# Patient Record
Sex: Male | Born: 1935
Health system: Southern US, Community
[De-identification: ages and names within clinical notes are randomized; demographics above are authoritative.]

## PROBLEM LIST (undated history)

## (undated) DIAGNOSIS — Z923 Personal history of irradiation: Secondary | ICD-10-CM

## (undated) DIAGNOSIS — F0390 Unspecified dementia without behavioral disturbance: Secondary | ICD-10-CM

## (undated) DIAGNOSIS — E7800 Pure hypercholesterolemia, unspecified: Secondary | ICD-10-CM

## (undated) DIAGNOSIS — I8390 Asymptomatic varicose veins of unspecified lower extremity: Secondary | ICD-10-CM

## (undated) DIAGNOSIS — M1990 Unspecified osteoarthritis, unspecified site: Secondary | ICD-10-CM

## (undated) DIAGNOSIS — C801 Malignant (primary) neoplasm, unspecified: Secondary | ICD-10-CM

## (undated) DIAGNOSIS — M47812 Spondylosis without myelopathy or radiculopathy, cervical region: Secondary | ICD-10-CM

## (undated) DIAGNOSIS — R531 Weakness: Secondary | ICD-10-CM

## (undated) DIAGNOSIS — Z8719 Personal history of other diseases of the digestive system: Secondary | ICD-10-CM

## (undated) DIAGNOSIS — R0789 Other chest pain: Secondary | ICD-10-CM

## (undated) DIAGNOSIS — M549 Dorsalgia, unspecified: Secondary | ICD-10-CM

## (undated) DIAGNOSIS — R42 Dizziness and giddiness: Secondary | ICD-10-CM

## (undated) DIAGNOSIS — R0602 Shortness of breath: Secondary | ICD-10-CM

## (undated) DIAGNOSIS — I447 Left bundle-branch block, unspecified: Secondary | ICD-10-CM

## (undated) DIAGNOSIS — R011 Cardiac murmur, unspecified: Secondary | ICD-10-CM

## (undated) DIAGNOSIS — R5383 Other fatigue: Secondary | ICD-10-CM

## (undated) DIAGNOSIS — Z8619 Personal history of other infectious and parasitic diseases: Secondary | ICD-10-CM

## (undated) DIAGNOSIS — R9439 Abnormal result of other cardiovascular function study: Secondary | ICD-10-CM

## (undated) DIAGNOSIS — M489 Spondylopathy, unspecified: Secondary | ICD-10-CM

## (undated) HISTORY — DX: Dizziness and giddiness: R42

## (undated) HISTORY — DX: Dorsalgia, unspecified: M54.9

## (undated) HISTORY — DX: Left bundle-branch block, unspecified: I44.7

## (undated) HISTORY — DX: Other chest pain: R07.89

## (undated) HISTORY — PX: TONSILLECTOMY AND ADENOIDECTOMY: SHX28

## (undated) HISTORY — DX: Personal history of other infectious and parasitic diseases: Z86.19

## (undated) HISTORY — DX: Other fatigue: R53.83

## (undated) HISTORY — DX: Weakness: R53.1

## (undated) HISTORY — PX: HEMORRHOID SURGERY: SHX153

## (undated) HISTORY — DX: Pure hypercholesterolemia, unspecified: E78.00

## (undated) HISTORY — DX: Unspecified osteoarthritis, unspecified site: M19.90

## (undated) HISTORY — DX: Abnormal result of other cardiovascular function study: R94.39

## (undated) HISTORY — DX: Asymptomatic varicose veins of unspecified lower extremity: I83.90

## (undated) HISTORY — DX: Spondylosis without myelopathy or radiculopathy, cervical region: M47.812

## (undated) HISTORY — DX: Spondylopathy, unspecified: M48.9

## (undated) HISTORY — DX: Shortness of breath: R06.02

---

## 1993-08-31 HISTORY — PX: BACK SURGERY: SHX140

## 1998-04-24 ENCOUNTER — Emergency Department (HOSPITAL_COMMUNITY): Admission: EM | Admit: 1998-04-24 | Discharge: 1998-04-24 | Payer: Self-pay | Admitting: Emergency Medicine

## 1998-05-01 ENCOUNTER — Emergency Department (HOSPITAL_COMMUNITY): Admission: EM | Admit: 1998-05-01 | Discharge: 1998-05-01 | Payer: Self-pay | Admitting: Emergency Medicine

## 2002-03-07 ENCOUNTER — Inpatient Hospital Stay (HOSPITAL_COMMUNITY): Admission: EM | Admit: 2002-03-07 | Discharge: 2002-03-09 | Payer: Self-pay | Admitting: Emergency Medicine

## 2002-03-07 ENCOUNTER — Encounter: Payer: Self-pay | Admitting: Emergency Medicine

## 2002-03-08 ENCOUNTER — Encounter: Payer: Self-pay | Admitting: Cardiology

## 2002-03-09 ENCOUNTER — Encounter: Payer: Self-pay | Admitting: Cardiology

## 2002-03-15 ENCOUNTER — Encounter: Payer: Self-pay | Admitting: Emergency Medicine

## 2002-03-15 ENCOUNTER — Emergency Department (HOSPITAL_COMMUNITY): Admission: EM | Admit: 2002-03-15 | Discharge: 2002-03-15 | Payer: Self-pay | Admitting: Emergency Medicine

## 2002-03-24 ENCOUNTER — Ambulatory Visit (HOSPITAL_COMMUNITY): Admission: RE | Admit: 2002-03-24 | Discharge: 2002-03-24 | Payer: Self-pay | Admitting: Infectious Diseases

## 2002-03-24 ENCOUNTER — Encounter: Payer: Self-pay | Admitting: Infectious Diseases

## 2002-03-24 ENCOUNTER — Encounter: Admission: RE | Admit: 2002-03-24 | Discharge: 2002-03-24 | Payer: Self-pay | Admitting: Infectious Diseases

## 2003-09-01 HISTORY — PX: CARDIAC CATHETERIZATION: SHX172

## 2004-04-25 DIAGNOSIS — R9439 Abnormal result of other cardiovascular function study: Secondary | ICD-10-CM

## 2004-04-25 HISTORY — DX: Abnormal result of other cardiovascular function study: R94.39

## 2004-05-01 ENCOUNTER — Inpatient Hospital Stay (HOSPITAL_BASED_OUTPATIENT_CLINIC_OR_DEPARTMENT_OTHER): Admission: RE | Admit: 2004-05-01 | Discharge: 2004-05-01 | Payer: Self-pay | Admitting: Cardiology

## 2005-07-31 ENCOUNTER — Emergency Department (HOSPITAL_COMMUNITY): Admission: EM | Admit: 2005-07-31 | Discharge: 2005-07-31 | Payer: Self-pay | Admitting: Emergency Medicine

## 2005-08-19 ENCOUNTER — Ambulatory Visit (HOSPITAL_COMMUNITY): Admission: RE | Admit: 2005-08-19 | Discharge: 2005-08-19 | Payer: Self-pay | Admitting: Cardiology

## 2007-10-12 ENCOUNTER — Encounter: Admission: RE | Admit: 2007-10-12 | Discharge: 2007-10-12 | Payer: Self-pay | Admitting: Cardiology

## 2010-01-24 ENCOUNTER — Encounter: Admission: RE | Admit: 2010-01-24 | Discharge: 2010-01-24 | Payer: Self-pay | Admitting: Cardiology

## 2010-07-22 ENCOUNTER — Ambulatory Visit: Payer: Self-pay | Admitting: Cardiology

## 2010-12-30 ENCOUNTER — Other Ambulatory Visit (INDEPENDENT_AMBULATORY_CARE_PROVIDER_SITE_OTHER): Payer: Medicare Other | Admitting: *Deleted

## 2010-12-30 ENCOUNTER — Other Ambulatory Visit: Payer: Self-pay | Admitting: Cardiology

## 2010-12-30 DIAGNOSIS — E7800 Pure hypercholesterolemia, unspecified: Secondary | ICD-10-CM

## 2010-12-30 DIAGNOSIS — E559 Vitamin D deficiency, unspecified: Secondary | ICD-10-CM

## 2010-12-30 DIAGNOSIS — Z79899 Other long term (current) drug therapy: Secondary | ICD-10-CM

## 2010-12-30 DIAGNOSIS — D689 Coagulation defect, unspecified: Secondary | ICD-10-CM

## 2010-12-30 DIAGNOSIS — Z125 Encounter for screening for malignant neoplasm of prostate: Secondary | ICD-10-CM

## 2010-12-30 LAB — CBC WITH DIFFERENTIAL/PLATELET
Basophils Absolute: 0 10*3/uL (ref 0.0–0.1)
Basophils Relative: 0.7 % (ref 0.0–3.0)
Eosinophils Relative: 6.9 % — ABNORMAL HIGH (ref 0.0–5.0)
Hemoglobin: 13.5 g/dL (ref 13.0–17.0)
Lymphocytes Relative: 22.7 % (ref 12.0–46.0)
Lymphs Abs: 1.4 10*3/uL (ref 0.7–4.0)
MCHC: 34 g/dL (ref 30.0–36.0)
MCV: 89.6 fl (ref 78.0–100.0)
Monocytes Absolute: 0.6 10*3/uL (ref 0.1–1.0)
Monocytes Relative: 10.1 % (ref 3.0–12.0)
Neutrophils Relative %: 59.6 % (ref 43.0–77.0)
RDW: 14.5 % (ref 11.5–14.6)
WBC: 6.3 10*3/uL (ref 4.5–10.5)

## 2010-12-30 LAB — LIPID PANEL
Cholesterol: 163 mg/dL (ref 0–200)
LDL Cholesterol: 103 mg/dL — ABNORMAL HIGH (ref 0–99)
Total CHOL/HDL Ratio: 4

## 2010-12-30 LAB — HEPATIC FUNCTION PANEL
AST: 22 U/L (ref 0–37)
Total Bilirubin: 0.7 mg/dL (ref 0.3–1.2)
Total Protein: 6.8 g/dL (ref 6.0–8.3)

## 2010-12-30 LAB — BASIC METABOLIC PANEL
Calcium: 9 mg/dL (ref 8.4–10.5)
Creatinine, Ser: 1 mg/dL (ref 0.4–1.5)
Potassium: 4.7 mEq/L (ref 3.5–5.1)
Sodium: 140 mEq/L (ref 135–145)

## 2010-12-30 LAB — PSA: PSA: 2.72 ng/mL (ref 0.10–4.00)

## 2011-01-02 ENCOUNTER — Ambulatory Visit: Payer: Self-pay | Admitting: Cardiology

## 2011-01-06 ENCOUNTER — Encounter: Payer: Self-pay | Admitting: *Deleted

## 2011-01-06 DIAGNOSIS — E7800 Pure hypercholesterolemia, unspecified: Secondary | ICD-10-CM

## 2011-01-06 DIAGNOSIS — R42 Dizziness and giddiness: Secondary | ICD-10-CM

## 2011-01-06 DIAGNOSIS — I447 Left bundle-branch block, unspecified: Secondary | ICD-10-CM

## 2011-01-16 NOTE — Cardiovascular Report (Signed)
NAME:  LAMIN, CHANDLEY NO.:  [id number]   MEDICAL RECORD NO.:  [id number]                   PATIENT TYPE:  OIB   LOCATION:  6501                                 FACILITY:  [facility]   PHYSICIAN:  [name]. Swaziland, M.D.               DATE OF BIRTH:  11-25-1927   DATE OF PROCEDURE:  05/01/2004  DATE OF DISCHARGE:  05/01/2004                              CARDIAC CATHETERIZATION   INDICATION FOR PROCEDURE:  A 77 year old white male with history of  hypercholesterolemia and left bundle branch block.  He presents with  symptoms of exertional fatigue, weakness and near syncope.  Adenosine  Cardiolite study was suggestive of inferior septal ischemia.   PROCEDURE:  Left heart catheterization, coronary and left ventricular  angiography.   EQUIPMENT:  4 French 4 cm right and left Judkins catheter, 4 French pigtail  catheter, 4 French arterial sheath.   MEDICATIONS:  Local anesthesia with 1% Xylocaine.   CONTRAST:  100 mL of Omnipaque.   HEMODYNAMIC DATA:  Aortic pressure 123/61 with mean of 86 mmHg.  Left  ventricular pressure was 119 with EDP of 10.   ANGIOGRAPHIC DATA:  The left coronary artery arises and distributes  normally.  The left main coronary artery is normal.   The left anterior descending artery and its branches are normal.   The left circumflex coronary artery is normal.   The right coronary artery is a dominant vessel and is normal.   LEFT VENTRICULAR ANGIOGRAPHY:  Performed in the RAO view demonstrates mild  left ventricular enlargement.  There is mild hypokinesia anteroseptal with  overall low normal left ventricular systolic function.  Ejection fraction is  estimated at 50%.   FINAL INTERPRETATION:  1.  Normal coronary anatomy.  2.  Low normal left ventricular function.                                               [name]. Swaziland, M.D.    PMJ/MEDQ  D:  05/01/2004  T:  05/02/2004  Job:  [id number]   cc:   Cassell Clement, M.D.  [address]  [place]  [place] [address]  Fax: [phone]

## 2011-01-16 NOTE — Discharge Summary (Signed)
NAME:  Jonathan Graham, STRAWSER                      ACCOUNT NO.:  [id number]   MEDICAL RECORD NO.:  [id number]                   PATIENT TYPE:  INP   LOCATION:  3703                                 FACILITY:  [facility]   PHYSICIAN:  Clovis Pu. Patty Sermons, M.D.           DATE OF BIRTH:  10/04/1927   DATE OF ADMISSION:  03/07/2002  DATE OF DISCHARGE:  03/09/2002                                 DISCHARGE SUMMARY   FINAL DIAGNOSES:  1. Fever of unknown etiology, resolved.  2. Hypotension secondary to fever and hypovolemia.  3. Old left bundle branch block.  4. Cervical spondylosis.  5. History of hypercholesterolemia.  6. Low back pain and leg pain, with exacerbation during periods of fever.   OPERATIONS PERFORMED:  Spiral CT scan of the chest.   HISTORY:  This is a 77 year old, married, Caucasian gentleman, admitted  through the emergency room.  He presented on the emergency room on the  morning of admission with severe excruciating low back pain, thigh pain, and  shaking chills.  He was initially afebrile in the emergency room and then on  retaking his temperature, it was noted that he was 102 degrees.  This was  associated with tachycardia and hypotension.  The patient has been in  generally good health all of his life.  He had felt well until about a week  prior to admission when he had fever and leg aches, but no other symptoms.  Two days later he felt better and was able to go and help attend at a  friend's funeral.  On the day after the funeral he again felt exhausted, and  over the next several days he continued to complain of weakness and malaise  but no localizing symptoms otherwise.  On the morning of admission, because  of worsening back, thigh, and leg pain, he came to the emergency room.   There is no history of any tick bite or rash.  He has had some mosquito  bites and has been doing some outside yard work creating a new series of  ponds in his back yard.  He has had no  headache or cough.  He has not had  any change in bowel habits.  He has had a decreased appetite.  He had dry  heaves after being given Dilaudid in the emergency room.  He has felt  lightheaded and presyncopal at times.   HOME MEDICATIONS:  The patient had been on Lipitor 20 mg daily, but has  taken none for the past week, and prior to that had been taking it only  sporadically.  The night prior to admission he took some quinine for leg  cramps.  He has a past history of using tetracycline for rosacea, but has  not taken any recently.   PAST MEDICAL HISTORY:  The patient has a history of known left bundle branch  block.  He had a Cardiolite Adenosine stress test October 2001,  showing  normal LV function, 54% ejection fraction, and no ischemia.  He underwent an  MRI of his cervical spine on 01/02/02 because of neck pain, and was found to  have multilevel spondylosis, and a large central spondylitic spur at C3-C4,  which produced mild central canal stenosis but no cord compression.   PHYSICAL EXAMINATION:  VITAL SIGNS:  Blood pressure is 80/50 in the right  arm, and 85/50 in the left arm.  Pulse is 100, temperature 102.  GENERAL:  He is an alert, pleasant Caucasian gentleman who is cooperative.  SKIN:  Clear. No rash. No evidence of any recent insect bites.  HEAD AND NECK:  Sclerae clear.  Conjunctivae normal, not injected.  Neck is  supple. No lymphadenopathy.  Carotids normal.  Thyroid normal.  CHEST:  Clear.  HEART:  Grade 2/6 apical systolic murmur, suggesting mitral regurgitation.  There is no gallop or rub.  ABDOMEN:  Soft and nontender.  The liver and spleen are not felt.  There is  no abdominal mass.  EXTREMITIES:  Good femoral and pedal pulses. No phlebitis, no edema.  He  does have known varicose veins.   DATA REVIEWED:  Initial workup in the emergency room included white count of  9800, with 91% polys.  Potassium was 2.7, BUN 23, blood sugar 110.  Clotting  studies were  normal.  Hematocrit was 42. CK-MB and troponins were negative.  Urine specific gravity was greater than 1.040, consistent with dehydration.  Sed rate was 11.  Blood cultures and urine cultures were ordered.   EKG showed complete left bundle branch block.   Chest x-ray showed no acute infiltrates.   The patient had already in the emergency room had an abdominal and pelvic CT  scan which were negative for abdominal aneurysm.   HOSPITAL COURSE:  The patient was admitted.  After blood and urine cultures  were drawn, he was begun empirically on IV Rocephin.  A 2D echocardiogram  was requested because of the heart murmur and fever.   By the following day he was feeling better.  He had no further shaking  chills and was afebrile. He felt stronger.  There was no rash or headache.  There were no GI or GU symptoms.  Blood pressure gradually improved, and  upright was 110/70.  Telemetry was normal, showing normal sinus rhythm and  no further tachycardia.  His white count was normal and hemoglobin was noted  to be slightly lower.  IV fluids were continued because of mild hypotension.   By 03/09/02 he was having only a low-grade fever and mild chest congestion.  His chest x-ray did show right middle lobe and left lower lobe atelectasis  on 03/08/02, but his blood and urine cultures were negative.  White count was  down to 6000 on 03/09/02, and oxygen saturation was 96% on room air.  The two-  dimensional echocardiogram showed good left ventricular function, mild  aortic sclerosis, and no evidence of vegetations.  The patient did complain  of slight pleuritic chest pain, and so prior to discharge we did a spiral CT  scan of the chest which was negative for pulmonary emboli or other  pathology.   The patient was discharged home to be followed closely on an outpatient  basis.  He was switched to Zithromax and Humibid at discharge and was also given SBE precautions because of the mild aortic stenosis  found on  echocardiogram.   LABORATORY DATA:  White count on admission 9800, on  discharge 6000.  Sedimentation rate 11. Blood sugars were 110, 131, and 137.  Liver function  studies were normal, except for albumin of 3.2 and total bilirubin of 1.6.  Cardiac enzymes were negative.  BUN on admission was 23, on discharge 8.  Lipid panel showed cholesterol of 109, triglycerides 50, HDL 31, LDL 68.  C-  reactive protein was high at 1.6 with a normal being up to 0.8.  Urinalysis  was negative except for high specific gravity of 1.040.  Blood cultures and  urine cultures were no growth.   The patient was discharged on 03/09/02, to continue quiet activity at home.  He will be on a low-cholesterol diet.  He is to use incentive spirometry  four times a day to correct the basilar atelectasis. He is to see Dr.  Patty Sermons in two weeks.   DISCHARGE MEDICATIONS:  1. Zithromax Z-Pak as directed.  2. Humibid LA 600 mg 1 twice a day.  3. Aspirin as needed for pain or discomfort.  4. Tylenol, 2 tablets as needed for pain or fever.   CONDITION ON DISCHARGE:  Improved.                                                 Thomas A. Patty Sermons, M.D.    TAB/MEDQ  D:  04/01/2002  T:  04/02/2002  Job:  [id number]

## 2011-01-16 NOTE — H&P (Signed)
[facility]  Patient:    Jonathan Graham, Jonathan Graham Visit Number: [id number] MRN: [id number]          Service Type: MED Location: [phone] Attending Physician:  Rudean Hitt Dictated by:   Clovis Pu Patty Sermons, M.D. Admit Date:  03/07/2002                           History and Physical  CHIEF COMPLAINT:  Fever and lower abdominal and leg aching.  HISTORY OF PRESENT ILLNESS:  This is a 77 year old married Caucasian gentleman admitted through the emergency room.  He presented to the emergency room this morning with severe, excruciating low back pain and thigh pain and shaking chills.  He was initially afebrile in the emergency room, and then on retaking his temperature, it spiked to 102 degrees.  This was associated with hypotension and tachycardia.  The patient had been in good health until about a week ago when he had fever and leg aches but no other symptoms.  Two days later, he felt better and was able to go to a friends funeral on July 3.  On July 4, he again felt exhausted after going out to breakfast with his wife. Over the weekend, he continued to feel weak.  This morning, because of worsening back, thigh, and leg pain, he came to the emergency room.  There is no history of any tick bite or rash.  He has been doing some outside work and has had some mosquito bites.  He has not had any headache or cough. He has had no change in bowel habits.  He has had a slight decrease in appetite.  He has had no nausea until after he was given IV Dilaudid in the emergency room, then he had brief dry heaves.  He has had brief shaking chills and felt presyncopal.  HOME MEDICATIONS:  The patient has been on Lipitor 20 mg daily but has taken none for the past week and has been somewhat sporadic in taking it.  He took some quinine last night for leg cramps.  He has a past history of tetracycline that he takes for rosacea but has not used that  recently.  ALLERGIES:  No known drug allergies.  FAMILY HISTORY:  His father died at age 46 of heart disease and stroke, and mother died at 46 of heart disease and heart failure.  He had a brother and sister killed in an auto accident.  He has two children living and well.  SOCIAL HISTORY:  He is retired.  He has his own [employer].  Recreation is working in his garden and doing some fishing.  He does not use alcohol.  He quit smoking in 1953.  He does drink several cups of coffee a day.  PAST MEDICAL HISTORY:  Includes two ruptured disks in his lower back, 1995, Dr. Otelia Sergeant.  He has a history of hemorrhoidectomy.  He has a history of hospitalization for chest pain in 1975.  He has a known left bundle branch block.  He had an adenosine Cardiolite stress test June 07, 2000, showing normal LV function and no ischemia.  Ejection fraction was 54%.  The patient had an MRI of the cervical spine on Jan 02, 2002, because of neck pain and was found to have multilevel spondylosis and a dominant finding at C3-C4 where there was a large central spondylitic spur which produced mild central canal stenosis but no  cord compression.  REVIEW OF SYSTEMS:  The remainder of the Review of Systems is negative in detail.  PHYSICAL EXAMINATION:  VITAL SIGNS:  Blood pressure 180/50 right arm, 85/50 left arm.  Pulse 100, temperature 102.  GENERAL:  This is an alert, flushed, Caucasian gentleman who is cooperative.  SKIN:  Clear.  There is no skin rash.  There is no evidence of an insect bite.  HEENT:  Pupils are equal, round and reactive to light and accommodation. Sclerae are clear.  Mouth and pharynx are normal.  NECK:  Supple.  There is no lymphadenopathy.  Carotids are normal, thyroid normal.  CHEST:  Clear to percussion and auscultation.  HEART:  Grade 2/6 apical systolic murmur suggesting mitral regurgitation. There is no gallop or rub.  ABDOMEN:  Soft and nontender.  There is no  hepatosplenomegaly or mass.  EXTREMITIES:  Femoral pulses are good.  Pedal pulses are good.  There is no phlebitis or edema.  He does have some varicose veins.  DIAGNOSTIC DATA:  Laboratory studies done in the emergency room include a white count 9800 with 91% polyps.  CMET is not yet available.  Potassium 27, BUN 23, blood sugar 110.  Arterial blood gases showed a pH 7.521.  Creatinine was 1.1.  Pro time and PTT were normal.  Hemoglobin 14, hematocrit 42.4. Total CK 52 with MB 1.6, troponin 0.02.  Urinalysis shows specific gravity greater than 1.040 but otherwise negative and no evidence of infection.  Sed rate is 11. C reactive protein pending.  Blood cultures and urine culture pending.  EKG shows complete left bundle branch block, unchanged.  Chest x-ray not yet done.  He had an abdominal and pelvic CT scan which were negative for abdominal aneurysm.  IMPRESSION: 1. Fever of unknown etiology.  Rule out viral.  Rule out subacute bacterial    endocarditis. 2. Hypotension secondary to fever and probable hypovolemia. 3. Left bundle branch block which is old. 4. Questionably new apical systolic murmur.  Rule out subacute bacterial    endocarditis. 5. Cervical spondylosis. 6. Past history of hypercholesterolemia on Lipitor but has taken none for    the past week.  DISPOSITION:  We are admitted.  Will get chest x-ray and 2-D echocardiogram in addition to studies already performed.  Blood and urine cultures have been drawn, and we will begin empirically IV Rocephin and observe response.  Follow serial white counts and clinical status. Dictated by:   Clovis Pu Patty Sermons, M.D. Attending Physician:  Rudean Hitt DD:  03/07/02 TD:  03/07/02 Job: [id number]

## 2011-01-16 NOTE — H&P (Signed)
NAME:  Jonathan Graham, Jonathan Graham NO.:  [id number]   MEDICAL RECORD NO.:  [id number]                   PATIENT TYPE:  AMB   LOCATION:                                       FACILITY:  [facility]   PHYSICIAN:  [name]. Swaziland, M.D.               DATE OF BIRTH:  Mar 24, 1928   DATE OF ADMISSION:  05/01/2004  DATE OF DISCHARGE:                                HISTORY & PHYSICAL   HISTORY OF PRESENT ILLNESS:  Jonathan Graham is a pleasant 77 year old white  male with a history of left bundle branch block.  He has been experiencing  symptoms of weakness and fatigue and feeling very faint.  These symptoms  occur primarily when he is working in his yard of walking up stairs.  He has  to sit down.  He states he has felt like he might black out but never has.  He has occasional symptoms of heart fluttering.  He has had some associated  chest tightness.  For further evaluation he underwent an adenosine  Cardiolite study.  This demonstrated evidence of inferoseptal ischemia and  an ejection fraction of 48%.  Because of these abnormal results, it has been  recommended that he undergo cardiac catheterization.  His major cardiac risk  factors include a family history of heart disease and a history of  hypercholesterolemia.   PAST MEDICAL HISTORY:  1.  Hypercholesterolemia.  2.  He has had adult onset of mononucleosis.  3.  He has varicose veins.   He has had previous surgery, including low back surgery for disk disease,  T&A, and hemorrhoidectomy.   He has no known allergies.   His current medications include:  1.  Aspirin 81 mg per day.  2.  Lipitor 20 mg per day.   SOCIAL HISTORY:  The patient is retired.  He is married.  He has two  children.  He denies tobacco or alcohol use.   His family history is strongly positive for alcohol abuse.  His father died  at age 32 after suffering two strokes.  He also had a history of myocardial  infarction.  Mother died at age 12 with  congestive heart failure and a  brother died with lung cancer and a sister who has multiple sclerosis.   REVIEW OF SYSTEMS:  He denies any edema, orthopnea, or PND.  He has no known  history of arrhythmia or murmur.  He has no recent bowel or bladder  complaints.  Other review of systems is negative.   PHYSICAL EXAMINATION:  GENERAL:  The patient is a pleasant white male in no  apparent distress.  VITAL SIGNS:  His blood pressure is 122/80, pulse 72 and regular.  His  weight is 194 pounds.  Respirations were normal at 18.  HEENT:  Normocephalic, atraumatic.  Pupils equal, round, and reactive to  light and accommodation.  Extraocular movements are full.  Oropharynx is  clear.  NECK:  Supple without JVD, adenopathy, thyromegaly, or bruit.  CHEST:  Lungs are clear to auscultation and percussion.  CARDIAC:  Regular rate and rhythm, normal S1 and S2 without gallops,  murmurs, rubs, or clicks.  ABDOMEN:  Soft, nontender.  There are no masses or hepatosplenomegaly.  EXTREMITIES:  Femoral and pedal pulses are 2+ and symmetric.  He has no  edema.  NEUROLOGIC:  Nonfocal.  SKIN:  Warm and dry.   LABORATORY DATA:  ECG shows normal sinus rhythm with left bundle branch  block.  Chest x-ray shows no active disease.   IMPRESSION:  1.  Abnormal adenosine Cardiolite study suggesting inferoseptal ischemia.  2.  Episodes of weakness and near-syncope.  3.  Hypercholesterolemia.  4.  History of mononucleosis.   PLAN:  Will proceed with cardiac catheterization with further therapy  pending these results.                                                [name]. Swaziland, M.D.    PMJ/MEDQ  D:  04/30/2004  T:  04/30/2004  Job:  [id number]   cc:   Cassell Clement, M.D.  [address], [address]  [place]  [place] [address]  Fax: [phone]

## 2011-01-23 ENCOUNTER — Ambulatory Visit (INDEPENDENT_AMBULATORY_CARE_PROVIDER_SITE_OTHER): Payer: Medicare Other | Admitting: Cardiology

## 2011-01-23 ENCOUNTER — Encounter: Payer: Self-pay | Admitting: Cardiology

## 2011-01-23 VITALS — BP 120/80 | HR 67 | Wt 181.0 lb

## 2011-01-23 DIAGNOSIS — M1711 Unilateral primary osteoarthritis, right knee: Secondary | ICD-10-CM

## 2011-01-23 DIAGNOSIS — M1710 Unilateral primary osteoarthritis, unspecified knee: Secondary | ICD-10-CM

## 2011-01-23 DIAGNOSIS — I8390 Asymptomatic varicose veins of unspecified lower extremity: Secondary | ICD-10-CM

## 2011-01-23 DIAGNOSIS — I447 Left bundle-branch block, unspecified: Secondary | ICD-10-CM

## 2011-01-23 DIAGNOSIS — E7800 Pure hypercholesterolemia, unspecified: Secondary | ICD-10-CM

## 2011-01-23 NOTE — Assessment & Plan Note (Signed)
The patient has a history of hypercholesterolemia.  He is on low dose simvastatin 5 mg daily at bedtime.  He is not having any myalgias from the simvastatin.  His blood work today is satisfactory.  His LDL is Borderline elevated.

## 2011-01-23 NOTE — Assessment & Plan Note (Signed)
Patient has a history of known left bundle-branch block.  He does not have coronary artery disease.  He had a cardiac catheterization in 2005 after a possibly abnormal Cardiolite but the hernia catheterization showed clean coronary arteries and his ejection fraction was 50%.  He has not been experiencing any chest pain dizziness or syncope.

## 2011-01-23 NOTE — Assessment & Plan Note (Signed)
The patient has a history of a large mass of varicose veins medial to the right knee.  He has an appointment to see Dr. Rockney Ghee this problem in early July.  The patient recently saw his orthopedist who gave him a steroid injection into the right knee which has greatly improved the patient's ability to ambulate without pain

## 2011-01-23 NOTE — Progress Notes (Signed)
Anselmo Rod Date of Birth:  1928-05-31 [facility] [address]   [address] [place], [place]  [address] [phone]           Fax   [phone]  History of Present Illness: This pleasant 77 year old gentleman is seen for a six-month medical checkup.  He has a history of hypercholesterolemia.  He had normal cardiac catheterization in 2005.  He does have a known left bundle-branch block with a previous false-positive adenosine Cardiolite study.The patient had an echocardiogram on 11/20/05 which showed normal systolic function with impaired relaxation moderate aortic sclerosis and mild mitral regurgitation.  Current Outpatient Prescriptions  Medication Sig Dispense Refill  . aspirin 325 MG tablet Take 325 mg by mouth 3 (three) times a week.        . Cholecalciferol (VITAMIN D PO) Take 3,000 Units by mouth daily.        [name] GLUCOSAMINE PO Take 1 tablet by mouth. Occasionally       . simvastatin (ZOCOR) 10 MG tablet Take 5 mg by mouth at bedtime.          Allergies  Allergen Reactions  . Lipitor (Atorvastatin Calcium)     cramps    Patient Active Problem List  Diagnoses  . Hypercholesterolemia  . Left bundle branch block  . Dizziness  . Osteoarthritis of right knee  . Varicose vein of leg    History  Smoking status  . Former Smoker -- 1.0 packs/day for 7 years  . Types: Cigarettes  . Quit date: 09/01/1951  Smokeless tobacco  . Not on file    History  Alcohol Use No    Family History  Problem Relation Age of Onset  . Stroke Father   . Cancer Father   . Heart failure Mother   . Heart disease Mother   . Multiple sclerosis Sister   . Lung cancer Brother     Review of Systems: Constitutional: no fever chills diaphoresis or fatigue or change in weight.  Head and neck: no hearing loss, no epistaxis, no photophobia or visual disturbance. Respiratory: No cough, shortness of breath or wheezing. Cardiovascular: No chest pain peripheral  edema, palpitations. Gastrointestinal: No abdominal distention, no abdominal pain, no change in bowel habits hematochezia or melena. Genitourinary: No dysuria, no frequency, no urgency, no nocturia. Musculoskeletal:No arthralgias, no back pain, no gait disturbance or myalgias. Neurological: No dizziness, no headaches, no numbness, no seizures, no syncope, no weakness, no tremors. Hematologic: No lymphadenopathy, no easy bruising. Psychiatric: No confusion, no hallucinations, no sleep disturbance.    Physical Exam: Filed Vitals:   01/23/11 1424  BP: 120/80  Pulse: 67  The general appearance feels a well-developed well-nourished gentleman no distress.Pupils equal and reactive.   Extraocular Movements are full.  There is no scleral icterus.  The mouth and pharynx are normal.  The neck is supple.  The carotids reveal no bruits.  The jugular venous pressure is normal.  The thyroid is not enlarged.  There is no lymphadenopathy.The chest is clear to percussion and auscultation. There are no rales or rhonchi. Expansion of the chest is symmetrical.The precordium is quiet.  The first heart sound is normal.  The second heart sound is physiologically split.  There is no murmur gallop rub or click.  There is no abnormal lift or heave.The abdomen is soft and nontender. Bowel sounds are normal. The liver and spleen are not enlarged. There Are no abdominal masses. There are no bruits. Extremities show show  a large varicose vein complex and the medial aspect of the right knee.  Good pedal pulses.  No phlebitis.  No edema..  Integument reveals psoriasis.Rectal exam reveals smooth prostate without hard nodules.  Stool is brown and benzidine negative.  EKG shows normal sinus rhythm and left bundle-branch block   Assessment / Plan: Continue same medication.  Recheck in 6 months for followup office visit and fasting lab work.

## 2011-03-11 ENCOUNTER — Encounter (INDEPENDENT_AMBULATORY_CARE_PROVIDER_SITE_OTHER): Payer: Medicare Other

## 2011-03-11 ENCOUNTER — Encounter (INDEPENDENT_AMBULATORY_CARE_PROVIDER_SITE_OTHER): Payer: Medicare Other | Admitting: Vascular Surgery

## 2011-03-11 DIAGNOSIS — I8310 Varicose veins of unspecified lower extremity with inflammation: Secondary | ICD-10-CM

## 2011-03-11 DIAGNOSIS — I83893 Varicose veins of bilateral lower extremities with other complications: Secondary | ICD-10-CM

## 2011-03-11 NOTE — Consult Note (Signed)
NEW PATIENT CONSULTATION  JOBANNY, MAVIS C DOB:  12/08/1927                                       03/11/2011 RJJOA#:[id number]  Patient presents today for evaluation of deep venous varicosities in the right leg.  He is a very pleasant 77 year old gentleman who is quite active, who has had a longstanding history for greater than 20 years of progressive varicosities in the right medial thigh and right medial calf and knee area.  Despite the large size of this, he reports minimal discomfort.  Does report some cramping sensation in this area at nighttime but otherwise no difficulty.  He does have a long history of psoriasis in the skin of his lower extremities and does have knee difficulty.  He had undergone a cortisone injection with good relief of his knee pain.  He in the future many have consideration for knee replacement but currently this is not considered.  PAST MEDICAL HISTORY:  Significant for elevated cholesterol.  He does have a history of cardiac arrhythmia.  No history of myocardial infarction. Does have a history of prior tonsillectomy and hemorrhoidectomy.  SOCIAL HISTORY:  He is married with 2 children.  He quit smoking in 1953.  Does not drink alcohol.  FAMILY HISTORY:  Significant for a myocardial infarction in his father.  REVIEW OF SYSTEMS:  No weight loss or gain.  He weighs 180 pounds.  He is 6 feet tall. CARDIAC:  Positive for arrhythmia. GI:  Hiatal hernia. GU:  Urinary frequency and difficult urination. ENT:  Change in hearing. MUSCULOSKELETAL:  Arthritis. SKIN:  Psoriasis. Review of systems is otherwise negative.  PHYSICAL EXAMINATION:  A well-developed and well-nourished white male appearing his stated age in no acute distress.  Blood pressure is 137/80, pulse 62, respirations 20.  HEENT is normal.  He has 2+ radial and 2+ dorsalis pedis pulses bilaterally.  Musculoskeletal shows no major deformity or cyanosis.  Neurologic:  No  focal weakness or paresthesias.  Skin without ulcers.  He does have diffuse psoriasis on his lower extremities.  He does not have any changes of venous hypertension.  He does have marked large varicosities throughout his medial right thigh, distal thigh, and knee.  He underwent noninvasive vascular laboratory studies in our office, and this revealed gross reflux throughout his great saphenous vein bilaterally extending into these varicosities on the right.  He does have some __________ as well.  I discussed this at length with patient and his wife present.  Despite the very large size of these, I explained that since he is not having any significant pain and does not have any complications associated with this that I would recommend any treatment.  He is comfortable with this and will notify us should he develop any difficulty or pain associated with this and if so, he would be a candidate for ablation and stab phlebectomy.  He will see [place] again on an as-needed basis.    Larina Earthly, M.D. Electronically Signed  TFE/MEDQ  D:  03/11/2011  T:  03/11/2011  Job:  [id number]  cc:   Cassell Clement, M.D. Jene Every, M.D.

## 2011-03-18 NOTE — Procedures (Unsigned)
LOWER EXTREMITY VENOUS REFLUX EXAM  INDICATION:  Right varicose veins with pain.  EXAM:  Using color-flow imaging and pulse Doppler spectral analysis, the right and left common femoral, superficial femoral, popliteal, posterior tibial, greater and lesser saphenous veins are evaluated.  There is evidence suggesting deep venous insufficiency in the right and left lower extremities..  The right and left saphenofemoral junctions are not competent with Reflux of >585milliseconds. The right and left GSV's are not competent with Reflux of >525milliseconds with the caliber as described below.  The right and left proximal short saphenous vein demonstrates competency.  GSV Diameter (used if found to be incompetent only)                                                   Right     Left Proximal Greater Saphenous Vein                   1.21 cm   0.41 cm Proximal-to-mid-thigh                             0.92 cm   0.36 cm Mid thigh                                         0.95 cm   0.36 cm Mid-distal thigh Distal thigh                                      0.84 cm   0.34 cm Knee                                              0.75 cm   0.28 cm  IMPRESSION: 1. The right and left great saphenous veins are not competent with     reflux >572milliseconds. 2. The right and left greater saphenous vein is not tortuous. 3. The deep venous system is not competent with Reflux of     >568milliseconds. 4. The right and left small saphenous vein is competent.  ___________________________________________ Larina Earthly, M.D.  EM/MEDQ  D:  03/11/2011  T:  03/11/2011  Job:  [id number]

## 2011-04-01 ENCOUNTER — Encounter: Payer: Self-pay | Admitting: Cardiology

## 2011-06-09 ENCOUNTER — Other Ambulatory Visit: Payer: Self-pay | Admitting: *Deleted

## 2011-06-09 DIAGNOSIS — E7800 Pure hypercholesterolemia, unspecified: Secondary | ICD-10-CM

## 2011-06-26 ENCOUNTER — Other Ambulatory Visit (INDEPENDENT_AMBULATORY_CARE_PROVIDER_SITE_OTHER): Payer: Medicare Other | Admitting: *Deleted

## 2011-06-26 DIAGNOSIS — E7800 Pure hypercholesterolemia, unspecified: Secondary | ICD-10-CM

## 2011-06-26 LAB — HEPATIC FUNCTION PANEL
ALT: 15 U/L (ref 0–53)
AST: 23 U/L (ref 0–37)
Albumin: 4.3 g/dL (ref 3.5–5.2)
Alkaline Phosphatase: 69 U/L (ref 39–117)
Bilirubin, Direct: 0.1 mg/dL (ref 0.0–0.3)
Total Bilirubin: 0.9 mg/dL (ref 0.3–1.2)
Total Protein: 7.3 g/dL (ref 6.0–8.3)

## 2011-06-26 LAB — BASIC METABOLIC PANEL WITH GFR
BUN: 24 mg/dL — ABNORMAL HIGH (ref 6–23)
CO2: 27 meq/L (ref 19–32)
Calcium: 9 mg/dL (ref 8.4–10.5)
Chloride: 106 meq/L (ref 96–112)
Creatinine, Ser: 1 mg/dL (ref 0.4–1.5)
GFR: 79.74 mL/min (ref >60.00)
Glucose, Bld: 85 mg/dL (ref 70–99)
Potassium: 4.2 meq/L (ref 3.5–5.1)
Sodium: 141 meq/L (ref 135–145)

## 2011-06-26 LAB — LIPID PANEL
Cholesterol: 166 mg/dL (ref 0–200)
Total CHOL/HDL Ratio: 4
Triglycerides: 87 mg/dL (ref 0.0–149.0)

## 2011-06-30 ENCOUNTER — Ambulatory Visit (INDEPENDENT_AMBULATORY_CARE_PROVIDER_SITE_OTHER): Payer: Medicare Other | Admitting: Cardiology

## 2011-06-30 ENCOUNTER — Encounter: Payer: Self-pay | Admitting: Cardiology

## 2011-06-30 VITALS — BP 102/64 | HR 64 | Ht 72.0 in | Wt 178.1 lb

## 2011-06-30 DIAGNOSIS — I447 Left bundle-branch block, unspecified: Secondary | ICD-10-CM

## 2011-06-30 DIAGNOSIS — I519 Heart disease, unspecified: Secondary | ICD-10-CM | POA: Insufficient documentation

## 2011-06-30 DIAGNOSIS — E7800 Pure hypercholesterolemia, unspecified: Secondary | ICD-10-CM

## 2011-06-30 DIAGNOSIS — Z125 Encounter for screening for malignant neoplasm of prostate: Secondary | ICD-10-CM

## 2011-06-30 NOTE — Progress Notes (Signed)
Jonathan Graham Date of Birth:  [birth year] [facility] [address]   [address] [place], [place]  [address] [phone]           Fax   [phone]  History of Present Illness: This pleasant 77 year old gentleman is seen for a scheduled followup office visit.  Her past history of hypercholesterolemia.  He has a known left bundle branch block.  He has had a previous false positive adenosine Cardiolite study.  He had a normal cardiac catheterization in 2005.  His echocardiogram on 11/20/05 showed normal systolic function with impaired relaxation, moderate aortic sclerosis, and mild mitral regurgitation.  Current Outpatient Prescriptions  Medication Sig Dispense Refill  . aspirin 325 MG tablet Take 325 mg by mouth 3 (three) times a week.        . Cholecalciferol (VITAMIN D PO) Take 3,000 Units by mouth daily.        [name] GLUCOSAMINE PO Take 1 tablet by mouth. Occasionally       . simvastatin (ZOCOR) 10 MG tablet Take 5 mg by mouth at bedtime.          Allergies  Allergen Reactions  . Lipitor (Atorvastatin Calcium)     cramps    Patient Active Problem List  Diagnoses  . Hypercholesterolemia  . Left bundle branch block  . Dizziness  . Osteoarthritis of right knee  . Varicose vein of leg    History  Smoking status  . Former Smoker -- 1.0 packs/day for 7 years  . Types: Cigarettes  . Quit date: 09/01/1951  Smokeless tobacco  . Not on file    History  Alcohol Use No    Family History  Problem Relation Age of Onset  . Stroke Father   . Cancer Father   . Heart failure Mother   . Heart disease Mother   . Multiple sclerosis Sister   . Lung cancer Brother     Review of Systems: Constitutional: no fever chills diaphoresis or fatigue or change in weight.  Head and neck: no hearing loss, no epistaxis, no photophobia or visual disturbance. Respiratory: No cough, shortness of breath or wheezing. Cardiovascular: No chest pain peripheral edema,  palpitations. Gastrointestinal: No abdominal distention, no abdominal pain, no change in bowel habits hematochezia or melena. Genitourinary: No dysuria, no frequency, no urgency, no nocturia. Musculoskeletal:No arthralgias, no back pain, no gait disturbance or myalgias. Neurological: No dizziness, no headaches, no numbness, no seizures, no syncope, no weakness, no tremors. Hematologic: No lymphadenopathy, no easy bruising. Psychiatric: No confusion, no hallucinations, no sleep disturbance.    Physical Exam: Filed Vitals:   06/30/11 0915  BP: 102/64  Pulse: 64   general appearance reveals a well-developed, well-nourished, gentleman in no distress.Pupils equal and reactive.   Extraocular Movements are full.  There is no scleral icterus.  The mouth and pharynx are normal.  The neck is supple.  The carotids reveal no bruits.  The jugular venous pressure is normal.  The thyroid is not enlarged.  There is no lymphadenopathy.  The chest is clear to percussion and auscultation. There are no rales or rhonchi. Expansion of the chest is symmetrical.  Heart reveals a soft systolic ejection murmur at the base.The abdomen is soft and nontender. Bowel sounds are normal. The liver and spleen are not enlarged. There Are no abdominal masses. There are no bruits.  The pedal pulses are good.  There is no phlebitis or edema.  There is no cyanosis or clubbing. Strength  is normal and symmetrical in all extremities.  There is no lateralizing weakness.  There are no sensory deficits.  The skin is warm and dry.  There is no rash.    Assessment / Plan: Continue same medication.  Recheck in 6 months for followup office visit and fasting lab work also, including a PSA

## 2011-06-30 NOTE — Assessment & Plan Note (Signed)
The patient has a past history of diastolic dysfunction by echo.  He has not been experiencing any symptoms of congestive heart failure or exertional dyspnea.

## 2011-06-30 NOTE — Assessment & Plan Note (Signed)
The patient has a history of hypercholesterolemia.  He has not been experiencing any side effects from the simvastatin.  Reviewed his lab work today, which is satisfactory.  Will continue same dose of simvastatin

## 2011-06-30 NOTE — Assessment & Plan Note (Signed)
The patient has not been experiencing any symptoms referable to his left bundle branch block.  He's had no dizziness or syncope.  Energy level is good.

## 2011-06-30 NOTE — Patient Instructions (Signed)
Your physician recommends that you continue on your current medications as directed. Please refer to the Current Medication list given to you today. Your physician wants you to follow-up in: 6 months with fasting labs You will receive a reminder letter in the mail two months in advance. If you don't receive a letter, please call our office to schedule the follow-up appointment.   

## 2011-07-08 ENCOUNTER — Other Ambulatory Visit: Payer: Self-pay | Admitting: Cardiology

## 2011-09-21 ENCOUNTER — Ambulatory Visit (INDEPENDENT_AMBULATORY_CARE_PROVIDER_SITE_OTHER): Payer: Medicare Other | Admitting: Family Medicine

## 2011-09-21 ENCOUNTER — Encounter: Payer: Self-pay | Admitting: Family Medicine

## 2011-09-21 VITALS — BP 105/70 | HR 71 | Temp 98.8°F | Resp 12 | Ht 70.5 in | Wt 177.0 lb

## 2011-09-21 DIAGNOSIS — M1710 Unilateral primary osteoarthritis, unspecified knee: Secondary | ICD-10-CM

## 2011-09-21 DIAGNOSIS — I519 Heart disease, unspecified: Secondary | ICD-10-CM

## 2011-09-21 DIAGNOSIS — E7800 Pure hypercholesterolemia, unspecified: Secondary | ICD-10-CM

## 2011-09-21 DIAGNOSIS — Z23 Encounter for immunization: Secondary | ICD-10-CM

## 2011-09-21 DIAGNOSIS — M1711 Unilateral primary osteoarthritis, right knee: Secondary | ICD-10-CM

## 2011-09-21 NOTE — Progress Notes (Signed)
Subjective:    Patient ID: Jonathan Graham, male    DOB: May 06, 1928, 78 y.o.   MRN: [id number]  HPI  New to establish care. Patient history of hyperlipidemia, osteoarthritis, and left bundle branch block.  Has seen cardiologist for years. No history of CAD. Previous echocardiogram reportedly showing left ventricular diastolic dysfunction. He takes aspirin and simvastatin. Lipids followed by cardiology. Remote history of smoking back in 1953. No intolerance to walking or exercise. No flu vaccine yet. Has had previous Pneumovax but unsure when.  Past Medical History  Diagnosis Date  . Hypercholesterolemia   . Atypical chest pain     history of   . Left bundle branch block   . History of mononucleosis   . Weakness     history of   . Cardiovascular stress test abnormal 04/25/2004    Abnormal adenosine Cardiolite study / evidence of inferoseptal ischemia &  an EF of 48% ""recommended that he undergo cardiac catheterization""  . Shortness of breath   . Fatigue     history of   . Cervical spine disease   . Spondylosis, cervical     at C3-C4  . Pleurisy 07/2005  . Dizziness     occasionally apon bending over  . Varicose veins   . Back pain   . Arthritis    Past Surgical History  Procedure Date  . Cardiac catheterization 2005    EF estimated at 50% /  PROCEDURE:  Left heart catheterization, coronary and left ventricular angiography /  RAO view demonstrates mild LV enlargement  / mild hypokinesia anteroseptal with  overall low normal LV systolic function / Normal coronary anatomy  . Back surgery 1995     2 ruptured discs in lower back / by Dr. Otelia Sergeant  . Tonsillectomy and adenoidectomy   . Hemorrhoid surgery     reports that he quit smoking about 60 years ago. His smoking use included Cigarettes. He has a 7 pack-year smoking history. He does not have any smokeless tobacco history on file. He reports that he does not drink alcohol or use illicit drugs. family history includes Cancer in  his father; Heart disease in his mother; Heart failure in his mother; Lung cancer in his brother; Multiple sclerosis in his sister; and Stroke in his father. Allergies  Allergen Reactions  . Lipitor (Atorvastatin Calcium)     cramps      Review of Systems  Constitutional: Negative for fever, activity change, appetite change and fatigue.  HENT: Negative for ear pain, congestion and trouble swallowing.   Eyes: Negative for pain and visual disturbance.  Respiratory: Negative for cough, shortness of breath and wheezing.   Cardiovascular: Negative for chest pain and palpitations.  Gastrointestinal: Negative for nausea, vomiting, abdominal pain, diarrhea, constipation, blood in stool, abdominal distention and rectal pain.  Genitourinary: Negative for dysuria, hematuria and testicular pain.  Musculoskeletal: Negative for joint swelling and arthralgias.  Skin: Negative for rash.  Neurological: Negative for dizziness, syncope and headaches.  Hematological: Negative for adenopathy.  Psychiatric/Behavioral: Negative for confusion and dysphoric mood.       Objective:   Physical Exam  Constitutional: He appears well-developed and well-nourished.  HENT:  Right Ear: External ear normal.  Left Ear: External ear normal.  Mouth/Throat: Oropharynx is clear and moist.  Neck: Neck supple. No thyromegaly present.  Cardiovascular: Normal rate and regular rhythm.   Pulmonary/Chest: Effort normal and breath sounds normal. No respiratory distress. He has no wheezes. He has no rales.  Musculoskeletal:  He exhibits no edema.  Lymphadenopathy:    He has no cervical adenopathy.          Assessment & Plan:  #1 hyperlipidemia. Followed by cardiology. Continue current medication. #2 history of osteoarthritis various joints. Controlled for the most part with glucosamine #3 health maintenance. Flu vaccine given #4  LBBB chronic.

## 2011-11-06 ENCOUNTER — Encounter: Payer: Self-pay | Admitting: Cardiology

## 2011-12-30 ENCOUNTER — Other Ambulatory Visit (INDEPENDENT_AMBULATORY_CARE_PROVIDER_SITE_OTHER): Payer: Medicare Other

## 2011-12-30 DIAGNOSIS — Z125 Encounter for screening for malignant neoplasm of prostate: Secondary | ICD-10-CM

## 2011-12-30 DIAGNOSIS — E7800 Pure hypercholesterolemia, unspecified: Secondary | ICD-10-CM

## 2011-12-30 LAB — BASIC METABOLIC PANEL
BUN: 20 mg/dL (ref 6–23)
CO2: 27 mEq/L (ref 19–32)
Chloride: 104 mEq/L (ref 96–112)
Creatinine, Ser: 1 mg/dL (ref 0.4–1.5)
GFR: 78.69 mL/min (ref >60.00)
Glucose, Bld: 83 mg/dL (ref 70–99)
Potassium: 4.5 mEq/L (ref 3.5–5.1)
Sodium: 139 mEq/L (ref 135–145)

## 2011-12-30 LAB — LIPID PANEL
Cholesterol: 168 mg/dL (ref 0–200)
LDL Cholesterol: 105 mg/dL — ABNORMAL HIGH (ref 0–99)
Triglycerides: 68 mg/dL (ref 0.0–149.0)
VLDL: 13.6 mg/dL (ref 0.0–40.0)

## 2011-12-30 LAB — HEPATIC FUNCTION PANEL: ALT: 14 U/L (ref 0–53)

## 2011-12-30 NOTE — Progress Notes (Signed)
Quick Note:  Please make copy of labs for patient visit. ______ 

## 2012-01-06 ENCOUNTER — Ambulatory Visit (INDEPENDENT_AMBULATORY_CARE_PROVIDER_SITE_OTHER): Payer: Medicare Other | Admitting: Cardiology

## 2012-01-06 ENCOUNTER — Encounter: Payer: Self-pay | Admitting: Cardiology

## 2012-01-06 VITALS — BP 100/70 | HR 78 | Ht 72.0 in | Wt 174.0 lb

## 2012-01-06 DIAGNOSIS — M1710 Unilateral primary osteoarthritis, unspecified knee: Secondary | ICD-10-CM

## 2012-01-06 DIAGNOSIS — I447 Left bundle-branch block, unspecified: Secondary | ICD-10-CM

## 2012-01-06 DIAGNOSIS — E7800 Pure hypercholesterolemia, unspecified: Secondary | ICD-10-CM

## 2012-01-06 DIAGNOSIS — M1711 Unilateral primary osteoarthritis, right knee: Secondary | ICD-10-CM

## 2012-01-06 NOTE — Assessment & Plan Note (Signed)
He has a chronic left bundle-branch block.  We did not do an EKG today.  He has had no further syncopal episodes and no awareness of any bradycardia

## 2012-01-06 NOTE — Assessment & Plan Note (Signed)
Patient has a history of osteoarthritis.  His symptoms have not worsened since last visit

## 2012-01-06 NOTE — Progress Notes (Signed)
Jonathan Graham Date of Birth:  10/28/1927 [facility] [address] [place], [place]  [address] [phone]         Fax   [phone]  History of Present Illness: This pleasant 78 year old gentleman is seen for a six-month followup office visit.  He has not been experiencing any new cardiac symptoms.  He does have a history of known left bundle branch block and a history of hypercholesterolemia.  Other recent problems include problems with a painful tooth and he is undergoing root canal surgery.  He is also has some difficulty hearing and just ordered a new hearing aid.  He has a past history of syncope but has had no recent syncopal episodes.  He denies any genitourinary symptoms.  His PSA is elevated today.  His PSA has: From 2.72 up to 4.43 since last year.  He does not have a urologist and we will refer him to Dr. Isabel Caprice or colleagues.  Current Outpatient Prescriptions  Medication Sig Dispense Refill  . aspirin 325 MG tablet Take 325 mg by mouth 3 (three) times a week.        . Cholecalciferol (VITAMIN D PO) Take 3,000 Units by mouth daily.        [name] GLUCOSAMINE PO Take 1 tablet by mouth daily. Occasionally      . simvastatin (ZOCOR) 10 MG tablet TAKE 1/2 TABLET BY MOUTH DAILY  15 tablet  12    Allergies  Allergen Reactions  . Lipitor (Atorvastatin Calcium)     cramps    Patient Active Problem List  Diagnoses  . Hypercholesterolemia  . Left bundle branch block  . Dizziness  . Osteoarthritis of right knee  . Varicose vein of leg  . Echocardiogram shows left ventricular diastolic dysfunction    History  Smoking status  . Former Smoker -- 1.0 packs/day for 7 years  . Types: Cigarettes  . Quit date: 09/01/1951  Smokeless tobacco  . Not on file    History  Alcohol Use No    Family History  Problem Relation Age of Onset  . Stroke Father   . Cancer Father   . Heart failure Mother   . Heart disease Mother   . Multiple sclerosis Sister   .  Lung cancer Brother     Review of Systems: Constitutional: no fever chills diaphoresis or fatigue or change in weight.  Head and neck: no hearing loss, no epistaxis, no photophobia or visual disturbance. Respiratory: No cough, shortness of breath or wheezing. Cardiovascular: No chest pain peripheral edema, palpitations. Gastrointestinal: No abdominal distention, no abdominal pain, no change in bowel habits hematochezia or melena. Genitourinary: No dysuria, no frequency, no urgency, no nocturia. Musculoskeletal:No arthralgias, no back pain, no gait disturbance or myalgias. Neurological: No dizziness, no headaches, no numbness, no seizures, no syncope, no weakness, no tremors. Hematologic: No lymphadenopathy, no easy bruising. Psychiatric: No confusion, no hallucinations, no sleep disturbance.    Physical Exam: Filed Vitals:   01/06/12 1134  BP: 100/70  Pulse: 78   the general appearance reveals a well-developed elderly gentleman in no distress.The head and neck exam reveals pupils equal and reactive.  Extraocular movements are full.  There is no scleral icterus.  The mouth and pharynx are normal.  The neck is supple.  The carotids reveal no bruits.  The jugular venous pressure is normal.  The  thyroid is not enlarged.  There is no lymphadenopathy.  The chest is clear to percussion and auscultation.  There  are no rales or rhonchi.  Expansion of the chest is symmetrical.  The precordium is quiet.  The first heart sound is normal.  The second heart sound is physiologically split.  There is no murmur gallop rub or click.  There is no abnormal lift or heave.  The abdomen is soft and nontender.  The bowel sounds are normal.  The liver and spleen are not enlarged.  There are no abdominal masses.  There are no abdominal bruits.  Extremities reveal good pedal pulses.  There is no phlebitis or edema.  There is no cyanosis or clubbing.  Strength is normal and symmetrical in all extremities.  There is no  lateralizing weakness.  There are no sensory deficits.  The skin is warm and dry.  There is no rash.     Assessment / Plan: Continue same medication.  Arrange for urology evaluation of elevated PSA.  Recheck in 6 months for followup office visit EKG and fasting lab work.

## 2012-01-06 NOTE — Patient Instructions (Addendum)
Your physician recommends that you continue on your current medications as directed. Please refer to the Current Medication list given to you today.  Your physician wants you to follow-up in: 6 month  You will receive a reminder letter in the mail two months in advance. If you don't receive a letter, please call our office to schedule the follow-up appointment.   Appointment with Dr Isabel Caprice 02/01/2012 at 2:45 pm.  Arrive at 2:30, fill out information you should be receiving and bring with you to office visit.  You will also need to take a list of your medications, insurance cards, and a photo ID.

## 2012-01-06 NOTE — Assessment & Plan Note (Signed)
The patient is on simvastatin 5 mg daily.  Larger doses cause him to have leg cramps.  His cholesterol levels are acceptable at the present dosage.  The patient does not have any history of ischemic heart disease.  He had cardiac catheterization in 2005 by Dr. [place] which showed normal coronary arteries and an ejection fraction of 50%

## 2012-07-14 ENCOUNTER — Other Ambulatory Visit (INDEPENDENT_AMBULATORY_CARE_PROVIDER_SITE_OTHER): Payer: Medicare Other

## 2012-07-14 DIAGNOSIS — E7800 Pure hypercholesterolemia, unspecified: Secondary | ICD-10-CM

## 2012-07-14 LAB — HEPATIC FUNCTION PANEL
ALT: 13 U/L (ref 0–53)
Albumin: 4.2 g/dL (ref 3.5–5.2)
Bilirubin, Direct: 0.1 mg/dL (ref 0.0–0.3)
Total Bilirubin: 0.6 mg/dL (ref 0.3–1.2)

## 2012-07-14 LAB — BASIC METABOLIC PANEL
Calcium: 8.7 mg/dL (ref 8.4–10.5)
GFR: 66.57 mL/min (ref >60.00)
Glucose, Bld: 89 mg/dL (ref 70–99)
Potassium: 4.1 mEq/L (ref 3.5–5.1)
Sodium: 139 mEq/L (ref 135–145)

## 2012-07-14 LAB — LIPID PANEL
Cholesterol: 167 mg/dL (ref 0–200)
HDL: 46.2 mg/dL (ref >39.00)
LDL Cholesterol: 109 mg/dL — ABNORMAL HIGH (ref 0–99)
Total CHOL/HDL Ratio: 4
Triglycerides: 61 mg/dL (ref 0.0–149.0)
VLDL: 12.2 mg/dL (ref 0.0–40.0)

## 2012-07-14 NOTE — Progress Notes (Signed)
Quick Note:  Please make copy of labs for patient visit. ______ 

## 2012-07-21 ENCOUNTER — Ambulatory Visit (INDEPENDENT_AMBULATORY_CARE_PROVIDER_SITE_OTHER): Payer: Medicare Other | Admitting: Cardiology

## 2012-07-21 ENCOUNTER — Encounter: Payer: Self-pay | Admitting: Cardiology

## 2012-07-21 VITALS — BP 128/62 | HR 118 | Resp 18 | Ht 72.0 in | Wt 177.1 lb

## 2012-07-21 DIAGNOSIS — E7800 Pure hypercholesterolemia, unspecified: Secondary | ICD-10-CM

## 2012-07-21 DIAGNOSIS — I350 Nonrheumatic aortic (valve) stenosis: Secondary | ICD-10-CM

## 2012-07-21 DIAGNOSIS — I358 Other nonrheumatic aortic valve disorders: Secondary | ICD-10-CM

## 2012-07-21 DIAGNOSIS — R011 Cardiac murmur, unspecified: Secondary | ICD-10-CM

## 2012-07-21 DIAGNOSIS — I447 Left bundle-branch block, unspecified: Secondary | ICD-10-CM

## 2012-07-21 DIAGNOSIS — I359 Nonrheumatic aortic valve disorder, unspecified: Secondary | ICD-10-CM

## 2012-07-21 NOTE — Assessment & Plan Note (Signed)
The patient has a known aortic valve systolic murmur.  His last echocardiogram was in 2003 and at that time showed very mild aortic stenosis.  His ejection fraction was 55-65%.  We will plan to update his echo.

## 2012-07-21 NOTE — Patient Instructions (Addendum)
Your physician has requested that you have an echocardiogram. Echocardiography is a painless test that uses sound waves to create images of your heart. It provides your doctor with information about the size and shape of your heart and how well your heart's chambers and valves are working. This procedure takes approximately one hour. There are no restrictions for this procedure.  Your physician recommends that you continue on your current medications as directed. Please refer to the Current Medication list given to you today.  Your physician wants you to follow-up in: 6 months with fasting labs (lp/bmet/hfp)  You will receive a reminder letter in the mail two months in advance. If you don't receive a letter, please call our office to schedule the follow-up appointment.  

## 2012-07-21 NOTE — Assessment & Plan Note (Signed)
Patient has a history of known left bundle branch block.  He has not been having any dizziness or syncope.

## 2012-07-21 NOTE — Assessment & Plan Note (Signed)
The patient is on simvastatin 10 mg daily for his hypercholesterolemia.  Lab work is satisfactory.. is not having any myalgias.

## 2012-07-21 NOTE — Progress Notes (Signed)
Jonathan Graham Date of Birth:  Dec 27, 1927 [facility] [address] [place], [place]  [address] [phone]         Fax   [phone]  History of Present Illness: This pleasant 78 year old gentleman is seen for a six-month followup office visit. He has not been experiencing any new cardiac symptoms. He does have a history of known left bundle branch block and a history of hypercholesterolemia.  He has a past history of syncope but has had no recent syncopal episodes. He denies any genitourinary symptoms.  On his last visit his PSA had risen to above 4.0 and we referred him to urology who recommended watchful waiting and no specific therapy at this point.  They will continue to follow.   Current Outpatient Prescriptions  Medication Sig Dispense Refill  . aspirin 325 MG tablet Take 325 mg by mouth 3 (three) times a week.        [name] GLUCOSAMINE PO Take 1 tablet by mouth daily. Occasionally      . simvastatin (ZOCOR) 10 MG tablet TAKE 1/2 TABLET BY MOUTH DAILY  15 tablet  12  . Cholecalciferol (VITAMIN D PO) Take 3,000 Units by mouth daily.          Allergies  Allergen Reactions  . Lipitor (Atorvastatin Calcium)     cramps    Patient Active Problem List  Diagnosis  . Hypercholesterolemia  . Left bundle branch block  . Dizziness  . Osteoarthritis of right knee  . Varicose vein of leg  . Echocardiogram shows left ventricular diastolic dysfunction    History  Smoking status  . Former Smoker -- 1.0 packs/day for 7 years  . Types: Cigarettes  . Quit date: 09/01/1951  Smokeless tobacco  . Not on file    History  Alcohol Use No    Family History  Problem Relation Age of Onset  . Stroke Father   . Cancer Father   . Heart failure Mother   . Heart disease Mother   . Multiple sclerosis Sister   . Lung cancer Brother     Review of Systems: Constitutional: no fever chills diaphoresis or fatigue or change in weight.  Head and neck: no hearing loss,  no epistaxis, no photophobia or visual disturbance. Respiratory: No cough, shortness of breath or wheezing. Cardiovascular: No chest pain peripheral edema, palpitations. Gastrointestinal: No abdominal distention, no abdominal pain, no change in bowel habits hematochezia or melena. Genitourinary: No dysuria, no frequency, no urgency, no nocturia. Musculoskeletal:No arthralgias, no back pain, no gait disturbance or myalgias. Neurological: No dizziness, no headaches, no numbness, no seizures, no syncope, no weakness, no tremors. Hematologic: No lymphadenopathy, no easy bruising. Psychiatric: No confusion, no hallucinations, no sleep disturbance.    Physical Exam: Filed Vitals:   07/21/12 1537  BP: 128/62  Pulse: 118  Resp: 18   the general appearance reveals a well-developed well-nourished gentleman in no distress.The head and neck exam reveals pupils equal and reactive.  Extraocular movements are full.  There is no scleral icterus.  The mouth and pharynx are normal.  The neck is supple.  The carotids reveal no bruits.  The jugular venous pressure is normal.  The  thyroid is not enlarged.  There is no lymphadenopathy.  The chest is clear to percussion and auscultation.  There are no rales or rhonchi.  Expansion of the chest is symmetrical.  The precordium is quiet.  The first heart sound is normal.  The second heart sound is physiologically  split.  There is no murmur gallop rub or click.  There is no abnormal lift or heave.  The abdomen is soft and nontender.  The bowel sounds are normal.  The liver and spleen are not enlarged.  There are no abdominal masses.  There are no abdominal bruits.  Extremities reveal good pedal pulses.  There is no phlebitis or edema.  There is no cyanosis or clubbing.  Strength is normal and symmetrical in all extremities.  There is no lateralizing weakness.  There are no sensory deficits.  The skin is warm and dry.  There is no rash.  EKG shows normal sinus rhythm with  marked sinus arrhythmia and occasional PVCs and there is left bundle branch block which is unchanged since 01/23/11   Assessment / Plan: Continue same medication.  Recheck in 6 months for followup office visit lipid panel hepatic function panel and basal metabolic panel.  He will return soon for a followup echocardiogram to assess his valve.

## 2012-07-26 ENCOUNTER — Ambulatory Visit (HOSPITAL_COMMUNITY): Payer: Medicare Other | Attending: Cardiology | Admitting: Radiology

## 2012-07-26 DIAGNOSIS — I359 Nonrheumatic aortic valve disorder, unspecified: Secondary | ICD-10-CM | POA: Insufficient documentation

## 2012-07-26 DIAGNOSIS — I4949 Other premature depolarization: Secondary | ICD-10-CM | POA: Insufficient documentation

## 2012-07-26 DIAGNOSIS — R42 Dizziness and giddiness: Secondary | ICD-10-CM | POA: Insufficient documentation

## 2012-07-26 DIAGNOSIS — Z87891 Personal history of nicotine dependence: Secondary | ICD-10-CM | POA: Insufficient documentation

## 2012-07-26 DIAGNOSIS — I517 Cardiomegaly: Secondary | ICD-10-CM | POA: Insufficient documentation

## 2012-07-26 DIAGNOSIS — E7800 Pure hypercholesterolemia, unspecified: Secondary | ICD-10-CM | POA: Insufficient documentation

## 2012-07-26 DIAGNOSIS — I350 Nonrheumatic aortic (valve) stenosis: Secondary | ICD-10-CM

## 2012-07-26 DIAGNOSIS — R011 Cardiac murmur, unspecified: Secondary | ICD-10-CM | POA: Insufficient documentation

## 2012-07-26 DIAGNOSIS — I447 Left bundle-branch block, unspecified: Secondary | ICD-10-CM | POA: Insufficient documentation

## 2012-07-26 NOTE — Progress Notes (Signed)
Echocardiogram performed.  

## 2012-08-04 ENCOUNTER — Telehealth: Payer: Self-pay | Admitting: Cardiology

## 2012-08-04 NOTE — Telephone Encounter (Signed)
Message copied by Burnell Blanks on Thu Aug 04, 2012  3:15 PM ------      Message from: Cassell Clement      Created: Fri Jul 29, 2012 10:02 PM       Please report.  The aortic stenosis is only mild.  Continue same meds.

## 2012-08-04 NOTE — Telephone Encounter (Signed)
New problem:  Test results.  

## 2012-08-04 NOTE — Telephone Encounter (Signed)
Will forward to his nurse.

## 2012-08-04 NOTE — Telephone Encounter (Signed)
Advised of echo  

## 2012-08-08 ENCOUNTER — Other Ambulatory Visit: Payer: Self-pay | Admitting: *Deleted

## 2012-08-08 MED ORDER — SIMVASTATIN 10 MG PO TABS: 5.0000 mg | ORAL_TABLET | Freq: Every day | ORAL | Status: DC

## 2012-08-25 HISTORY — PX: PROSTATE BIOPSY: SHX241

## 2012-08-31 DIAGNOSIS — C801 Malignant (primary) neoplasm, unspecified: Secondary | ICD-10-CM

## 2012-08-31 HISTORY — DX: Malignant (primary) neoplasm, unspecified: C80.1

## 2012-09-08 ENCOUNTER — Other Ambulatory Visit (HOSPITAL_COMMUNITY): Payer: Self-pay | Admitting: Urology

## 2012-09-08 DIAGNOSIS — C61 Malignant neoplasm of prostate: Secondary | ICD-10-CM

## 2012-09-16 ENCOUNTER — Encounter (HOSPITAL_COMMUNITY): Payer: Medicare Other

## 2012-10-15 ENCOUNTER — Other Ambulatory Visit: Payer: Self-pay

## 2012-10-19 ENCOUNTER — Ambulatory Visit
Admission: RE | Admit: 2012-10-19 | Discharge: 2012-10-19 | Disposition: A | Discharge status: Home / Self Care | Payer: Medicare Other | Source: Ambulatory Visit | Attending: Radiation Oncology | Admitting: Radiation Oncology

## 2012-10-19 ENCOUNTER — Encounter: Payer: Self-pay | Admitting: Radiation Oncology

## 2012-10-19 VITALS — BP 113/67 | HR 60 | Temp 97.5°F | Wt 177.4 lb

## 2012-10-19 DIAGNOSIS — C61 Malignant neoplasm of prostate: Secondary | ICD-10-CM | POA: Insufficient documentation

## 2012-10-19 NOTE — Progress Notes (Signed)
[facility] Radiation Oncology NEW PATIENT EVALUATION  Name: Jonathan Graham MRN: [id number]  Date:   10/19/2012           DOB: May 12, 1928  Status: outpatient   CC: Jonathan Clement, MD  Jonathan Fuller, MD    REFERRING PHYSICIAN: Valetta Fuller, MD   DIAGNOSIS: Stage TI C. 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 male who is seen today for the courtesy Dr. Barron Alvine for discussion of possible radiation therapy in the management of his stage TI C. intermediate risk adenocarcinoma prostate. He was noted to have a rise in his PSA from 2.7 in May 2012, up to 4.4 by May 2013, and most recently 7.8 in November of 2013. He was referred to Dr. Patty Sermons to Dr. Isabel Caprice for further evaluation. He underwent ultrasound-guided biopsies on 08/25/2012. Dr. Ellin Goodie procedure note mentions 6 biopsies from the right and 6 biopsies from the left, but in the pathology report there is one biopsy of Gleason 6 (3+3) involving 1 of 3 biopsies on the right and 5 biopsies on the left containing carcinoma with one biospy having a Gleason score of 7 (4+3) involving 80% of one core and 3 cores with Gleason 7 (3+4) involving 30%, 60%, and 80%  and one core Gleason 6 (3+3) involving 10% of one core. His gland volume was approximately 32 cc. He does have moderate urinary obstructive symptomatology with an I PSS score of 18. No GI difficulties although he does have occasional constipation. He does have erectile dysfunction which began last year. His staging workup included a CT scan of the abdomen/pelvis which was without evidence for metastatic disease. He is felt to have a mildly enlarged prostate with mild asymmetric soft tissue fullness in the left peripheral zone. Seminal vesicles were negative. There were no pathologically enlarged lymph nodes. A bone scan was not performed since it was denied by his insurance company because of his PSA under  10. He is rather active for his age. Of note is he does have chronic right hip pain.   PREVIOUS RADIATION THERAPY: No   PAST MEDICAL HISTORY:  has a past medical history of Hypercholesterolemia; Atypical chest pain; Left bundle branch block; History of mononucleosis; Weakness; Cardiovascular stress test abnormal (04/25/2004); Shortness of breath; Fatigue; Cervical spine disease; Spondylosis, cervical; Pleurisy (07/2005); Dizziness; Varicose veins; Back pain; and Arthritis.     PAST SURGICAL HISTORY:  Past Surgical History  Procedure Laterality Date  . Cardiac catheterization  2005    EF estimated at 50% /  PROCEDURE:  Left heart catheterization, coronary and left ventricular angiography /  RAO view demonstrates mild LV enlargement  / mild hypokinesia anteroseptal with  overall low normal LV systolic function / Normal coronary anatomy  . Back surgery  1995     2 ruptured discs in lower back / by Dr. Otelia Sergeant  . Tonsillectomy and adenoidectomy    . Hemorrhoid surgery    . Prostate biopsy  08/25/2012    Gleason 3+3=6 PSA 7.80     FAMILY HISTORY: family history includes Cancer in his father; Heart disease in his mother; Heart failure in his mother; Lung cancer in his brother; Multiple sclerosis in his sister; and Stroke in his father. His father died at age 73 following a stroke. He also had cardiac disease. He had a history of appendiceal carcinoma. His mother died from cardiac disease and 32. No family history prostate  cancer.   SOCIAL HISTORY:  reports that he quit smoking about 61 years ago. His smoking use included Cigarettes. He has a 7 pack-year smoking history. He does not have any smokeless tobacco history on file. He reports that he does not drink alcohol or use illicit drugs. Married, 2 children. 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   ALLERGIES: Lipitor   MEDICATIONS:  Current Outpatient Prescriptions  Medication Sig Dispense Refill  . aspirin 325 MG tablet Take 325 mg by mouth  3 (three) times a week.        . simvastatin (ZOCOR) 10 MG tablet Take 0.5 tablets (5 mg total) by mouth at bedtime.  15 tablet  6   No current facility-administered medications for this encounter.     REVIEW OF SYSTEMS:  Pertinent items are noted in HPI.    PHYSICAL EXAM:  weight is 177 lb 6.4 oz (80.468 kg). His temperature is 97.5 F (36.4 C). His blood pressure is 113/67 and his pulse is 60. His oxygen saturation is 99%.   Alert and oriented 79 year old white male appearing younger than his stated age. Head and neck examination: Grossly unremarkable. Nodes: Without palpable cervical or supraclavicular lymphadenopathy. Chest: Lungs clear. Heart: Regular rate and rhythm. Back: Without spinal or CVA tenderness. Abdomen: Without masses organomegaly. Genitalia: Unremarkable to inspection. Rectal: The prostate gland is normal in size and is without focal induration or nodularity. Extremities: Without edema. Neurologic examination: Grossly nonfocal.   LABORATORY DATA:  Lab Results  Component Value Date   WBC 6.3 12/30/2010   HGB 13.5 12/30/2010   HCT 39.6 12/30/2010   MCV 89.6 12/30/2010   PLT 231.0 12/30/2010   Lab Results  Component Value Date   NA 139 07/14/2012   K 4.1 07/14/2012   CL 105 07/14/2012   CO2 26 07/14/2012   Lab Results  Component Value Date   ALT 13 07/14/2012   AST 18 07/14/2012   ALKPHOS 80 07/14/2012   BILITOT 0.6 07/14/2012   PSA 7.8 from 07/26/2012    IMPRESSION: Stage TI C. intermediate risk adenocarcinoma prostate. His prognosis is related to his stage, PSA level, and Gleason score. His stage and PSA level are favorable while his Gleason score is of intermediate favorability. Other prognostic factors include PSA doubling time and also disease volume. He has moderate disease volume and a doubling time of approximately one to 2 years. Altogether he has intermediate risk disease. I explained to the patient and his wife that his management options include close  surveillance, cryotherapy, or radiation therapy. Radiation therapy options include external beam/IMRT. He is not a candidate for seed implantation as a boost based on his obstructive symptomatology. We discussed the potential acute and late toxicities of radiation therapy. I think it would reasonable to start him on an alpha blocker and see if his urinary obstruction symptoms improved. I explained that 8 weeks of external beam/IMRT is quite time consuming, and if this is an issue, cryotherapy would be more convenient. He is rather fit with an excellent performance status for his age, and it was certainly be willing to offer him external beam/IMRT with curative intent. He knows that he would have to have placement of 3 gold seed markers for image guidance. We also discussed the possibility of short term androgen deprivation, but I think this would have more toxicity in terms of fatigue and significantly affect his quality of life. This is currently being studied by the [facility], but is being used off protocol.  PLAN: As discussed above. He'll be back in touch with Dr. Isabel Caprice after digesting his treatment options. Lastly, I think it would be reasonable to consider an alpha blocker in the near future.   I spent 60 minutes minutes face to face with the patient and more than 50% of that time was spent in counseling and/or coordination of care.

## 2012-10-19 NOTE — Progress Notes (Signed)
Patient and wife to be seen today for second opinion regarding prostate cancer treatment options.Currently interested in cryotherapy. PSA 7.80 on 07/26/2012 Multi-grade gleason of 6 and 7 Prostate gland 32 grams.

## 2012-10-19 NOTE — Progress Notes (Signed)
Please see the Nurse Progress Note in the MD Initial Consult Encounter for this patient. 

## 2012-10-20 ENCOUNTER — Encounter: Payer: Self-pay | Admitting: Radiation Oncology

## 2012-10-20 NOTE — Addendum Note (Signed)
Encounter addended by: Tessa Lerner, RN on: 10/20/2012  9:40 AM<BR>     Documentation filed: Charges VN

## 2012-10-20 NOTE — Progress Notes (Signed)
Chart note: [name] called me today and he wants to proceed with external beam/IMRT. Will kindly asked Dr. Isabel Caprice to placed 3 gold seed markers and then have him return for simulation/treatment planning.

## 2012-10-20 NOTE — Addendum Note (Signed)
Encounter addended by: Maryln Gottron, MD on: 10/20/2012  3:06 PM<BR>     Documentation filed: Normajean Glasgow VN

## 2012-10-21 ENCOUNTER — Telehealth: Payer: Self-pay | Admitting: *Deleted

## 2012-10-21 NOTE — Telephone Encounter (Signed)
Received phone call from [facility] and they gave another appt. For Jonathan Graham to have gold seed placement on March 24 at 1:00 pm at the urologist's office, notified Dr. Dayton Scrape via voice mail

## 2012-10-21 NOTE — Telephone Encounter (Signed)
CALLED TO ARRANGE GOLD SEED PLACEMENT FOR THIS PT. WAS GIVEN 12-27-12 AT 3:00 PM AT DR. Ellin Goodie OFFICE, LVM FOR DR. Ellin Goodie OFFICE FOR ANOTHER DAY AND TIME, NOTIFIED DR. Dayton Scrape VIA VOICE MAIL

## 2012-10-24 ENCOUNTER — Telehealth: Payer: Self-pay | Admitting: *Deleted

## 2012-10-24 NOTE — Telephone Encounter (Signed)
CALLED PATIENT TO INFORM OF SIM APPT. FOR 11-23-12 AT 9:00 AM AT DR. MURRAY'S OFFICE, LVM FOR A RETURN CALL.

## 2012-11-10 ENCOUNTER — Telehealth: Payer: Self-pay | Admitting: Radiation Oncology

## 2012-11-10 ENCOUNTER — Other Ambulatory Visit: Payer: Self-pay | Admitting: Radiation Oncology

## 2012-11-10 MED ORDER — TAMSULOSIN HCL 0.4 MG PO CAPS: 0.4000 mg | ORAL_CAPSULE | Freq: Every day | ORAL | Status: DC

## 2012-11-10 NOTE — Telephone Encounter (Signed)
Flomax e-prescribed.

## 2012-11-22 ENCOUNTER — Telehealth: Payer: Self-pay | Admitting: *Deleted

## 2012-11-22 NOTE — Telephone Encounter (Signed)
Called patient to remind of sim appt. For 11-23-12, lvm for a return call

## 2012-11-23 ENCOUNTER — Ambulatory Visit
Admission: RE | Admit: 2012-11-23 | Discharge: 2012-11-23 | Disposition: A | Discharge status: Home / Self Care | Payer: Medicare Other | Source: Ambulatory Visit | Attending: Radiation Oncology | Admitting: Radiation Oncology

## 2012-11-23 ENCOUNTER — Telehealth: Payer: Self-pay | Admitting: Radiation Oncology

## 2012-11-23 DIAGNOSIS — R350 Frequency of micturition: Secondary | ICD-10-CM | POA: Insufficient documentation

## 2012-11-23 DIAGNOSIS — R3911 Hesitancy of micturition: Secondary | ICD-10-CM | POA: Insufficient documentation

## 2012-11-23 DIAGNOSIS — C61 Malignant neoplasm of prostate: Secondary | ICD-10-CM | POA: Insufficient documentation

## 2012-11-23 DIAGNOSIS — R300 Dysuria: Secondary | ICD-10-CM | POA: Insufficient documentation

## 2012-11-23 DIAGNOSIS — Z510 Encounter for antineoplastic radiation therapy: Secondary | ICD-10-CM | POA: Insufficient documentation

## 2012-11-23 DIAGNOSIS — K5900 Constipation, unspecified: Secondary | ICD-10-CM | POA: Insufficient documentation

## 2012-11-23 DIAGNOSIS — R39198 Other difficulties with micturition: Secondary | ICD-10-CM | POA: Insufficient documentation

## 2012-11-23 DIAGNOSIS — R3915 Urgency of urination: Secondary | ICD-10-CM | POA: Insufficient documentation

## 2012-11-23 NOTE — Telephone Encounter (Signed)
Met w patient to discuss RO billing. Pt had no financial concerns today.  Dx: Malignant neoplasm of prostate - Primary 185 Prostate cancer 185  Attending Rad: RM  Rad Tx: IMRT

## 2012-11-24 NOTE — Progress Notes (Signed)
Simulation/treatment planning note: The patient was taken to the CT simulator. A VAC LOC immobilization device was constructed. A red rubber tube was placed within the rectal vault. I attempted to catheterize him that I could only obtain a urethrogram. He was then scanned. I contoured his prostate, seminal vesicles, rectum, bladder, and rectosigmoid colon. I'm prescribing 7800 cGy in 40 sessions to his prostate PTV which represents the prostate was 0.8 cm except for 0.5 cm along the rectum. I prescribing 5600 cGy in 40 sessions to his seminal vesicle PTV which represents the seminal vesicles by 0.5 cm. He is now ready for IMRT simulation/treatment planning.

## 2012-11-28 ENCOUNTER — Encounter: Payer: Self-pay | Admitting: Radiation Oncology

## 2012-11-28 NOTE — Progress Notes (Signed)
IMRT simulation/treatment planning note: The patient completed his IMRT simulation/treatment planning in the management of his carcinoma the prostate. IMRT was chosen to decrease the risk for both acute and late bladder and rectal toxicity compared to conventional or 3-D conformal radiation therapy. Dose volume histograms were obtained for the target structures including the prostate and seminal vesicles and also avoidance structures including the bladder, rectum, and femoral heads. We met our departmental guidelines with respect to target coverage and dose to avoidance structures. I'm prescribing 7800 cGy in 40 sessions to his prostate PTV which represents the prostate was 0.8 cm except for 0.5 cm along the rectum. I prescribing 5600 cGy in 40 sessions to his seminal vesicle PTV which are presents his seminal vesicles was 0.5 cm. I requesting daily image guidance with KV imaging and also weekly cone beam CT scan. He'll be treated with a comfortably full bladder.

## 2012-12-05 ENCOUNTER — Ambulatory Visit
Admission: RE | Admit: 2012-12-05 | Discharge: 2012-12-05 | Disposition: A | Discharge status: Home / Self Care | Payer: Medicare Other | Source: Ambulatory Visit | Attending: Radiation Oncology | Admitting: Radiation Oncology

## 2012-12-05 VITALS — BP 126/80 | HR 67 | Temp 97.6°F | Ht 72.0 in | Wt 178.2 lb

## 2012-12-05 DIAGNOSIS — C61 Malignant neoplasm of prostate: Secondary | ICD-10-CM

## 2012-12-05 NOTE — Progress Notes (Signed)
Please see document from earlier today for his weekly management note.

## 2012-12-05 NOTE — Progress Notes (Signed)
Jonathan Graham is here for put accompanied by his wife.  He has had 1/40 treatments to his prostate.  He denies pain, fatigue, urinary frequency and hematuria.  He states that he does have a slow urinary stream and gets up 3 times a night to urinate.  He was giving the Radiation Therapy and You booklet and educated on the potential side effects of radiation including skin changes, fatigue, diarrhea and bladder changes.

## 2012-12-05 NOTE — Progress Notes (Signed)
Weekly Management Note:  Site: Prostate Current Dose:  195  cGy Projected Dose: 7800  cGy  Narrative: The patient is seen today for routine under treatment assessment. CBCT/MVCT images/port films were reviewed. The chart was reviewed.   Bladder filling satisfactory today. No new GU or GI difficulties. He does have episodes of constipation, and sometimes does not move his bowels for to 3 days. I instructed him to consider MiraLax.  Physical Examination: There were no vitals filed for this visit..  Weight:  . No change.  Impression: Tolerating radiation therapy well.  Plan: Continue radiation therapy as planned.

## 2012-12-06 ENCOUNTER — Ambulatory Visit
Admission: RE | Admit: 2012-12-06 | Discharge: 2012-12-06 | Disposition: A | Discharge status: Home / Self Care | Payer: Medicare Other | Source: Ambulatory Visit | Attending: Radiation Oncology | Admitting: Radiation Oncology

## 2012-12-07 ENCOUNTER — Ambulatory Visit
Admission: RE | Admit: 2012-12-07 | Discharge: 2012-12-07 | Disposition: A | Discharge status: Home / Self Care | Payer: Medicare Other | Source: Ambulatory Visit | Attending: Radiation Oncology | Admitting: Radiation Oncology

## 2012-12-08 ENCOUNTER — Ambulatory Visit
Admission: RE | Admit: 2012-12-08 | Discharge: 2012-12-08 | Disposition: A | Discharge status: Home / Self Care | Payer: Medicare Other | Source: Ambulatory Visit | Attending: Radiation Oncology | Admitting: Radiation Oncology

## 2012-12-09 ENCOUNTER — Ambulatory Visit
Admission: RE | Admit: 2012-12-09 | Discharge: 2012-12-09 | Disposition: A | Discharge status: Home / Self Care | Payer: Medicare Other | Source: Ambulatory Visit | Attending: Radiation Oncology | Admitting: Radiation Oncology

## 2012-12-12 ENCOUNTER — Ambulatory Visit
Admission: RE | Admit: 2012-12-12 | Discharge: 2012-12-12 | Disposition: A | Discharge status: Home / Self Care | Payer: Medicare Other | Source: Ambulatory Visit | Attending: Radiation Oncology | Admitting: Radiation Oncology

## 2012-12-12 VITALS — BP 111/78 | HR 69 | Temp 97.6°F | Ht 72.0 in | Wt 177.9 lb

## 2012-12-12 DIAGNOSIS — C61 Malignant neoplasm of prostate: Secondary | ICD-10-CM

## 2012-12-12 NOTE — Progress Notes (Signed)
Weekly Management Note:  Site: Prostate  Current Dose: 1170   cGy Projected Dose: 7800  cGy  Narrative: The patient is seen today for routine under treatment assessment. CBCT/MVCT images/port films were reviewed. The chart was reviewed.   Bladder filling satisfactory. No GU or GI difficulties. He does take MiraLax when necessary to avoid constipation.  Physical Examination:  Filed Vitals:   12/12/12 1031  BP: 111/78  Pulse: 69  Temp: 97.6 F (36.4 C)  .  Weight: 177 lb 14.4 oz (80.695 kg). No change.  Impression: Tolerating radiation therapy well.  Plan: Continue radiation therapy as planned.

## 2012-12-12 NOTE — Progress Notes (Signed)
Jonathan Graham here for weekly put visit.  He has had 6/40 fractions to his prostate.  He denies pain and fatigue.  He states his appetite is good.  He denies frequency, urgency, diarrhea and hematuria.  He usually gets up to urinate during the night two to three times.

## 2012-12-13 ENCOUNTER — Ambulatory Visit
Admission: RE | Admit: 2012-12-13 | Discharge: 2012-12-13 | Disposition: A | Discharge status: Home / Self Care | Payer: Medicare Other | Source: Ambulatory Visit | Attending: Radiation Oncology | Admitting: Radiation Oncology

## 2012-12-14 ENCOUNTER — Ambulatory Visit
Admission: RE | Admit: 2012-12-14 | Discharge: 2012-12-14 | Disposition: A | Discharge status: Home / Self Care | Payer: Medicare Other | Source: Ambulatory Visit | Attending: Radiation Oncology | Admitting: Radiation Oncology

## 2012-12-15 ENCOUNTER — Ambulatory Visit
Admission: RE | Admit: 2012-12-15 | Discharge: 2012-12-15 | Disposition: A | Discharge status: Home / Self Care | Payer: Medicare Other | Source: Ambulatory Visit | Attending: Radiation Oncology | Admitting: Radiation Oncology

## 2012-12-16 ENCOUNTER — Ambulatory Visit
Admission: RE | Admit: 2012-12-16 | Discharge: 2012-12-16 | Disposition: A | Discharge status: Home / Self Care | Payer: Medicare Other | Source: Ambulatory Visit | Attending: Radiation Oncology | Admitting: Radiation Oncology

## 2012-12-19 ENCOUNTER — Ambulatory Visit
Admission: RE | Admit: 2012-12-19 | Discharge: 2012-12-19 | Disposition: A | Discharge status: Home / Self Care | Payer: Medicare Other | Source: Ambulatory Visit | Attending: Radiation Oncology | Admitting: Radiation Oncology

## 2012-12-19 VITALS — BP 120/81 | HR 71 | Temp 97.5°F | Ht 72.0 in | Wt 179.2 lb

## 2012-12-19 DIAGNOSIS — C61 Malignant neoplasm of prostate: Secondary | ICD-10-CM

## 2012-12-19 NOTE — Progress Notes (Signed)
Weekly Management Note:   Site: Prostate Current Dose:  2145  cGy Projected Dose: 7800  cGy  Narrative: The patient is seen today for routine under treatment assessment. CBCT/MVCT images/port films were reviewed. The chart was reviewed.   Bladder filling is excellent. He does have perhaps some worsening of his nocturia which is every 2 hours. His preradiation I PSS score was 18. He may be having more frequent bowel movements as well. No constipation.  Physical Examination:  Filed Vitals:   12/19/12 1005  BP: 120/81  Pulse: 71  Temp: 97.5 F (36.4 C)  .  Weight: 179 lb 3.2 oz (81.285 kg). No change .  Impression: Tolerating radiation therapy well. If his obstructive urinary symptoms worsen then he may Flomax to a twice a day schedule.  Plan: Continue radiation therapy as planned.

## 2012-12-19 NOTE — Progress Notes (Signed)
Mr. Colquhoun here for his weekly put visit.  He has had 11/40 fractions to his prostate.  He denies pain and fatigue.  He has had a change in his bowels.  He is going small amouths 4-5 times a day.  He also is getting up every 2 hours during the night to urinate.

## 2012-12-20 ENCOUNTER — Ambulatory Visit
Admission: RE | Admit: 2012-12-20 | Discharge: 2012-12-20 | Disposition: A | Discharge status: Home / Self Care | Payer: Medicare Other | Source: Ambulatory Visit | Attending: Radiation Oncology | Admitting: Radiation Oncology

## 2012-12-21 ENCOUNTER — Ambulatory Visit
Admission: RE | Admit: 2012-12-21 | Discharge: 2012-12-21 | Disposition: A | Discharge status: Home / Self Care | Payer: Medicare Other | Source: Ambulatory Visit | Attending: Radiation Oncology | Admitting: Radiation Oncology

## 2012-12-22 ENCOUNTER — Ambulatory Visit
Admission: RE | Admit: 2012-12-22 | Discharge: 2012-12-22 | Disposition: A | Discharge status: Home / Self Care | Payer: Medicare Other | Source: Ambulatory Visit | Attending: Radiation Oncology | Admitting: Radiation Oncology

## 2012-12-23 ENCOUNTER — Ambulatory Visit
Admission: RE | Admit: 2012-12-23 | Discharge: 2012-12-23 | Disposition: A | Discharge status: Home / Self Care | Payer: Medicare Other | Source: Ambulatory Visit | Attending: Radiation Oncology | Admitting: Radiation Oncology

## 2012-12-26 ENCOUNTER — Ambulatory Visit
Admission: RE | Admit: 2012-12-26 | Discharge: 2012-12-26 | Disposition: A | Discharge status: Home / Self Care | Payer: Medicare Other | Source: Ambulatory Visit | Attending: Radiation Oncology | Admitting: Radiation Oncology

## 2012-12-26 VITALS — BP 110/75 | HR 66 | Temp 97.7°F | Ht 72.0 in | Wt 177.5 lb

## 2012-12-26 DIAGNOSIS — C61 Malignant neoplasm of prostate: Secondary | ICD-10-CM

## 2012-12-26 NOTE — Progress Notes (Signed)
Mr. Ehle here for weekly under treatment visit.  He has had 16/40 fractions to his prostate.  He denies pain and fatigue.  He states that he is having urinary frequency and that he can't tell if the Flomax is working.  He is getting up every 2-3 hours during the night.  He is also starting and stopping frequently while urinating.  He denies urinary urgency, hematuria and diarrhea.  He states that he has noticed that his bowel movements are smaller in amount and have increased in frequency.

## 2012-12-26 NOTE — Progress Notes (Signed)
Weekly Management Note:  Site: Prostate Current Dose:  3120  cGy Projected Dose: 7800  cGy  Narrative: The patient is seen today for routine under treatment assessment. CBCT/MVCT images/port films were reviewed. The chart was reviewed.   Bladder filling is satisfactory. No new GU or GI difficulties except for some increasing urinary frequency and rectal frequency with smaller volume bowel movements. These symptoms are not bothersome. He remains on Flomax once a day but is not sure that the medication is helpful.  Physical Examination:  Filed Vitals:   12/26/12 1115  BP: 110/75  Pulse: 66  Temp: 97.7 F (36.5 C)  .  Weight: 177 lb 8 oz (80.513 kg). No change  Impression: Tolerating radiation therapy well. I will like for him to continue with his Flomax. Plan: Continue radiation therapy as planned.

## 2012-12-27 ENCOUNTER — Ambulatory Visit
Admission: RE | Admit: 2012-12-27 | Discharge: 2012-12-27 | Disposition: A | Discharge status: Home / Self Care | Payer: Medicare Other | Source: Ambulatory Visit | Attending: Radiation Oncology | Admitting: Radiation Oncology

## 2012-12-28 ENCOUNTER — Ambulatory Visit
Admission: RE | Admit: 2012-12-28 | Discharge: 2012-12-28 | Disposition: A | Discharge status: Home / Self Care | Payer: Medicare Other | Source: Ambulatory Visit | Attending: Radiation Oncology | Admitting: Radiation Oncology

## 2012-12-29 ENCOUNTER — Ambulatory Visit
Admission: RE | Admit: 2012-12-29 | Discharge: 2012-12-29 | Disposition: A | Discharge status: Home / Self Care | Payer: Medicare Other | Source: Ambulatory Visit | Attending: Radiation Oncology | Admitting: Radiation Oncology

## 2012-12-30 ENCOUNTER — Ambulatory Visit
Admission: RE | Admit: 2012-12-30 | Discharge: 2012-12-30 | Disposition: A | Discharge status: Home / Self Care | Payer: Medicare Other | Source: Ambulatory Visit | Attending: Radiation Oncology | Admitting: Radiation Oncology

## 2013-01-02 ENCOUNTER — Ambulatory Visit
Admission: RE | Admit: 2013-01-02 | Discharge: 2013-01-02 | Disposition: A | Discharge status: Home / Self Care | Payer: Medicare Other | Source: Ambulatory Visit | Attending: Radiation Oncology | Admitting: Radiation Oncology

## 2013-01-02 VITALS — BP 113/60 | HR 73 | Temp 97.6°F | Wt 177.7 lb

## 2013-01-02 DIAGNOSIS — C61 Malignant neoplasm of prostate: Secondary | ICD-10-CM

## 2013-01-02 NOTE — Progress Notes (Signed)
Patient here for routine weekly assessment of prostate radiation.Completed 21 of 40 treatments.Has some occasional urgency of urination.Difficulty with start of urine flow.No burning.Nocturia every 2 hours.Bowels more frequent and soft.

## 2013-01-02 NOTE — Progress Notes (Signed)
Weekly Management Note:  Site: Prostate Current Dose:  4095  cGy Projected Dose: 7800  cGy  Narrative: The patient is seen today for routine under treatment assessment. CBCT/MVCT images/port films were reviewed. The chart was reviewed.  Bladder filling is excellent today. He does report some increasing urinary urgency and some hesitancy. No other significant GU or GI difficulties.  Physical Examination:  Filed Vitals:   01/02/13 1008  BP: 113/60  Pulse: 73  Temp: 97.6 F (36.4 C)  .  Weight: 177 lb 11.2 oz (80.604 kg). No change.  Impression: Tolerating radiation therapy well.  Plan: Continue radiation therapy as planned.

## 2013-01-03 ENCOUNTER — Ambulatory Visit
Admission: RE | Admit: 2013-01-03 | Discharge: 2013-01-03 | Disposition: A | Discharge status: Home / Self Care | Payer: Medicare Other | Source: Ambulatory Visit | Attending: Radiation Oncology | Admitting: Radiation Oncology

## 2013-01-04 ENCOUNTER — Ambulatory Visit
Admission: RE | Admit: 2013-01-04 | Discharge: 2013-01-04 | Disposition: A | Discharge status: Home / Self Care | Payer: Medicare Other | Source: Ambulatory Visit | Attending: Radiation Oncology | Admitting: Radiation Oncology

## 2013-01-05 ENCOUNTER — Ambulatory Visit
Admission: RE | Admit: 2013-01-05 | Discharge: 2013-01-05 | Disposition: A | Discharge status: Home / Self Care | Payer: Medicare Other | Source: Ambulatory Visit | Attending: Radiation Oncology | Admitting: Radiation Oncology

## 2013-01-06 ENCOUNTER — Ambulatory Visit
Admission: RE | Admit: 2013-01-06 | Discharge: 2013-01-06 | Disposition: A | Discharge status: Home / Self Care | Payer: Medicare Other | Source: Ambulatory Visit | Attending: Radiation Oncology | Admitting: Radiation Oncology

## 2013-01-07 ENCOUNTER — Ambulatory Visit
Admission: RE | Admit: 2013-01-07 | Discharge: 2013-01-07 | Disposition: A | Discharge status: Home / Self Care | Payer: Medicare Other | Source: Ambulatory Visit | Attending: Radiation Oncology | Admitting: Radiation Oncology

## 2013-01-09 ENCOUNTER — Encounter: Payer: Self-pay | Admitting: Radiation Oncology

## 2013-01-09 ENCOUNTER — Ambulatory Visit
Admission: RE | Admit: 2013-01-09 | Discharge: 2013-01-09 | Disposition: A | Discharge status: Home / Self Care | Payer: Medicare Other | Source: Ambulatory Visit | Attending: Radiation Oncology | Admitting: Radiation Oncology

## 2013-01-09 VITALS — BP 106/74 | HR 70 | Temp 97.4°F | Ht 73.0 in | Wt 178.1 lb

## 2013-01-09 DIAGNOSIS — C61 Malignant neoplasm of prostate: Secondary | ICD-10-CM

## 2013-01-09 NOTE — Progress Notes (Signed)
   Weekly Management Note:  outpatient Current Dose:  50.7 Gy  Projected Dose: 78 Gy   Narrative:  The patient presents for routine under treatment assessment.  CBCT/MVCT images/Port film x-rays were reviewed.  The chart was checked. He reports his symptoms are stable from last week. No new complaints  Physical Findings:  height is 6\' 1"  (1.854 m) and weight is 178 lb 1.6 oz (80.786 kg). His temperature is 97.4 F (36.3 C). His blood pressure is 106/74 and his pulse is 70.   NAD  Impression:  The patient is tolerating radiotherapy.  Plan:  Continue radiotherapy as planned.  ________________________________   Lonie Peak, M.D.

## 2013-01-09 NOTE — Progress Notes (Signed)
Jonathan Graham  Has received  26 fractions to his prostate.  He C/O dysuria 5-6  On a scale of 0-10, more during the night, and C/O incomplete emptying.  His stream stops and starts and he voids at least Q 2 hours during the night.  He states he has small frequent stool, no diarrhea,but denies any rectal pain nor soreness.  He spouse notes decrease on Friday and Saturdays.

## 2013-01-10 ENCOUNTER — Ambulatory Visit
Admission: RE | Admit: 2013-01-10 | Discharge: 2013-01-10 | Disposition: A | Discharge status: Home / Self Care | Payer: Medicare Other | Source: Ambulatory Visit | Attending: Radiation Oncology | Admitting: Radiation Oncology

## 2013-01-10 ENCOUNTER — Ambulatory Visit: Payer: Medicare Other

## 2013-01-11 ENCOUNTER — Ambulatory Visit: Payer: Medicare Other

## 2013-01-11 ENCOUNTER — Ambulatory Visit
Admission: RE | Admit: 2013-01-11 | Discharge: 2013-01-11 | Disposition: A | Discharge status: Home / Self Care | Payer: Medicare Other | Source: Ambulatory Visit | Attending: Radiation Oncology | Admitting: Radiation Oncology

## 2013-01-12 ENCOUNTER — Ambulatory Visit
Admission: RE | Admit: 2013-01-12 | Discharge: 2013-01-12 | Disposition: A | Discharge status: Home / Self Care | Payer: Medicare Other | Source: Ambulatory Visit | Attending: Radiation Oncology | Admitting: Radiation Oncology

## 2013-01-13 ENCOUNTER — Ambulatory Visit
Admission: RE | Admit: 2013-01-13 | Discharge: 2013-01-13 | Disposition: A | Discharge status: Home / Self Care | Payer: Medicare Other | Source: Ambulatory Visit | Attending: Radiation Oncology | Admitting: Radiation Oncology

## 2013-01-16 ENCOUNTER — Ambulatory Visit
Admission: RE | Admit: 2013-01-16 | Discharge: 2013-01-16 | Disposition: A | Discharge status: Home / Self Care | Payer: Medicare Other | Source: Ambulatory Visit | Attending: Radiation Oncology | Admitting: Radiation Oncology

## 2013-01-16 VITALS — BP 118/67 | HR 68 | Temp 97.6°F | Wt 177.7 lb

## 2013-01-16 DIAGNOSIS — C61 Malignant neoplasm of prostate: Secondary | ICD-10-CM

## 2013-01-16 NOTE — Progress Notes (Signed)
Patient for weekly assessment of radiation to prostate.Completed 31 of 40 treatments.Denies pain or urinary/bladder problems.Urine stream slow and weak.Increased fatigue, taking longer naps.No medication changes.

## 2013-01-16 NOTE — Progress Notes (Signed)
Weekly Management Note:  Site: Prostate Current Dose:  6045  cGy Projected Dose: 7800  cGy  Narrative: The patient is seen today for routine under treatment assessment. CBCT/MVCT images/port films were reviewed. The chart was reviewed.   Bladder filling is excellent. He continues to have slowing of his urinary stream for which he takes Flomax each day. He is also having more frequent bowel movements but no diarrhea.  Physical Examination:  Filed Vitals:   01/16/13 1000  BP: 118/67  Pulse: 68  Temp: 97.6 F (36.4 C)  .  Weight: 177 lb 11.2 oz (80.604 kg). No change.  Impression: Tolerating radiation therapy well.  Plan: Continue radiation therapy as planned.

## 2013-01-17 ENCOUNTER — Ambulatory Visit
Admission: RE | Admit: 2013-01-17 | Discharge: 2013-01-17 | Disposition: A | Discharge status: Home / Self Care | Payer: Medicare Other | Source: Ambulatory Visit | Attending: Radiation Oncology | Admitting: Radiation Oncology

## 2013-01-18 ENCOUNTER — Telehealth: Payer: Self-pay | Admitting: Family Medicine

## 2013-01-18 ENCOUNTER — Encounter: Payer: Self-pay | Admitting: Family Medicine

## 2013-01-18 ENCOUNTER — Ambulatory Visit (INDEPENDENT_AMBULATORY_CARE_PROVIDER_SITE_OTHER): Payer: Medicare Other | Admitting: Family Medicine

## 2013-01-18 ENCOUNTER — Ambulatory Visit
Admission: RE | Admit: 2013-01-18 | Discharge: 2013-01-18 | Disposition: A | Discharge status: Home / Self Care | Payer: Medicare Other | Source: Ambulatory Visit | Attending: Radiation Oncology | Admitting: Radiation Oncology

## 2013-01-18 VITALS — BP 84/58 | Temp 99.0°F | Wt 177.0 lb

## 2013-01-18 DIAGNOSIS — R509 Fever, unspecified: Secondary | ICD-10-CM

## 2013-01-18 LAB — POCT URINALYSIS DIPSTICK
Blood, UA: NEGATIVE
Protein, UA: NEGATIVE
Spec Grav, UA: >=1.03
Urobilinogen, UA: 0.2

## 2013-01-18 LAB — POCT INFLUENZA A/B: Influenza B, POC: NEGATIVE

## 2013-01-18 MED ORDER — ONDANSETRON 8 MG PO TBDP: 8.0000 mg | ORAL_TABLET | Freq: Three times a day (TID) | ORAL | Status: DC | PRN

## 2013-01-18 NOTE — Telephone Encounter (Signed)
Spoke with emergency contact & let her know that we did not have anything earlier. She was ok with that. Will bring him to the 4:30 appt. Encounter closed.

## 2013-01-18 NOTE — Telephone Encounter (Signed)
noted 

## 2013-01-18 NOTE — Progress Notes (Signed)
Subjective:    Patient ID: Jonathan Graham, male    DOB: Dec 10, 1927, 79 y.o.   MRN: [id number]  HPI Acute visit. Patient has past medical history significant for prostate cancer, osteoarthritis, hyperlipidemia. Earlier today received radiation for prostate cancer. He returned home at around 10 AM noticed sudden onset of shaking chill and fever 102 This was accompanied by some nausea but no vomiting. He took some aspirin and fever promptly improved. He has not had any upper respiratory symptoms such as nasal congestion, sore throat, or cough. He has some chronic slow urinary stream but no burning with urination or any new symptoms Denies any headache, stiff neck, skin rash, abdominal pain, increased arthralgias. No recent tick bites.  Actually feels much better at this time than he did earlier today No sick exposures  Past Medical History  Diagnosis Date  . Hypercholesterolemia   . Atypical chest pain     history of   . Left bundle branch block   . History of mononucleosis   . Weakness     history of   . Cardiovascular stress test abnormal 04/25/2004    Abnormal adenosine Cardiolite study / evidence of inferoseptal ischemia &  an EF of 48% ""recommended that he undergo cardiac catheterization""  . Shortness of breath   . Fatigue     history of   . Cervical spine disease   . Spondylosis, cervical     at C3-C4  . Pleurisy 07/2005  . Dizziness     occasionally apon bending over  . Varicose veins   . Back pain   . Arthritis    Past Surgical History  Procedure Laterality Date  . Cardiac catheterization  2005    EF estimated at 50% /  PROCEDURE:  Left heart catheterization, coronary and left ventricular angiography /  RAO view demonstrates mild LV enlargement  / mild hypokinesia anteroseptal with  overall low normal LV systolic function / Normal coronary anatomy  . Back surgery  1995     2 ruptured discs in lower back / by Dr. Otelia Sergeant  . Tonsillectomy and adenoidectomy    .  Hemorrhoid surgery    . Prostate biopsy  08/25/2012    Gleason 3+3=6 PSA 7.80    reports that he quit smoking about 61 years ago. His smoking use included Cigarettes. He has a 7 pack-year smoking history. He does not have any smokeless tobacco history on file. He reports that he does not drink alcohol or use illicit drugs. family history includes Cancer in his father; Heart disease in his mother; Heart failure in his mother; Lung cancer in his brother; Multiple sclerosis in his sister; and Stroke in his father. Allergies  Allergen Reactions  . Lipitor (Atorvastatin Calcium)     cramps      Review of Systems  Constitutional: Positive for fever, chills and fatigue. Negative for appetite change.  HENT: Negative for ear pain, congestion, sore throat and neck stiffness.   Respiratory: Negative for cough and shortness of breath.   Cardiovascular: Negative for chest pain and leg swelling.  Gastrointestinal: Positive for nausea. Negative for vomiting and abdominal pain.  Genitourinary: Negative for flank pain.  Skin: Negative for rash.  Neurological: Negative for headaches.  Hematological: Negative for adenopathy.  Psychiatric/Behavioral: Negative for confusion.       Objective:   Physical Exam  Constitutional:  Patient is alert and nontoxic in appearance  HENT:  Right Ear: External ear normal.  Left Ear: External ear normal.  Mouth/Throat: Oropharynx is clear and moist.  Neck: Neck supple.  Cardiovascular: Normal rate and regular rhythm.   Pulmonary/Chest: Effort normal and breath sounds normal. No respiratory distress. He has no wheezes. He has no rales.  Abdominal: Soft. There is no tenderness. There is no rebound and no guarding.  Musculoskeletal: He exhibits no edema.  Lymphadenopathy:    He has no cervical adenopathy.  Neurological: He is alert.  Skin: No rash noted.          Assessment & Plan:  Febrile illness. Patient does not have typical upper respiratory  symptoms. Doubt influenza. He does not have any cough or other findings to suggest pneumonia. Urine dipstick is normal.  Nonfocal exam. Nausea is improved at this time. Check influenza screen and CBC. Urinalysis as above. Zofran 8 mg every 8 hours as needed for nausea. Question viral. We did not start antibiotic at this time since we did not see any clear indication of bacterial process at this point

## 2013-01-18 NOTE — Telephone Encounter (Signed)
°  Patient Information:  Caller Name: Jonathan Graham  Phone: [phone]  Patient: Jonathan Graham, Jonathan Graham  Gender: Male  DOB: 1928/08/05  Age: 79 Years  PCP: Jonathan Graham [facility])  Office Follow Up:  Does the office need to follow up with this patient?: yes  Instructions For The Office:see below  RN Note:  appt made for 1630. PT feeling very ill. Please call back if pt can be worked in sooner than that.  Symptoms  Reason For Call & Symptoms: Wife, Jonathan Graham, calling regaring pt's fever of 103.1-now Took 2 Aspirin 325 mg at 1200. Called earlier and was advise to call oncologist since pt had radiation trx for prostate CA this am. Wife states Dr. Dayton Scrape, Oncologist advised them to call PCP and be seen today. ASAP. Pt now vomiting. Pt is having chills "badly".  Reviewed Health History In EMR: Yes  Reviewed Medications In EMR: Yes  Reviewed Allergies In EMR: Yes  Reviewed Surgeries / Procedures: Yes  Date of Onset of Symptoms: 01/18/2013  Treatments Tried: 2, 325 mg ASA at 1200,  Treatments Tried Worked: No  Any Fever: Yes  Fever Taken: Oral  Fever Time Of Reading: 14:40:13  Fever Last Reading: 103.1  Guideline(s) Used:  Fever  Disposition Per Guideline:   Go to ED Now (or to Office with PCP Approval)  Reason For Disposition Reached:   Fever > 103 F (39.4 C)  Advice Given:  For All Fevers:  Drink cold fluids orally to prevent dehydration (Reason: good hydration replaces sweat and improves heat loss via skin). Adults should drink 6-8 glasses of water daily.  Call Back If:  You become worse  Patient Will Follow Care Advice:  YES  Appointment Scheduled:  01/18/2013 16:30:00 Appointment Scheduled Provider:  Evelena Graham (Family Practice)

## 2013-01-18 NOTE — Telephone Encounter (Signed)
Caller: Billy/Patient; Phone: [phone]; Reason for Call: Had radiation treatment this morning -01/18/13 and this afternoon started with fever= 102.  0 and is having chills.  Sees Dr. Minerva [facility] @ [facility].  Advised that she call Oncologist's Office to let them know and f/u with [place] if needed for any reason.

## 2013-01-19 ENCOUNTER — Ambulatory Visit
Admission: RE | Admit: 2013-01-19 | Discharge: 2013-01-19 | Disposition: A | Discharge status: Home / Self Care | Payer: Medicare Other | Source: Ambulatory Visit | Attending: Radiation Oncology | Admitting: Radiation Oncology

## 2013-01-19 LAB — CBC WITH DIFFERENTIAL/PLATELET
Basophils Relative: 0 % (ref 0.0–3.0)
Eosinophils Absolute: 0 10*3/uL (ref 0.0–0.7)
MCHC: 34.2 g/dL (ref 30.0–36.0)
MCV: 86.7 fl (ref 78.0–100.0)
Monocytes Absolute: 0.9 10*3/uL (ref 0.1–1.0)
Neutrophils Relative %: 88.9 % — ABNORMAL HIGH (ref 43.0–77.0)
Platelets: 201 10*3/uL (ref 150.0–400.0)
RBC: 4.31 Mil/uL (ref 4.22–5.81)

## 2013-01-20 ENCOUNTER — Ambulatory Visit
Admission: RE | Admit: 2013-01-20 | Discharge: 2013-01-20 | Disposition: A | Discharge status: Home / Self Care | Payer: Medicare Other | Source: Ambulatory Visit | Attending: Radiation Oncology | Admitting: Radiation Oncology

## 2013-01-24 ENCOUNTER — Ambulatory Visit
Admission: RE | Admit: 2013-01-24 | Discharge: 2013-01-24 | Disposition: A | Discharge status: Home / Self Care | Payer: Medicare Other | Source: Ambulatory Visit | Attending: Radiation Oncology | Admitting: Radiation Oncology

## 2013-01-24 ENCOUNTER — Other Ambulatory Visit (INDEPENDENT_AMBULATORY_CARE_PROVIDER_SITE_OTHER): Payer: Medicare Other

## 2013-01-24 VITALS — BP 106/68 | HR 77 | Temp 97.6°F | Wt 174.7 lb

## 2013-01-24 DIAGNOSIS — C61 Malignant neoplasm of prostate: Secondary | ICD-10-CM

## 2013-01-24 DIAGNOSIS — E7800 Pure hypercholesterolemia, unspecified: Secondary | ICD-10-CM

## 2013-01-24 LAB — HEPATIC FUNCTION PANEL
ALT: 12 U/L (ref 0–53)
Bilirubin, Direct: 0.1 mg/dL (ref 0.0–0.3)
Total Bilirubin: 0.8 mg/dL (ref 0.3–1.2)

## 2013-01-24 LAB — BASIC METABOLIC PANEL
GFR: 70.08 mL/min (ref >60.00)
Potassium: 4 mEq/L (ref 3.5–5.1)
Sodium: 138 mEq/L (ref 135–145)

## 2013-01-24 LAB — LIPID PANEL: VLDL: 18.6 mg/dL (ref 0.0–40.0)

## 2013-01-24 NOTE — Progress Notes (Signed)
Quick Note:  I spoke with pt wife, and his fever is staying down, other than fatigue, he seems to be doing better. FYI ______

## 2013-01-24 NOTE — Progress Notes (Signed)
Patient here for routine weekly assessment of radiation to pelvis for prostate cancer.Completed 36 of 40 treatments.Doing well from radiation.Has occasional frequency with slow start and stop.Had episode last Wednesday of fever of 102, seen by Dr.Burchette, lab work performed but all came back negative.Patient's symptoms resolved have resolved.Has generalized fatigue from radiation.

## 2013-01-24 NOTE — Progress Notes (Signed)
Quick Note:  Please make copy of labs for patient visit. ______ 

## 2013-01-24 NOTE — Progress Notes (Signed)
Weekly Management Note:  Site: Prostate Current Dose:  7020  cGy Projected Dose: 7800  cGy  Narrative: The patient is seen today for routine under treatment assessment. CBCT/MVCT images/port films were reviewed. The chart was reviewed.   Excellent bladder filling. No new GU or GI difficulty. He had a fever last week and he saw his primary care physician. His symptoms resolved without treatment.  Physical Examination:  Filed Vitals:   01/24/13 0955  BP: 106/68  Pulse: 77  Temp: 97.6 F (36.4 C)  .  Weight: 174 lb 11.2 oz (79.243 kg). No change.  Impression: Tolerating radiation therapy well. He'll finish his treatment next Monday.  Plan: Continue radiation therapy as planned.

## 2013-01-25 ENCOUNTER — Ambulatory Visit
Admission: RE | Admit: 2013-01-25 | Discharge: 2013-01-25 | Disposition: A | Discharge status: Home / Self Care | Payer: Medicare Other | Source: Ambulatory Visit | Attending: Radiation Oncology | Admitting: Radiation Oncology

## 2013-01-26 ENCOUNTER — Ambulatory Visit
Admission: RE | Admit: 2013-01-26 | Discharge: 2013-01-26 | Disposition: A | Discharge status: Home / Self Care | Payer: Medicare Other | Source: Ambulatory Visit | Attending: Radiation Oncology | Admitting: Radiation Oncology

## 2013-01-26 ENCOUNTER — Encounter: Payer: Self-pay | Admitting: Cardiology

## 2013-01-26 ENCOUNTER — Ambulatory Visit (INDEPENDENT_AMBULATORY_CARE_PROVIDER_SITE_OTHER): Payer: Medicare Other | Admitting: Cardiology

## 2013-01-26 VITALS — BP 118/68 | HR 64 | Ht 72.0 in | Wt 174.8 lb

## 2013-01-26 DIAGNOSIS — I359 Nonrheumatic aortic valve disorder, unspecified: Secondary | ICD-10-CM

## 2013-01-26 DIAGNOSIS — E7800 Pure hypercholesterolemia, unspecified: Secondary | ICD-10-CM

## 2013-01-26 DIAGNOSIS — I358 Other nonrheumatic aortic valve disorders: Secondary | ICD-10-CM

## 2013-01-26 DIAGNOSIS — I447 Left bundle-branch block, unspecified: Secondary | ICD-10-CM

## 2013-01-26 DIAGNOSIS — C61 Malignant neoplasm of prostate: Secondary | ICD-10-CM

## 2013-01-26 NOTE — Progress Notes (Signed)
Jonathan Graham Date of Birth:  Jan 15, 1928 [facility] [address] [place], [place]  [address] [phone]         Fax   [phone]  History of Present Illness:  This pleasant 79 year old gentleman is seen for a six-month followup office visit. He has not been experiencing any new cardiac symptoms. He does have a history of known left bundle branch block and a history of hypercholesterolemia. He has a past history of syncope but has had no recent syncopal episodes.  Last year he was noted to have a rapidly rising PSA and was diagnosed with prostate cancer.  He elected to have radiation therapy and Dr. Dayton Scrape is his radiation oncologist.  The patient has had a total of 38/40 sessions of radiotherapy and is tolerating it well.  He will have his last 2 sessions next week and then they will head to the beach.  The patient has a history of mild aortic stenosis.  He had an echocardiogram in 07/26/13 showing mild aortic stenosis and an ejection fraction of 45%.  He has known left bundle branch block.  Current Outpatient Prescriptions  Medication Sig Dispense Refill  . aspirin 325 MG tablet Take 325 mg by mouth 3 (three) times a week.        . ondansetron (ZOFRAN ODT) 8 MG disintegrating tablet Take 1 tablet (8 mg total) by mouth every 8 (eight) hours as needed for nausea.  20 tablet  0  . simvastatin (ZOCOR) 10 MG tablet Take 0.5 tablets (5 mg total) by mouth at bedtime.  15 tablet  6  . tamsulosin (FLOMAX) 0.4 MG CAPS Take 1 capsule (0.4 mg total) by mouth daily.  30 capsule  6   No current facility-administered medications for this visit.    Allergies  Allergen Reactions  . Lipitor (Atorvastatin Calcium)     cramps    Patient Active Problem List   Diagnosis Date Noted  . Left bundle branch block     Priority: Medium  . Prostate cancer 10/19/2012  . Heart murmur, aortic 07/21/2012  . Echocardiogram shows left ventricular diastolic dysfunction 06/30/2011  .  Osteoarthritis of right knee 01/23/2011  . Varicose vein of leg 01/23/2011  . Hypercholesterolemia   . Dizziness     History  Smoking status  . Former Smoker -- 1.00 packs/day for 7 years  . Types: Cigarettes  . Quit date: 09/01/1951  Smokeless tobacco  . Not on file    History  Alcohol Use No    Family History  Problem Relation Age of Onset  . Stroke Father   . Cancer Father   . Heart failure Mother   . Heart disease Mother   . Multiple sclerosis Sister   . Lung cancer Brother     Review of Systems: Constitutional: no fever chills diaphoresis or fatigue or change in weight.  Head and neck: no hearing loss, no epistaxis, no photophobia or visual disturbance. Respiratory: No cough, shortness of breath or wheezing. Cardiovascular: No chest pain peripheral edema, palpitations. Gastrointestinal: No abdominal distention, no abdominal pain, no change in bowel habits hematochezia or melena. Genitourinary: No dysuria, no frequency, no urgency, no nocturia. Musculoskeletal:No arthralgias, no back pain, no gait disturbance or myalgias. Neurological: No dizziness, no headaches, no numbness, no seizures, no syncope, no weakness, no tremors. Hematologic: No lymphadenopathy, no easy bruising. Psychiatric: No confusion, no hallucinations, no sleep disturbance.    Physical Exam: Filed Vitals:   01/26/13 1526  BP: 118/68  Pulse: 64   the general appearance reveals a well-developed well-nourished gentleman in no distress.The head and neck exam reveals pupils equal and reactive.  Extraocular movements are full.  There is no scleral icterus.  The mouth and pharynx are normal.  The neck is supple.  The carotids reveal no bruits.  The jugular venous pressure is normal.  The  thyroid is not enlarged.  There is no lymphadenopathy.  The chest is clear to percussion and auscultation.  There are no rales or rhonchi.  Expansion of the chest is symmetrical.  The precordium is quiet.  The first  heart sound is normal.  The second heart sound is physiologically split.  There is no  gallop rub or click.  There is a grade 2/6 systolic ejection murmur at the base. There is no abnormal lift or heave.  The abdomen is soft and nontender.  The bowel sounds are normal.  The liver and spleen are not enlarged.  There are no abdominal masses.  There are no abdominal bruits.  Extremities reveal good pedal pulses.  There is no phlebitis or edema.  There is no cyanosis or clubbing.  Strength is normal and symmetrical in all extremities.  There is no lateralizing weakness.  There are no sensory deficits.  The skin is warm and dry.  There is no rash.     Assessment / Plan: Continue same medication.  Recheck in 6 months for followup office visit EKG lipid panel hepatic function panel and basal metabolic panel

## 2013-01-26 NOTE — Assessment & Plan Note (Signed)
No chest pain.  No symptoms of CHF.  No dizziness or syncope.

## 2013-01-26 NOTE — Assessment & Plan Note (Signed)
No dizziness or syncope related to his left bundle branch block.

## 2013-01-26 NOTE — Assessment & Plan Note (Signed)
Lipids are remaining stable on current therapy

## 2013-01-26 NOTE — Patient Instructions (Addendum)
Your physician recommends that you continue on your current medications as directed. Please refer to the Current Medication list given to you today.  Your physician wants you to follow-up in: 6 months with fasting labs (lp/bmet/hfp)  You will receive a reminder letter in the mail two months in advance. If you don't receive a letter, please call our office to schedule the follow-up appointment.  

## 2013-01-26 NOTE — Assessment & Plan Note (Signed)
He has urinary frequency related to the prostate cancer and radiation therapy.

## 2013-01-27 ENCOUNTER — Ambulatory Visit
Admission: RE | Admit: 2013-01-27 | Discharge: 2013-01-27 | Disposition: A | Discharge status: Home / Self Care | Payer: Medicare Other | Source: Ambulatory Visit | Attending: Radiation Oncology | Admitting: Radiation Oncology

## 2013-01-30 ENCOUNTER — Encounter: Payer: Self-pay | Admitting: Radiation Oncology

## 2013-01-30 ENCOUNTER — Ambulatory Visit
Admission: RE | Admit: 2013-01-30 | Discharge: 2013-01-30 | Disposition: A | Discharge status: Home / Self Care | Payer: Medicare Other | Source: Ambulatory Visit | Attending: Radiation Oncology | Admitting: Radiation Oncology

## 2013-01-30 VITALS — BP 110/74 | HR 73 | Temp 97.8°F | Resp 20 | Wt 175.0 lb

## 2013-01-30 DIAGNOSIS — C61 Malignant neoplasm of prostate: Secondary | ICD-10-CM

## 2013-01-30 NOTE — Progress Notes (Signed)
Chart note: Jonathan Graham began his IMRT on 12/05/2012. He was treated with 2 volume modulated arcs with 2 sets of dynamic MLCs corresponding to one set of IMRT treatment devices [phone])

## 2013-01-30 NOTE — Progress Notes (Signed)
Weekly Management Note:  Site: Prostate Current Dose:  7800  cGy Projected Dose: 7800  cGy  Narrative: The patient is seen today for routine under treatment assessment. CBCT/MVCT images/port films were reviewed. The chart was reviewed.   Bladder filling is excellent. No new GU or GI difficulties. He still has a slow stream along with mild dysuria. He is on Flomax. Slight increase in bowel frequency.  Physical Examination:  Filed Vitals:   01/30/13 1039  BP: 110/74  Pulse: 73  Temp: 97.8 F (36.6 C)  Resp: 20  .  Weight: 175 lb (79.379 kg). No change.  Impression: Radiation therapy completed today.  Plan: One-month followup.

## 2013-01-30 NOTE — Progress Notes (Signed)
[facility] Radiation Oncology End of Treatment Note  Name:Jonathan Graham  Date: 01/30/2013 ZOX:[id number] DOB:1928-05-17   Status:outpatient    CC: Kristian Covey, MD  Dr. Barron Alvine  REFERRING PHYSICIAN:   Dr. Barron Alvine   DIAGNOSIS:  Stage TI C. intermediate risk adenocarcinoma prostate  INDICATION FOR TREATMENT: Curative   TREATMENT DATES: 12/05/2012 through 01/30/2013                           SITE/DOSE:    Prostate 7800 cGy 40 sessions, seminal vesicles 5600 cGy 40 sessions                        BEAMS/ENERGY: 6 MV photons, VMAT IMRT                NARRATIVE:   Mr. Bushey tolerated treatment well although he did have slowing of his urinary stream during his course of therapy for which he took Flomax. He also has slight increase in rectal frequency towards in of his treatment.                         PLAN: Routine followup in one month. Patient instructed to call if questions or worsening complaints in interim.

## 2013-01-30 NOTE — Progress Notes (Signed)
Weekly rad tx prostate, 40/40, aleert,oriented x3, has dysuria, slow stream starting stated, nocturia  Up every 2 hours at night, bowels soft , small amts daily, takes flomax daily, gets tired easily, still works on his pond, spitting wood, and mowing yard 10:43 AM

## 2013-03-06 ENCOUNTER — Encounter: Payer: Self-pay | Admitting: Radiation Oncology

## 2013-03-07 ENCOUNTER — Other Ambulatory Visit: Payer: Self-pay | Admitting: Cardiology

## 2013-03-07 ENCOUNTER — Ambulatory Visit
Admission: RE | Admit: 2013-03-07 | Discharge: 2013-03-07 | Disposition: A | Discharge status: Home / Self Care | Payer: Medicare Other | Source: Ambulatory Visit | Attending: Radiation Oncology | Admitting: Radiation Oncology

## 2013-03-07 VITALS — BP 128/69 | HR 63 | Temp 97.9°F | Ht 72.0 in | Wt 176.8 lb

## 2013-03-07 DIAGNOSIS — C61 Malignant neoplasm of prostate: Secondary | ICD-10-CM

## 2013-03-07 HISTORY — DX: Personal history of irradiation: Z92.3

## 2013-03-07 NOTE — Progress Notes (Signed)
CC: Dr. Barron Alvine  Followup note:  Mr. Granquist returns today just over 1 month following completion of external beam/IMRT in the management of his stage TI C. intermediate risk adenocarcinoma prostate. He is doing well from a GU and GI standpoint. He believes that he is back to his baseline urinary habits. He'll see Dr. Isabel Caprice for a followup visit on July 28.  Physical Examination: Alert and oriented. Filed Vitals:   03/07/13 1110  BP: 128/69  Pulse: 63  Temp: 97.9 F (36.6 C)   Rectal examination not performed today.  Impression: Satisfactory progress.  Plan: The patient see Dr. Isabel Caprice for a followup visit on July 28. He may have a PSA determination at that time. I've not scheduled the patient for a formal followup visit here, and I ask that Dr. Isabel Caprice keep me posted on his progress.

## 2013-03-07 NOTE — Progress Notes (Signed)
Verlee Rossetti here with his wife for follow up after treatment to his prostate.  He denies pain.  He says that his urinary frequency has improved.  He usally gets up 2 times per night to urinate.  He denies hematuria, dysuria, fatigue and diarrhea.

## 2013-04-05 ENCOUNTER — Other Ambulatory Visit: Payer: Self-pay

## 2013-05-26 ENCOUNTER — Encounter: Payer: Self-pay | Admitting: Cardiology

## 2013-05-29 ENCOUNTER — Encounter: Payer: Self-pay | Admitting: Cardiology

## 2013-06-02 ENCOUNTER — Other Ambulatory Visit (INDEPENDENT_AMBULATORY_CARE_PROVIDER_SITE_OTHER): Payer: Medicare Other

## 2013-06-02 DIAGNOSIS — Z0000 Encounter for general adult medical examination without abnormal findings: Secondary | ICD-10-CM

## 2013-06-02 DIAGNOSIS — E7800 Pure hypercholesterolemia, unspecified: Secondary | ICD-10-CM

## 2013-06-02 DIAGNOSIS — C61 Malignant neoplasm of prostate: Secondary | ICD-10-CM

## 2013-06-02 LAB — HEPATIC FUNCTION PANEL
AST: 23 U/L (ref 0–37)
Albumin: 4 g/dL (ref 3.5–5.2)
Alkaline Phosphatase: 66 U/L (ref 39–117)
Total Protein: 6.5 g/dL (ref 6.0–8.3)

## 2013-06-02 LAB — BASIC METABOLIC PANEL
CO2: 26 mEq/L (ref 19–32)
Calcium: 8.8 mg/dL (ref 8.4–10.5)
Chloride: 106 mEq/L (ref 96–112)
Potassium: 4.2 mEq/L (ref 3.5–5.1)
Sodium: 138 mEq/L (ref 135–145)

## 2013-06-02 LAB — CBC WITH DIFFERENTIAL/PLATELET
Basophils Relative: 1.5 % (ref 0.0–3.0)
Eosinophils Absolute: 0.4 10*3/uL (ref 0.0–0.7)
HCT: 35.9 % — ABNORMAL LOW (ref 39.0–52.0)
Hemoglobin: 12 g/dL — ABNORMAL LOW (ref 13.0–17.0)
Lymphocytes Relative: 19.2 % (ref 12.0–46.0)
MCHC: 33.5 g/dL (ref 30.0–36.0)
Neutro Abs: 2.6 10*3/uL (ref 1.4–7.7)
RBC: 4.02 Mil/uL — ABNORMAL LOW (ref 4.22–5.81)

## 2013-06-02 LAB — POCT URINALYSIS DIPSTICK
Leukocytes, UA: NEGATIVE
Nitrite, UA: NEGATIVE
Protein, UA: NEGATIVE
Urobilinogen, UA: 0.2
pH, UA: 5.5

## 2013-06-02 LAB — TSH: TSH: 1.52 u[IU]/mL (ref 0.35–5.50)

## 2013-06-02 LAB — LIPID PANEL
Cholesterol: 164 mg/dL (ref 0–200)
HDL: 45.8 mg/dL (ref >39.00)
LDL Cholesterol: 107 mg/dL — ABNORMAL HIGH (ref 0–99)
Total CHOL/HDL Ratio: 4

## 2013-06-02 LAB — PSA: PSA: 5.23 ng/mL — ABNORMAL HIGH (ref 0.10–4.00)

## 2013-06-07 ENCOUNTER — Encounter: Payer: Self-pay | Admitting: *Deleted

## 2013-06-14 ENCOUNTER — Encounter: Payer: Self-pay | Admitting: Cardiology

## 2013-06-16 ENCOUNTER — Encounter: Payer: Self-pay | Admitting: Family Medicine

## 2013-06-16 ENCOUNTER — Ambulatory Visit (INDEPENDENT_AMBULATORY_CARE_PROVIDER_SITE_OTHER): Payer: Medicare Other | Admitting: Family Medicine

## 2013-06-16 VITALS — BP 128/80 | HR 60 | Temp 97.4°F | Wt 180.0 lb

## 2013-06-16 DIAGNOSIS — D649 Anemia, unspecified: Secondary | ICD-10-CM

## 2013-06-16 DIAGNOSIS — Z0000 Encounter for general adult medical examination without abnormal findings: Secondary | ICD-10-CM

## 2013-06-16 DIAGNOSIS — Z23 Encounter for immunization: Secondary | ICD-10-CM

## 2013-06-16 LAB — VITAMIN B12: Vitamin B-12: 378 pg/mL (ref 211–911)

## 2013-06-16 LAB — IRON AND TIBC
Iron: 69 ug/dL (ref 42–165)
TIBC: 261 ug/dL (ref 215–435)
UIBC: 192 ug/dL (ref 125–400)

## 2013-06-16 LAB — FERRITIN: Ferritin: 151.1 ng/mL (ref 22.0–322.0)

## 2013-06-16 NOTE — Progress Notes (Signed)
Subjective:    Patient ID: Jonathan Graham, male    DOB: 01/06/1928, 79 y.o.   MRN: [id number]  HPI Patient seen for Medicare wellness exam and medical followup Medical problems include history of hyperlipidemia, aortic heart murmur, chronic left bundle branch block, osteoarthritis, BPH, and history of prostate cancer He is followed closely by urology. Has followup there in one month. He had radiation therapy within the past year. His PSA topped out at 7.8 and is down to 5.23 today. No obstructive urinary symptoms. Needs flu vaccine. Other immunizations up-to-date. He cannot confirm tetanus but thinks within the past 10 years. meds reviewed and compliant with all.  On Simvastatin for hyperlipidemia. No myalgia.  Past Medical History  Diagnosis Date  . Hypercholesterolemia   . Atypical chest pain     history of   . Left bundle branch block   . History of mononucleosis   . Weakness     history of   . Cardiovascular stress test abnormal 04/25/2004    Abnormal adenosine Cardiolite study / evidence of inferoseptal ischemia &  an EF of 48% ""recommended that he undergo cardiac catheterization""  . Shortness of breath   . Fatigue     history of   . Cervical spine disease   . Spondylosis, cervical     at C3-C4  . Pleurisy 07/2005  . Dizziness     occasionally apon bending over  . Varicose veins   . Back pain   . Arthritis   . S/P radiation therapy  12/05/2012 through 01/30/2013                                                          Prostate 7800 cGy 40 sessions, seminal vesicles 5600 cGy 40 sessions                        Past Surgical History  Procedure Laterality Date  . Cardiac catheterization  2005    EF estimated at 50% /  PROCEDURE:  Left heart catheterization, coronary and left ventricular angiography /  RAO view demonstrates mild LV enlargement  / mild hypokinesia anteroseptal with  overall low normal LV systolic function / Normal coronary anatomy  . Back surgery  1995      2 ruptured discs in lower back / by Dr. Otelia Sergeant  . Tonsillectomy and adenoidectomy    . Hemorrhoid surgery    . Prostate biopsy  08/25/2012    Gleason 3+3=6 PSA 7.80    reports that he quit smoking about 61 years ago. His smoking use included Cigarettes. He has a 7 pack-year smoking history. He does not have any smokeless tobacco history on file. He reports that he does not drink alcohol or use illicit drugs. family history includes Cancer in his father; Heart disease in his mother; Heart failure in his mother; Lung cancer in his brother; Multiple sclerosis in his sister; Stroke in his father. Allergies  Allergen Reactions  . Lipitor [Atorvastatin Calcium]     cramps   1.  Risk factors based on Past Medical , Social, and Family history reviewed, as above. 2.  Limitations in physical activities no falls. No exercise. 3.  Depression/mood stable. 4.  Hearing chronic loss with hearing aids. 5.  ADLs independent in all. 6.  Cognitive function (orientation to time and place, language, writing, speech,memory)no memory deficit.  Language and judgement intact. 7.  Home Safety no issues 8.  Height, weight, and visual acuity.all stable 9.  Counseling weight bearing exercise.  10. Recommendation of preventive services. Flu vaccine 11. Labs based on risk factors iron studies and B12 to evaluate anemia. 12. Care Plan as above.     Review of Systems  Constitutional: Negative for fever, activity change, appetite change, fatigue and unexpected weight change.  HENT: Negative for congestion, ear pain and trouble swallowing.   Eyes: Negative for pain and visual disturbance.  Respiratory: Negative for cough, shortness of breath and wheezing.   Cardiovascular: Negative for chest pain and palpitations.  Gastrointestinal: Negative for nausea, vomiting, abdominal pain, diarrhea, constipation, blood in stool, abdominal distention and rectal pain.  Endocrine: Negative for polydipsia and polyuria.   Genitourinary: Negative for dysuria, hematuria and testicular pain.  Musculoskeletal: Negative for arthralgias and joint swelling.  Skin: Negative for rash.  Neurological: Negative for dizziness, syncope and headaches.  Hematological: Negative for adenopathy.  Psychiatric/Behavioral: Negative for confusion and dysphoric mood.       Objective:   Physical Exam  Constitutional: He is oriented to person, place, and time. He appears well-developed and well-nourished. No distress.  HENT:  Head: Normocephalic and atraumatic.  Right Ear: External ear normal.  Left Ear: External ear normal.  Mouth/Throat: Oropharynx is clear and moist.  Eyes: Conjunctivae and EOM are normal. Pupils are equal, round, and reactive to light.  Neck: Normal range of motion. Neck supple. No thyromegaly present.  Cardiovascular: Normal rate, regular rhythm and normal heart sounds.   No murmur heard. Pulmonary/Chest: No respiratory distress. He has no wheezes. He has no rales.  Abdominal: Soft. Bowel sounds are normal. He exhibits no distension and no mass. There is no tenderness. There is no rebound and no guarding.  Genitourinary: Rectum normal and prostate normal.  Musculoskeletal: He exhibits no edema.  Lymphadenopathy:    He has no cervical adenopathy.  Neurological: He is alert and oriented to person, place, and time. He displays normal reflexes. No cranial nerve deficit.  Skin: No rash noted.  Psychiatric: He has a normal mood and affect.          Assessment & Plan:  #1 health maintenance. Flu vaccine given. Labs reviewed with patient. He has slightly low hemoglobin of 12.0 as below. PSA 5.23-which is down following radiation treatment for his prostate cancer. #2 hyperlipidemia. Well controlled. Continue simvastatin #3 BPH symptomatically stable. Continue Flomax #4 normocytic anemia. He also has slightly decreased white blood cell count 4.4 thousand. Check Hemoccult cards , B12, TIBC, serum iron, and  ferritin. If all normal, consider repeat CBC in about 3 months

## 2013-06-16 NOTE — Patient Instructions (Signed)
Complete hemoccult cards.

## 2013-06-18 DIAGNOSIS — D649 Anemia, unspecified: Secondary | ICD-10-CM | POA: Insufficient documentation

## 2013-06-21 ENCOUNTER — Other Ambulatory Visit (INDEPENDENT_AMBULATORY_CARE_PROVIDER_SITE_OTHER): Payer: Medicare Other

## 2013-06-21 ENCOUNTER — Encounter: Payer: Self-pay | Admitting: Family Medicine

## 2013-06-21 DIAGNOSIS — D649 Anemia, unspecified: Secondary | ICD-10-CM

## 2013-06-21 LAB — POC HEMOCCULT BLD/STL (HOME/3-CARD/SCREEN)
Card #1 Date: NEGATIVE
Card #2 Date: NEGATIVE
Card #2 Fecal Occult Blod, POC: NEGATIVE
Card #3 Date: NEGATIVE
Card #3 Fecal Occult Blood, POC: NEGATIVE
Fecal Occult Blood, POC: NEGATIVE

## 2013-10-02 ENCOUNTER — Other Ambulatory Visit: Payer: Self-pay | Admitting: Cardiology

## 2013-11-05 ENCOUNTER — Other Ambulatory Visit: Payer: Self-pay | Admitting: Cardiology

## 2013-12-04 ENCOUNTER — Other Ambulatory Visit: Payer: Self-pay | Admitting: Cardiology

## 2013-12-27 ENCOUNTER — Telehealth: Payer: Self-pay | Admitting: Family Medicine

## 2013-12-27 NOTE — Telephone Encounter (Signed)
Pt  Wife would like montrice to call her back concern ?md. Pt wife would not elaborate

## 2013-12-27 NOTE — Telephone Encounter (Signed)
Spoke with patient wife. 

## 2014-01-05 ENCOUNTER — Other Ambulatory Visit: Payer: Self-pay | Admitting: Cardiology

## 2014-03-15 ENCOUNTER — Telehealth: Payer: Self-pay | Admitting: Cardiology

## 2014-03-15 DIAGNOSIS — E7800 Pure hypercholesterolemia, unspecified: Secondary | ICD-10-CM

## 2014-03-15 DIAGNOSIS — Z79899 Other long term (current) drug therapy: Secondary | ICD-10-CM

## 2014-03-15 NOTE — Telephone Encounter (Signed)
Patients wife calling in to ask that lab order be put in the system for lab work tomorrow. Please put in lab order.

## 2014-03-15 NOTE — Telephone Encounter (Signed)
Orders in Epic.

## 2014-03-16 ENCOUNTER — Other Ambulatory Visit (INDEPENDENT_AMBULATORY_CARE_PROVIDER_SITE_OTHER): Payer: Medicare Other

## 2014-03-16 DIAGNOSIS — E7800 Pure hypercholesterolemia, unspecified: Secondary | ICD-10-CM

## 2014-03-16 DIAGNOSIS — Z79899 Other long term (current) drug therapy: Secondary | ICD-10-CM

## 2014-03-16 LAB — CBC WITH DIFFERENTIAL/PLATELET
Basophils Absolute: 0 10*3/uL (ref 0.0–0.1)
Basophils Relative: 0.8 % (ref 0.0–3.0)
EOS ABS: 0.4 10*3/uL (ref 0.0–0.7)
Eosinophils Relative: 8.3 % — ABNORMAL HIGH (ref 0.0–5.0)
HCT: 38.2 % — ABNORMAL LOW (ref 39.0–52.0)
Hemoglobin: 12.8 g/dL — ABNORMAL LOW (ref 13.0–17.0)
Lymphocytes Relative: 19.4 % (ref 12.0–46.0)
Lymphs Abs: 0.9 10*3/uL (ref 0.7–4.0)
MCHC: 33.5 g/dL (ref 30.0–36.0)
MCV: 90.2 fl (ref 78.0–100.0)
MONO ABS: 0.5 10*3/uL (ref 0.1–1.0)
Monocytes Relative: 11.3 % (ref 3.0–12.0)
NEUTROS PCT: 60.2 % (ref 43.0–77.0)
Neutro Abs: 2.8 10*3/uL (ref 1.4–7.7)
PLATELETS: 224 10*3/uL (ref 150.0–400.0)
RBC: 4.23 Mil/uL (ref 4.22–5.81)
RDW: 14.2 % (ref 11.5–15.5)
WBC: 4.7 10*3/uL (ref 4.0–10.5)

## 2014-03-16 LAB — LIPID PANEL
Cholesterol: 173 mg/dL (ref 0–200)
HDL: 54 mg/dL (ref >39.00)
LDL CALC: 109 mg/dL — AB (ref 0–99)
NonHDL: 119
TRIGLYCERIDES: 49 mg/dL (ref 0.0–149.0)
Total CHOL/HDL Ratio: 3
VLDL: 9.8 mg/dL (ref 0.0–40.0)

## 2014-03-16 LAB — BASIC METABOLIC PANEL
BUN: 22 mg/dL (ref 6–23)
CHLORIDE: 106 meq/L (ref 96–112)
CO2: 25 meq/L (ref 19–32)
CREATININE: 1 mg/dL (ref 0.4–1.5)
Calcium: 9 mg/dL (ref 8.4–10.5)
GFR: 75.57 mL/min (ref >60.00)
Glucose, Bld: 89 mg/dL (ref 70–99)
Potassium: 4.1 mEq/L (ref 3.5–5.1)
Sodium: 138 mEq/L (ref 135–145)

## 2014-03-16 LAB — HEPATIC FUNCTION PANEL
ALT: 12 U/L (ref 0–53)
AST: 18 U/L (ref 0–37)
Albumin: 4.2 g/dL (ref 3.5–5.2)
Alkaline Phosphatase: 76 U/L (ref 39–117)
BILIRUBIN DIRECT: 0.1 mg/dL (ref 0.0–0.3)
Total Bilirubin: 0.8 mg/dL (ref 0.2–1.2)
Total Protein: 7 g/dL (ref 6.0–8.3)

## 2014-03-16 NOTE — Progress Notes (Signed)
Quick Note:  Please make copy of labs for patient visit. ______ 

## 2014-03-19 ENCOUNTER — Ambulatory Visit (INDEPENDENT_AMBULATORY_CARE_PROVIDER_SITE_OTHER): Payer: Medicare Other | Admitting: Cardiology

## 2014-03-19 ENCOUNTER — Encounter: Payer: Self-pay | Admitting: Cardiology

## 2014-03-19 VITALS — BP 110/62 | HR 71 | Ht 72.0 in | Wt 178.1 lb

## 2014-03-19 DIAGNOSIS — I358 Other nonrheumatic aortic valve disorders: Secondary | ICD-10-CM

## 2014-03-19 DIAGNOSIS — C61 Malignant neoplasm of prostate: Secondary | ICD-10-CM

## 2014-03-19 DIAGNOSIS — I447 Left bundle-branch block, unspecified: Secondary | ICD-10-CM

## 2014-03-19 DIAGNOSIS — I359 Nonrheumatic aortic valve disorder, unspecified: Secondary | ICD-10-CM

## 2014-03-19 DIAGNOSIS — I350 Nonrheumatic aortic (valve) stenosis: Secondary | ICD-10-CM

## 2014-03-19 DIAGNOSIS — E7800 Pure hypercholesterolemia, unspecified: Secondary | ICD-10-CM

## 2014-03-19 NOTE — Assessment & Plan Note (Signed)
No dizziness or syncope.

## 2014-03-19 NOTE — Assessment & Plan Note (Signed)
Patient has mild aortic stenosis.  He is not having symptoms of angina, heart failure, or exertional dizziness or syncope

## 2014-03-19 NOTE — Progress Notes (Signed)
Jonathan Graham Date of Birth:  May 31, 1928 [place] [address] [place], [place]  [address] [phone]        Fax   [phone]   History of Present Illness: This pleasant 80 year old gentleman is seen for a six-month followup office visit. He has not been experiencing any new cardiac symptoms. He does have a history of known left bundle branch block and a history of hypercholesterolemia. He has a past history of syncope but has had no recent syncopal episodes. Last year he was noted to have a rapidly rising PSA and was diagnosed with prostate cancer. He elected to have radiation therapy and Dr. Valere Dross is his radiation oncologist.  Dr. Risa Grill. is his urologist.  He reports that his last PSA is down to 1.9  The patient has a history of mild aortic stenosis. He had an echocardiogram in 07/26/12 showing mild aortic stenosis and an ejection fraction of 45%. He has known left bundle branch block.  Since last visit he has had no new cardiac symptoms   Current Outpatient Prescriptions  Medication Sig Dispense Refill  . aspirin 325 MG tablet Take 325 mg by mouth 3 (three) times a week.        . simvastatin (ZOCOR) 10 MG tablet TAKE 1/2 TABLET BY MOUTH AT BEDTIME  15 tablet  5  . tamsulosin (FLOMAX) 0.4 MG CAPS Take 1 capsule (0.4 mg total) by mouth daily.  30 capsule  6   No current facility-administered medications for this visit.    Allergies  Allergen Reactions  . Lipitor [Atorvastatin Calcium]     cramps    Patient Active Problem List   Diagnosis Date Noted  . Left bundle branch block     Priority: Medium  . Anemia 06/18/2013  . Prostate cancer 10/19/2012  . Heart murmur, aortic 07/21/2012  . Echocardiogram shows left ventricular diastolic dysfunction 72/53/6644  . Osteoarthritis of right knee 01/23/2011  . Varicose vein of leg 01/23/2011  . Hypercholesterolemia   . Dizziness     History  Smoking status  . Former Smoker -- 1.00 packs/day for 7  years  . Types: Cigarettes  . Quit date: 09/01/1951  Smokeless tobacco  . Not on file    History  Alcohol Use No    Family History  Problem Relation Age of Onset  . Stroke Father   . Cancer Father   . Heart failure Mother   . Heart disease Mother   . Multiple sclerosis Sister   . Lung cancer Brother     Review of Systems: Constitutional: no fever chills diaphoresis or fatigue or change in weight.  Head and neck: no hearing loss, no epistaxis, no photophobia or visual disturbance. Respiratory: No cough, shortness of breath or wheezing. Cardiovascular: No chest pain peripheral edema, palpitations. Gastrointestinal: No abdominal distention, no abdominal pain, no change in bowel habits hematochezia or melena. Genitourinary: No dysuria, no frequency, no urgency, no nocturia. Musculoskeletal:No arthralgias, no back pain, no gait disturbance or myalgias. Neurological: No dizziness, no headaches, no numbness, no seizures, no syncope, no weakness, no tremors. Hematologic: No lymphadenopathy, no easy bruising. Psychiatric: No confusion, no hallucinations, no sleep disturbance.    Physical Exam: Filed Vitals:   03/19/14 1449  BP: 110/62  Pulse: 71   the general appearance reveals a well-developed well-nourished elderly gentleman in no distress.The head and neck exam reveals pupils equal and reactive.  Extraocular movements are full.  There is no scleral icterus.  The mouth and pharynx are normal.  The neck is supple.  The carotids reveal no bruits.  The jugular venous pressure is normal.  The  thyroid is not enlarged.  There is no lymphadenopathy.  The chest is clear to percussion and auscultation.  There are no rales or rhonchi.  Expansion of the chest is symmetrical.  The precordium is quiet.  The first heart sound is normal.  The second heart sound is physiologically split.  There is a soft systolic ejection murmur at the base.  There is no abnormal lift or heave.  The abdomen is soft  and nontender.  The bowel sounds are normal.  The liver and spleen are not enlarged.  There are no abdominal masses.  There are no abdominal bruits.  Extremities reveal good pedal pulses.  There is no phlebitis or edema.  There is no cyanosis or clubbing.  Strength is normal and symmetrical in all extremities.  There is no lateralizing weakness.  There are no sensory deficits.  The skin is warm and dry.  There is no rash.   EKG shows normal sinus rhythm and left bundle branch block unchanged from 07/21/12  Assessment / Plan: 1. left bundle-branch block 2. Hypercholesterolemia 3. prostate cancer treated with radiation therapy 4. mild aortic stenosis  Plan: Continue same medication.  Recheck in 8 months for office visit lipid panel hepatic function panel and basal metabolic panel

## 2014-03-19 NOTE — Patient Instructions (Signed)
Your physician recommends that you continue on your current medications as directed. Please refer to the Current Medication list given to you today.  Your physician wants you to follow-up in: 8 months with fasting labs (lp/bmet/hfp)  You will receive a reminder letter in the mail two months in advance. If you don't receive a letter, please call our office to schedule the follow-up appointment.

## 2014-03-19 NOTE — Assessment & Plan Note (Signed)
Prostate cancer was treated with irradiation.  His last PSA is down to 1.9.

## 2014-06-01 ENCOUNTER — Telehealth: Payer: Self-pay | Admitting: *Deleted

## 2014-06-01 NOTE — Telephone Encounter (Signed)
Wife called requesting patient be sent to ENT for ear/hearing evaluation. Will send information to Dr Carolin Coy

## 2014-06-05 NOTE — Telephone Encounter (Signed)
Spoke with Dr Berle Mull office and they have left message for patient to call for appointment. Spoke with wife and she has been in contact with office

## 2014-06-24 ENCOUNTER — Encounter (HOSPITAL_COMMUNITY): Payer: Self-pay | Admitting: Emergency Medicine

## 2014-06-24 ENCOUNTER — Emergency Department (HOSPITAL_COMMUNITY)
Admission: EM | Admit: 2014-06-24 | Discharge: 2014-06-24 | Disposition: A | Discharge status: Home / Self Care | Payer: Medicare Other | Attending: Emergency Medicine | Admitting: Emergency Medicine

## 2014-06-24 ENCOUNTER — Emergency Department (HOSPITAL_COMMUNITY): Payer: Medicare Other

## 2014-06-24 DIAGNOSIS — Z8619 Personal history of other infectious and parasitic diseases: Secondary | ICD-10-CM | POA: Insufficient documentation

## 2014-06-24 DIAGNOSIS — R5383 Other fatigue: Secondary | ICD-10-CM | POA: Diagnosis not present

## 2014-06-24 DIAGNOSIS — R109 Unspecified abdominal pain: Secondary | ICD-10-CM | POA: Insufficient documentation

## 2014-06-24 DIAGNOSIS — Z5189 Encounter for other specified aftercare: Secondary | ICD-10-CM | POA: Insufficient documentation

## 2014-06-24 DIAGNOSIS — R000 Tachycardia, unspecified: Secondary | ICD-10-CM | POA: Insufficient documentation

## 2014-06-24 DIAGNOSIS — R110 Nausea: Secondary | ICD-10-CM | POA: Insufficient documentation

## 2014-06-24 DIAGNOSIS — I959 Hypotension, unspecified: Secondary | ICD-10-CM | POA: Diagnosis not present

## 2014-06-24 DIAGNOSIS — Z87891 Personal history of nicotine dependence: Secondary | ICD-10-CM | POA: Insufficient documentation

## 2014-06-24 DIAGNOSIS — M1990 Unspecified osteoarthritis, unspecified site: Secondary | ICD-10-CM | POA: Insufficient documentation

## 2014-06-24 DIAGNOSIS — E780 Pure hypercholesterolemia: Secondary | ICD-10-CM | POA: Insufficient documentation

## 2014-06-24 DIAGNOSIS — R509 Fever, unspecified: Secondary | ICD-10-CM | POA: Insufficient documentation

## 2014-06-24 DIAGNOSIS — Z79899 Other long term (current) drug therapy: Secondary | ICD-10-CM | POA: Diagnosis not present

## 2014-06-24 DIAGNOSIS — Z9889 Other specified postprocedural states: Secondary | ICD-10-CM | POA: Insufficient documentation

## 2014-06-24 LAB — CBC WITH DIFFERENTIAL/PLATELET
BASOS PCT: 1 % (ref 0–1)
Basophils Absolute: 0 10*3/uL (ref 0.0–0.1)
EOS PCT: 1 % (ref 0–5)
Eosinophils Absolute: 0.1 10*3/uL (ref 0.0–0.7)
HEMATOCRIT: 35.4 % — AB (ref 39.0–52.0)
HEMOGLOBIN: 12 g/dL — AB (ref 13.0–17.0)
Lymphocytes Relative: 3 % — ABNORMAL LOW (ref 12–46)
Lymphs Abs: 0.3 10*3/uL — ABNORMAL LOW (ref 0.7–4.0)
MCH: 29.6 pg (ref 26.0–34.0)
MCHC: 33.9 g/dL (ref 30.0–36.0)
MCV: 87.4 fL (ref 78.0–100.0)
MONO ABS: 0.3 10*3/uL (ref 0.1–1.0)
MONOS PCT: 4 % (ref 3–12)
Neutro Abs: 7.4 10*3/uL (ref 1.7–7.7)
Neutrophils Relative %: 91 % — ABNORMAL HIGH (ref 43–77)
Platelets: 206 10*3/uL (ref 150–400)
RBC: 4.05 MIL/uL — ABNORMAL LOW (ref 4.22–5.81)
RDW: 13.6 % (ref 11.5–15.5)
WBC: 8.2 10*3/uL (ref 4.0–10.5)

## 2014-06-24 LAB — COMPREHENSIVE METABOLIC PANEL
ALBUMIN: 3.5 g/dL (ref 3.5–5.2)
ALT: 10 U/L (ref 0–53)
ANION GAP: 15 (ref 5–15)
AST: 18 U/L (ref 0–37)
Alkaline Phosphatase: 85 U/L (ref 39–117)
BUN: 24 mg/dL — AB (ref 6–23)
CALCIUM: 8.9 mg/dL (ref 8.4–10.5)
CO2: 20 mEq/L (ref 19–32)
CREATININE: 1.1 mg/dL (ref 0.50–1.35)
Chloride: 100 mEq/L (ref 96–112)
GFR calc Af Amer: 69 mL/min — ABNORMAL LOW (ref >90)
GFR, EST NON AFRICAN AMERICAN: 60 mL/min — AB (ref >90)
Glucose, Bld: 128 mg/dL — ABNORMAL HIGH (ref 70–99)
Potassium: 3.9 mEq/L (ref 3.7–5.3)
Sodium: 135 mEq/L — ABNORMAL LOW (ref 137–147)
Total Bilirubin: 0.5 mg/dL (ref 0.3–1.2)
Total Protein: 6.6 g/dL (ref 6.0–8.3)

## 2014-06-24 LAB — URINALYSIS, ROUTINE W REFLEX MICROSCOPIC
Bilirubin Urine: NEGATIVE
GLUCOSE, UA: NEGATIVE mg/dL
Hgb urine dipstick: NEGATIVE
Ketones, ur: NEGATIVE mg/dL
LEUKOCYTES UA: NEGATIVE
Nitrite: NEGATIVE
Protein, ur: NEGATIVE mg/dL
Specific Gravity, Urine: 1.019 (ref 1.005–1.030)
Urobilinogen, UA: 0.2 mg/dL (ref 0.0–1.0)
pH: 5 (ref 5.0–8.0)

## 2014-06-24 LAB — TROPONIN I

## 2014-06-24 LAB — I-STAT CG4 LACTIC ACID, ED: Lactic Acid, Venous: 1.39 mmol/L (ref 0.5–2.2)

## 2014-06-24 LAB — LIPASE, BLOOD: Lipase: 18 U/L (ref 11–59)

## 2014-06-24 MED ORDER — SODIUM CHLORIDE 0.9 % IV BOLUS (SEPSIS)
500.0000 mL | INTRAVENOUS | Status: AC
  Administered 2014-06-24: 500 mL via INTRAVENOUS

## 2014-06-24 MED ORDER — SODIUM CHLORIDE 0.9 % IV BOLUS (SEPSIS)
1000.0000 mL | Freq: Once | INTRAVENOUS | Status: AC
  Administered 2014-06-24: 1000 mL via INTRAVENOUS

## 2014-06-24 NOTE — ED Notes (Signed)
Per EMS: pt from home with complaint of fatigue and fever.  Pt was moving wood outside today when he started feeling tired.  Got progressively worse throughout the day and pt called EMS.  On arrival, pt tachycardic in the 110's and was febrile at 102.6.  Pt given 1 gram of tylenol and took 648 of ASA PTA.  Pt also reports 5 episodes of diarrhea at home; sts they were formed stool.

## 2014-06-24 NOTE — ED Provider Notes (Signed)
CSN: [id number]     Arrival date & time 06/24/14  0006 History   First MD Initiated Contact with Patient 06/24/14 0015     Chief Complaint  Patient presents with  . Fever  . Tachycardia     HPI  Patient presents with concern of fatigue, fever. Patient is generally well. Patient states that approximately 10 hours ago he was moving a pile of wood.  Subsequently he developed fatigue, lower abdominal pain.  Subsequently, the patient had approximately 5 bowel movements.  Abdominal pain resolved, but increasing fatigue, fever began.  Patient denies chest pain, dyspnea, lightheadedness, syncope, vomiting, though he does endorse nausea previously. Since onset of the fatigue, no clear alleviating or exacerbating factors. Per EMS report the patient was tachycardic, febrile on arrival.  Patient received aspirin on the scene.  Past Medical History  Diagnosis Date  . Hypercholesterolemia   . Atypical chest pain     history of   . Left bundle branch block   . History of mononucleosis   . Weakness     history of   . Cardiovascular stress test abnormal 04/25/2004    Abnormal adenosine Cardiolite study / evidence of inferoseptal ischemia &  an EF of 48% ""recommended that he undergo cardiac catheterization""  . Shortness of breath   . Fatigue     history of   . Cervical spine disease   . Spondylosis, cervical     at C3-C4  . Pleurisy 07/2005  . Dizziness     occasionally apon bending over  . Varicose veins   . Back pain   . Arthritis   . S/P radiation therapy  12/05/2012 through 01/30/2013                                                          Prostate 7800 cGy 40 sessions, seminal vesicles 5600 cGy 40 sessions                        Past Surgical History  Procedure Laterality Date  . Cardiac catheterization  2005    EF estimated at 50% /  PROCEDURE:  Left heart catheterization, coronary and left ventricular angiography /  RAO view demonstrates mild LV enlargement  / mild hypokinesia  anteroseptal with  overall low normal LV systolic function / Normal coronary anatomy  . Back surgery  1995     2 ruptured discs in lower back / by Dr. Louanne Skye  . Tonsillectomy and adenoidectomy    . Hemorrhoid surgery    . Prostate biopsy  08/25/2012    Gleason 3+3=6 PSA 7.80   Family History  Problem Relation Age of Onset  . Stroke Father   . Cancer Father   . Heart failure Mother   . Heart disease Mother   . Multiple sclerosis Sister   . Lung cancer Brother    History  Substance Use Topics  . Smoking status: Former Smoker -- 1.00 packs/day for 7 years    Types: Cigarettes    Quit date: 09/01/1951  . Smokeless tobacco: Not on file  . Alcohol Use: No    Review of Systems  Constitutional: Positive for fever and fatigue.  HENT:       Per HPI, otherwise negative  Respiratory:  Per HPI, otherwise negative  Cardiovascular:       Per HPI, otherwise negative  Gastrointestinal: Positive for nausea and abdominal pain. Negative for vomiting.  Endocrine:       Negative aside from HPI  Genitourinary:       Neg aside from HPI   Musculoskeletal:       Per HPI, otherwise negative  Skin: Negative.   Neurological: Negative for syncope, weakness, numbness and headaches.      Allergies  Lipitor  Home Medications   Prior to Admission medications   Medication Sig Start Date End Date Taking? Authorizing Provider  aspirin 325 MG tablet Take 325 mg by mouth every Monday, Wednesday, and Friday.    Yes Historical Provider, MD  simvastatin (ZOCOR) 10 MG tablet Take 5 mg by mouth daily.   Yes Historical Provider, MD  tamsulosin (FLOMAX) 0.4 MG CAPS Take 1 capsule (0.4 mg total) by mouth daily. 11/10/12  Yes Rexene Edison, MD   BP 115/69  Pulse 108  Temp(Src) 100.1 F (37.8 C) (Oral)  Resp 21  SpO2 97% Physical Exam  Nursing note and vitals reviewed. Constitutional: He is oriented to person, place, and time. He appears well-developed. No distress.  HENT:  Head:  Normocephalic and atraumatic.  Eyes: Conjunctivae and EOM are normal.  Cardiovascular: Normal rate and regular rhythm.   Pulmonary/Chest: Effort normal. No stridor. No respiratory distress.  Abdominal: He exhibits no distension. There is no tenderness. There is no rebound and no guarding.  Musculoskeletal: He exhibits no edema.  Neurological: He is alert and oriented to person, place, and time.  Skin: Skin is warm and dry.  Psychiatric: He has a normal mood and affect.    ED Course  Procedures (including critical care time) Labs Review Labs Reviewed  CBC WITH DIFFERENTIAL - Abnormal; Notable for the following:    RBC 4.05 (*)    Hemoglobin 12.0 (*)    HCT 35.4 (*)    Neutrophils Relative % 91 (*)    Lymphocytes Relative 3 (*)    Lymphs Abs 0.3 (*)    All other components within normal limits  COMPREHENSIVE METABOLIC PANEL - Abnormal; Notable for the following:    Sodium 135 (*)    Glucose, Bld 128 (*)    BUN 24 (*)    GFR calc non Af Amer 60 (*)    GFR calc Af Amer 69 (*)    All other components within normal limits  URINALYSIS, ROUTINE W REFLEX MICROSCOPIC  TROPONIN I  LIPASE, BLOOD  I-STAT CG4 LACTIC ACID, ED    Imaging Review Dg Chest 2 View  06/24/2014   CLINICAL DATA:  Sudden fatigue and fever with shaking and sudden onset diarrhea. This occurred while patient was splitting and hauling logs.  EXAM: CHEST  2 VIEW  COMPARISON:  01/24/2010  FINDINGS: Emphysematous changes in the lungs. The heart size and mediastinal contours are within normal limits. Both lungs are clear. The visualized skeletal structures are unremarkable. Tortuous aorta.  IMPRESSION: Emphysematous changes.  No evidence of active pulmonary disease.   Electronically Signed   By: Lucienne Capers M.D.   On: 06/24/2014 01:40     EKG Interpretation   Date/Time:  Sunday June 24 2014 00:11:42 EDT Ventricular Rate:  112 PR Interval:  161 QRS Duration: 142 QT Interval:  336 QTC Calculation: 459 R  Axis:   83 Text Interpretation:  Sinus tachycardia LVH with secondary repolarization  abnormality Anterior infarct, acute (LAD) Sinus tachycardia ST-t  wave  abnormality Left ventricular hypertrophy depressions in ST segments  inferiorly, new since 02/2014 Abnormal ekg Confirmed by Carmin Muskrat   MD [phone]) on 06/24/2014 12:17:04 AM     I reviewed the electronic medical record.  2:19 AM Tachycardia resolved, fever resolved.  Patient has no complaints.  Patient has mild hypotension.  States that he typically has some degree of hypotension, though his current numbers are slightly lower than usual for him. Patient clarifies that prior to the onset of symptoms earlier today he performed approximately 4 hours of physical labor.   4:18 AM Patient asymptomatic.  Afebrile, 98.4 Blood pressure low, though consistent for the patient. He, his wife and I discussed all findings, the need follow-up with primary care, return precautions MDM   Final diagnoses:  Fever   Patient presents with fever, abdominal pain that resolved prior to my evaluation.patient was initially febrile, tachycardic, but his vital sign abnormalities resolve, he remains asymptomatic, and he has a soft, non-peritoneal abdomen.  Patient received fluid resuscitation, Tylenol. The patient's symptoms were likely attributable to exertion, dehydration/exhaustion. There is low suspicion for acute intra-abdominal pathology given the absence of pain, ongoing GI symptoms. Low suspicion for sepsis, bacteremia, though the patient was initially febrile. Patient's improvement is reassuring.  Patient was discharged in stable condition.    Carmin Muskrat, MD 06/24/14 [phone]

## 2014-06-24 NOTE — Discharge Instructions (Signed)
As discussed, it is important that you follow up as soon as possible with your physician for continued management of your condition.  If you develop any new, or concerning changes in your condition, please return to the emergency department immediately.  Fever, Adult A fever is a temperature of 100.4 F (38 C) or above.  HOME CARE  Take fever medicine as told by your doctor. Do not  take aspirin for fever if you are younger than 78 years of age.  If you are given antibiotic medicine, take it as told. Finish the medicine even if you start to feel better.  Rest.  Drink enough fluids to keep your pee (urine) clear or pale yellow. Do not drink alcohol.  Take a bath or shower with room temperature water. Do not use ice water or alcohol sponge baths.  Wear lightweight, loose clothes. GET HELP RIGHT AWAY IF:   You are short of breath or have trouble breathing.  You are very weak.  You are dizzy or you pass out (faint).  You are very thirsty or are making little or no urine.  You have new pain.  You throw up (vomit) or have watery poop (diarrhea).  You keep throwing up or having watery poop for more than 1 to 2 days.  You have a stiff neck or light bothers your eyes.  You have a skin rash.  You have a fever or problems (symptoms) that last for more than 2 to 3 days.  You have a fever and your problems quickly get worse.  You keep throwing up the fluids you drink.  You do not feel better after 3 days.  You have new problems. MAKE SURE YOU:   Understand these instructions.  Will watch your condition.  Will get help right away if you are not doing well or get worse. Document Released: 05/26/2008 Document Revised: 11/09/2011 Document Reviewed: 06/18/2011 [facility] Patient Information 2015 [place], [place]. This information is not intended to replace advice given to you by your health care provider. Make sure you discuss any questions you have with your health care  provider.

## 2014-06-26 ENCOUNTER — Telehealth: Payer: Self-pay | Admitting: Family Medicine

## 2014-06-26 NOTE — Telephone Encounter (Signed)
Pt was seen in er and needed post er fup. Pt has been sch

## 2014-07-02 ENCOUNTER — Ambulatory Visit (INDEPENDENT_AMBULATORY_CARE_PROVIDER_SITE_OTHER): Payer: Medicare Other

## 2014-07-02 ENCOUNTER — Ambulatory Visit (INDEPENDENT_AMBULATORY_CARE_PROVIDER_SITE_OTHER): Payer: Medicare Other | Admitting: Family Medicine

## 2014-07-02 ENCOUNTER — Encounter: Payer: Self-pay | Admitting: Family Medicine

## 2014-07-02 VITALS — BP 110/70 | HR 79 | Temp 97.7°F | Wt 177.0 lb

## 2014-07-02 DIAGNOSIS — R509 Fever, unspecified: Secondary | ICD-10-CM

## 2014-07-02 DIAGNOSIS — K5900 Constipation, unspecified: Secondary | ICD-10-CM

## 2014-07-02 DIAGNOSIS — Z23 Encounter for immunization: Secondary | ICD-10-CM

## 2014-07-02 NOTE — Progress Notes (Signed)
Subjective:    Patient ID: Jonathan Graham, male    DOB: 29-Mar-1928, 79 y.o.   MRN: [id number]  HPI Patient is here for ER follow-up. On 06/23/2014 he was outside several hours moving a pile of wood. He subsequently developed increased fatigue and lower abdominal pain and fever up to 102.6. He presented to emergency room for further evaluation. He had low-grade fever on presentation to ER. Patient states that he also had about 4 to 5 fairly large volume bowel movements prior to presenting to emergency department. His abdominal pain had improved by time he presented there. He was slightly hypotensive and felt to be somewhat dehydrated. Urinalysis unremarkable. White blood count normal. Hemoglobin 12.0 which is stable for him. Chemistries were basically unremarkable. Chest x-ray revealed no acute findings. His fever and tachycardia had resolved after IV fluids. No abdominal imaging was done. Patient was sent home and has done well since then.  He does have frequent constipation issues. He frequently goes for 5 days without bowel movement. Poor fiber intake. Poor fluid intake.  Past Medical History  Diagnosis Date  . Hypercholesterolemia   . Atypical chest pain     history of   . Left bundle branch block   . History of mononucleosis   . Weakness     history of   . Cardiovascular stress test abnormal 04/25/2004    Abnormal adenosine Cardiolite study / evidence of inferoseptal ischemia &  an EF of 48% ""recommended that he undergo cardiac catheterization""  . Shortness of breath   . Fatigue     history of   . Cervical spine disease   . Spondylosis, cervical     at C3-C4  . Pleurisy 07/2005  . Dizziness     occasionally apon bending over  . Varicose veins   . Back pain   . Arthritis   . S/P radiation therapy  12/05/2012 through 01/30/2013                                                          Prostate 7800 cGy 40 sessions, seminal vesicles 5600 cGy 40 sessions                         Past Surgical History  Procedure Laterality Date  . Cardiac catheterization  2005    EF estimated at 50% /  PROCEDURE:  Left heart catheterization, coronary and left ventricular angiography /  RAO view demonstrates mild LV enlargement  / mild hypokinesia anteroseptal with  overall low normal LV systolic function / Normal coronary anatomy  . Back surgery  1995     2 ruptured discs in lower back / by Dr. Louanne Skye  . Tonsillectomy and adenoidectomy    . Hemorrhoid surgery    . Prostate biopsy  08/25/2012    Gleason 3+3=6 PSA 7.80    reports that he quit smoking about 62 years ago. His smoking use included Cigarettes. He has a 7 pack-year smoking history. He does not have any smokeless tobacco history on file. He reports that he does not drink alcohol or use illicit drugs. family history includes Cancer in his father; Heart disease in his mother; Heart failure in his mother; Lung cancer in his brother; Multiple sclerosis in his sister; Stroke in his  father. Allergies  Allergen Reactions  . Lipitor [Atorvastatin Calcium]     cramps      Review of Systems  Constitutional: Negative for fever, chills and fatigue.  Respiratory: Negative for cough.   Cardiovascular: Negative for chest pain.  Gastrointestinal: Positive for constipation. Negative for nausea, vomiting, abdominal pain and blood in stool.  Genitourinary: Negative for dysuria and hematuria.  Neurological: Negative for headaches.  Hematological: Negative for adenopathy.  Psychiatric/Behavioral: Negative for confusion.       Objective:   Physical Exam  Constitutional: He appears well-developed and well-nourished.  HENT:  Mouth/Throat: Oropharynx is clear and moist.  Neck: Neck supple.  Cardiovascular: Normal rate and regular rhythm.   Pulmonary/Chest: Effort normal and breath sounds normal. No respiratory distress. He has no wheezes. He has no rales.  Abdominal: Soft. Bowel sounds are normal. He exhibits no distension and no  mass. There is no tenderness. There is no rebound and no guarding.  Musculoskeletal: He exhibits no edema.  Skin: No rash noted.          Assessment & Plan:  Patient had recent fever with nonspecific lower abdominal pain. Following several bowel movements his fever and abdominal pain resolved. Suspect he may have had fever secondary to impaction.  He did not have any other clear source for his fever and symptoms promptly resolved after several bowel movements. He is doing well this time and that we recommend observation with no further evaluation unless he has recurrent fever. We discussed measures to reduce constipation. Flu vaccine given.

## 2014-07-02 NOTE — Progress Notes (Signed)
Pre visit review using our clinic review tool, if applicable. No additional management support is needed unless otherwise documented below in the visit note. 

## 2014-07-02 NOTE — Patient Instructions (Signed)
Constipation  Constipation is when a person has fewer than three bowel movements a week, has difficulty having a bowel movement, or has stools that are dry, hard, or larger than normal. As people grow older, constipation is more common. If you try to fix constipation with medicines that make you have a bowel movement (laxatives), the problem may get worse. Long-term laxative use may cause the muscles of the colon to become weak. A low-fiber diet, not taking in enough fluids, and taking certain medicines may make constipation worse.   CAUSES   · Certain medicines, such as antidepressants, pain medicine, iron supplements, antacids, and water pills.    · Certain diseases, such as diabetes, irritable bowel syndrome (IBS), thyroid disease, or depression.    · Not drinking enough water.    · Not eating enough fiber-rich foods.    · Stress or travel.    · Lack of physical activity or exercise.    · Ignoring the urge to have a bowel movement.    · Using laxatives too much.    SIGNS AND SYMPTOMS   · Having fewer than three bowel movements a week.    · Straining to have a bowel movement.    · Having stools that are hard, dry, or larger than normal.    · Feeling full or bloated.    · Pain in the lower abdomen.    · Not feeling relief after having a bowel movement.    DIAGNOSIS   Your health care provider will take a medical history and perform a physical exam. Further testing may be done for severe constipation. Some tests may include:  · A barium enema X-ray to examine your rectum, colon, and, sometimes, your small intestine.    · A sigmoidoscopy to examine your lower colon.    · A colonoscopy to examine your entire colon.  TREATMENT   Treatment will depend on the severity of your constipation and what is causing it. Some dietary treatments include drinking more fluids and eating more fiber-rich foods. Lifestyle treatments may include regular exercise. If these diet and lifestyle recommendations do not help, your health care  provider may recommend taking over-the-counter laxative medicines to help you have bowel movements. Prescription medicines may be prescribed if over-the-counter medicines do not work.   HOME CARE INSTRUCTIONS   · Eat foods that have a lot of fiber, such as fruits, vegetables, whole grains, and beans.  · Limit foods high in fat and processed sugars, such as french fries, hamburgers, cookies, candies, and soda.    · A fiber supplement may be added to your diet if you cannot get enough fiber from foods.    · Drink enough fluids to keep your urine clear or pale yellow.    · Exercise regularly or as directed by your health care provider.    · Go to the restroom when you have the urge to go. Do not hold it.    · Only take over-the-counter or prescription medicines as directed by your health care provider. Do not take other medicines for constipation without talking to your health care provider first.    SEEK IMMEDIATE MEDICAL CARE IF:   · You have bright red blood in your stool.    · Your constipation lasts for more than 4 days or gets worse.    · You have abdominal or rectal pain.    · You have thin, pencil-like stools.    · You have unexplained weight loss.  MAKE SURE YOU:   · Understand these instructions.  · Will watch your condition.  · Will get help right away if you are not   you have with your health care provider.  Increase fluid and fiber intake (25-30 gram per day) Follow up promptly for any recurrent fever.

## 2014-07-05 ENCOUNTER — Other Ambulatory Visit: Payer: Self-pay | Admitting: Cardiology

## 2014-09-11 DIAGNOSIS — C61 Malignant neoplasm of prostate: Secondary | ICD-10-CM | POA: Diagnosis not present

## 2014-09-18 DIAGNOSIS — N3281 Overactive bladder: Secondary | ICD-10-CM | POA: Diagnosis not present

## 2014-09-18 DIAGNOSIS — C61 Malignant neoplasm of prostate: Secondary | ICD-10-CM | POA: Diagnosis not present

## 2014-10-16 ENCOUNTER — Other Ambulatory Visit (INDEPENDENT_AMBULATORY_CARE_PROVIDER_SITE_OTHER): Payer: Medicare Other

## 2014-10-16 DIAGNOSIS — E7800 Pure hypercholesterolemia, unspecified: Secondary | ICD-10-CM

## 2014-10-16 LAB — BASIC METABOLIC PANEL
BUN: 22 mg/dL (ref 6–23)
CALCIUM: 9.1 mg/dL (ref 8.4–10.5)
CHLORIDE: 105 meq/L (ref 96–112)
CO2: 27 mEq/L (ref 19–32)
Creatinine, Ser: 1.16 mg/dL (ref 0.40–1.50)
GFR: 63.58 mL/min (ref >60.00)
Glucose, Bld: 88 mg/dL (ref 70–99)
Potassium: 4 mEq/L (ref 3.5–5.1)
Sodium: 138 mEq/L (ref 135–145)

## 2014-10-16 LAB — LIPID PANEL
CHOL/HDL RATIO: 3
Cholesterol: 166 mg/dL (ref 0–200)
HDL: 52.4 mg/dL (ref >39.00)
LDL Cholesterol: 100 mg/dL — ABNORMAL HIGH (ref 0–99)
NONHDL: 113.6
Triglycerides: 70 mg/dL (ref 0.0–149.0)
VLDL: 14 mg/dL (ref 0.0–40.0)

## 2014-10-16 LAB — HEPATIC FUNCTION PANEL
ALBUMIN: 4.3 g/dL (ref 3.5–5.2)
ALT: 12 U/L (ref 0–53)
AST: 19 U/L (ref 0–37)
Alkaline Phosphatase: 82 U/L (ref 39–117)
Bilirubin, Direct: 0.1 mg/dL (ref 0.0–0.3)
Total Bilirubin: 0.6 mg/dL (ref 0.2–1.2)
Total Protein: 7.3 g/dL (ref 6.0–8.3)

## 2014-10-16 NOTE — Progress Notes (Signed)
Quick Note:  Please make copy of labs for patient visit. ______ 

## 2014-10-31 ENCOUNTER — Encounter: Payer: Self-pay | Admitting: Cardiology

## 2014-10-31 ENCOUNTER — Ambulatory Visit (INDEPENDENT_AMBULATORY_CARE_PROVIDER_SITE_OTHER): Payer: Medicare Other | Admitting: Cardiology

## 2014-10-31 VITALS — BP 124/66 | HR 63 | Ht 72.0 in | Wt 176.0 lb

## 2014-10-31 DIAGNOSIS — I447 Left bundle-branch block, unspecified: Secondary | ICD-10-CM

## 2014-10-31 DIAGNOSIS — I358 Other nonrheumatic aortic valve disorders: Secondary | ICD-10-CM | POA: Diagnosis not present

## 2014-10-31 DIAGNOSIS — E7800 Pure hypercholesterolemia, unspecified: Secondary | ICD-10-CM

## 2014-10-31 NOTE — Progress Notes (Signed)
Cardiology Office Note   Date:  10/31/2014   ID:  Jonathan Graham, DOB [birth year], MRN [id number]  PCP:  [name], MD  Cardiologist:   Darlin Coco, MD   No chief complaint on file.     History of Present Illness: Jonathan Graham is a 81 y.o. male who presents for scheduled follow-up office visit. This pleasant 81 year old gentleman is seen for a six-month followup office visit. He has not been experiencing any new cardiac symptoms. He does have a history of known left bundle branch block and a history of hypercholesterolemia. He has a past history of syncope but has had no recent syncopal episodes. Last year he was noted to have a rapidly rising PSA and was diagnosed with prostate cancer. He elected to have radiation therapy and Dr. Valere Dross is his radiation oncologist. Dr. Risa Grill. is his urologist. He reports that his last PSA remains very low.  The patient has a history of mild aortic stenosis. He had an echocardiogram in 07/26/12 showing mild aortic stenosis and an ejection fraction of 45%.  A previous echocardiogram in 2007 at shown an ejection fraction of 55-60% with moderate aortic sclerosis.  He has known left bundle branch block. Since last visit he has had no new cardiac symptoms.  He stays physically active and enjoys working in his yard.   Past Medical History  Diagnosis Date  . Hypercholesterolemia   . Atypical chest pain     history of   . Left bundle branch block   . History of mononucleosis   . Weakness     history of   . Cardiovascular stress test abnormal 04/25/2004    Abnormal adenosine Cardiolite study / evidence of inferoseptal ischemia &  an EF of 48% ""recommended that he undergo cardiac catheterization""  . Shortness of breath   . Fatigue     history of   . Cervical spine disease   . Spondylosis, cervical     at C3-C4  . Pleurisy 07/2005  . Dizziness     occasionally apon bending over  . Varicose veins   . Back pain   . Arthritis     . S/P radiation therapy  12/05/2012 through 01/30/2013                                                          Prostate 7800 cGy 40 sessions, seminal vesicles 5600 cGy 40 sessions                         Past Surgical History  Procedure Laterality Date  . Cardiac catheterization  2005    EF estimated at 50% /  PROCEDURE:  Left heart catheterization, coronary and left ventricular angiography /  RAO view demonstrates mild LV enlargement  / mild hypokinesia anteroseptal with  overall low normal LV systolic function / Normal coronary anatomy  . Back surgery  1995     2 ruptured discs in lower back / by Dr. Louanne Skye  . Tonsillectomy and adenoidectomy    . Hemorrhoid surgery    . Prostate biopsy  08/25/2012    Gleason 3+3=6 PSA 7.80     Current Outpatient Prescriptions  Medication Sig Dispense Refill  . aspirin 325 MG tablet Take 325 mg by mouth every  Monday, Wednesday, and Friday.     . simvastatin (ZOCOR) 10 MG tablet Take 5 mg by mouth daily.    . simvastatin (ZOCOR) 10 MG tablet TAKE 1/2 TABLET BY MOUTH AT BEDTIME 15 tablet 5  . tamsulosin (FLOMAX) 0.4 MG CAPS Take 1 capsule (0.4 mg total) by mouth daily. 30 capsule 6   No current facility-administered medications for this visit.    Allergies:   Lipitor    Social History:  The patient  reports that he quit smoking about 63 years ago. His smoking use included Cigarettes. He has a 7 pack-year smoking history. He does not have any smokeless tobacco history on file. He reports that he does not drink alcohol or use illicit drugs.   Family History:  The patient's family history includes Cancer in his father; Heart disease in his mother; Heart failure in his mother; Lung cancer in his brother; Multiple sclerosis in his sister; Stroke in his father.    ROS:  Please see the history of present illness.   Otherwise, review of systems are positive for none.   All other systems are reviewed and negative.    PHYSICAL EXAM: VS:  BP 124/66 mmHg   Pulse 63  Ht 6' (1.829 m)  Wt 176 lb (79.833 kg)  BMI 23.86 kg/m2 , BMI Body mass index is 23.86 kg/(m^2). GEN: Well nourished, well developed, in no acute distress HEENT: normal Neck: no JVD, carotid bruits, or masses Cardiac: RRR; there is a soft systolic ejection murmur at the base.  There is no, rubs, or gallops,no edema  Respiratory:  clear to auscultation bilaterally, normal work of breathing GI: soft, nontender, nondistended, + BS MS: no deformity or atrophy Skin: warm and dry, no rash Neuro:  Strength and sensation are intact Psych: euthymic mood, full affect   EKG:  EKG is not ordered today.    Recent Labs: 06/24/2014: Hemoglobin 12.0*; Platelets 206 10/16/2014: ALT 12; BUN 22; Creatinine 1.16; Potassium 4.0; Sodium 138    Lipid Panel    Component Value Date/Time   CHOL 166 10/16/2014 0813   TRIG 70.0 10/16/2014 0813   HDL 52.40 10/16/2014 0813   CHOLHDL 3 10/16/2014 0813   VLDL 14.0 10/16/2014 0813   LDLCALC 100* 10/16/2014 0813      Wt Readings from Last 3 Encounters:  10/31/14 176 lb (79.833 kg)  07/02/14 177 lb (80.287 kg)  03/19/14 178 lb 1.9 oz (80.795 kg)         ASSESSMENT AND PLAN:  1. left bundle-branch block 2. Hypercholesterolemia 3. prostate cancer treated with radiation therapy 4. mild aortic stenosis   Current medicines are reviewed at length with the patient today.  The patient does not have concerns regarding medicines.  The following changes have been made:  no change  Labs/ tests ordered today include:  Orders Placed This Encounter  Procedures  . Lipid panel  . Hepatic function panel  . Basic metabolic panel  . 2D Echocardiogram without contrast     Disposition:   FU with Dr. Mare Ferrari in 6 months for office visit and lipid panel hepatic function panel and basal metabolic panel.  We will also arrange for 2-D echo to follow-up on his aortic stenosis.  He is not having any definite symptoms except for occasional  dizziness.   Signed, Darlin Coco, MD  10/31/2014 1:37 PM    [facility] [place], [place], [place]  [address] Phone: [phone]; Fax: [phone]

## 2014-10-31 NOTE — Patient Instructions (Signed)
Your physician recommends that you continue on your current medications as directed. Please refer to the Current Medication list given to you today.  Your physician wants you to follow-up in: 6 months with fasting labs (lp/bmet/hfp)  You will receive a reminder letter in the mail two months in advance. If you don't receive a letter, please call our office to schedule the follow-up appointment.   Your physician has requested that you have an echocardiogram. Echocardiography is a painless test that uses sound waves to create images of your heart. It provides your doctor with information about the size and shape of your heart and how well your heart's chambers and valves are working. This procedure takes approximately one hour. There are no restrictions for this procedure.

## 2014-11-02 ENCOUNTER — Ambulatory Visit (HOSPITAL_COMMUNITY): Payer: Medicare Other | Attending: Cardiology | Admitting: Cardiology

## 2014-11-02 DIAGNOSIS — I358 Other nonrheumatic aortic valve disorders: Secondary | ICD-10-CM | POA: Insufficient documentation

## 2014-11-02 DIAGNOSIS — E785 Hyperlipidemia, unspecified: Secondary | ICD-10-CM | POA: Diagnosis not present

## 2014-11-02 DIAGNOSIS — Z87891 Personal history of nicotine dependence: Secondary | ICD-10-CM | POA: Diagnosis not present

## 2014-11-02 NOTE — Progress Notes (Signed)
Echo performed. 

## 2014-11-09 ENCOUNTER — Telehealth: Payer: Self-pay | Admitting: Cardiology

## 2014-11-09 NOTE — Telephone Encounter (Signed)
PT'S WIFE  AWARE OF ECHO RESULTS./CY 

## 2014-11-09 NOTE — Telephone Encounter (Signed)
New message ° ° ° ° ° °Want echo results °

## 2014-11-12 DIAGNOSIS — Z961 Presence of intraocular lens: Secondary | ICD-10-CM | POA: Diagnosis not present

## 2014-11-12 DIAGNOSIS — H26493 Other secondary cataract, bilateral: Secondary | ICD-10-CM | POA: Diagnosis not present

## 2014-12-05 ENCOUNTER — Other Ambulatory Visit: Payer: Self-pay

## 2014-12-07 ENCOUNTER — Ambulatory Visit: Payer: Self-pay | Admitting: Cardiology

## 2014-12-07 ENCOUNTER — Other Ambulatory Visit: Payer: Self-pay

## 2015-01-08 ENCOUNTER — Other Ambulatory Visit: Payer: Self-pay

## 2015-01-08 MED ORDER — SIMVASTATIN 10 MG PO TABS: 5.0000 mg | ORAL_TABLET | Freq: Every day | ORAL | Status: DC

## 2015-02-25 ENCOUNTER — Other Ambulatory Visit: Payer: Self-pay

## 2015-03-15 DIAGNOSIS — C61 Malignant neoplasm of prostate: Secondary | ICD-10-CM | POA: Diagnosis not present

## 2015-03-26 DIAGNOSIS — C61 Malignant neoplasm of prostate: Secondary | ICD-10-CM | POA: Diagnosis not present

## 2015-06-05 ENCOUNTER — Telehealth: Payer: Self-pay | Admitting: Cardiology

## 2015-06-05 DIAGNOSIS — E7800 Pure hypercholesterolemia, unspecified: Secondary | ICD-10-CM

## 2015-06-05 NOTE — Telephone Encounter (Signed)
Left message to call back  

## 2015-06-05 NOTE — Telephone Encounter (Signed)
New Message  Pt would like labs for 11/10 appt. Please place orders if needed. Thanks

## 2015-06-06 NOTE — Telephone Encounter (Signed)
Lab orders in Epic for November appointment Per wife patient will be having labs at PCP

## 2015-06-24 ENCOUNTER — Telehealth: Payer: Self-pay | Admitting: Family Medicine

## 2015-06-24 NOTE — Telephone Encounter (Signed)
yes

## 2015-06-24 NOTE — Telephone Encounter (Signed)
Left message on vm for pt to call back.  

## 2015-06-24 NOTE — Telephone Encounter (Signed)
Pt has order for blood work in system from dr [employer] office and would like to have labs drawn here. Can I sch?

## 2015-06-26 ENCOUNTER — Ambulatory Visit: Payer: Medicare Other | Admitting: Cardiology

## 2015-06-26 ENCOUNTER — Ambulatory Visit (INDEPENDENT_AMBULATORY_CARE_PROVIDER_SITE_OTHER): Payer: Medicare Other | Admitting: Family Medicine

## 2015-06-26 DIAGNOSIS — Z23 Encounter for immunization: Secondary | ICD-10-CM

## 2015-06-27 NOTE — Telephone Encounter (Signed)
Left message on vm for pt to call back.  

## 2015-07-03 NOTE — Telephone Encounter (Signed)
Pt has been sch

## 2015-07-11 ENCOUNTER — Encounter: Payer: Self-pay | Admitting: Cardiology

## 2015-07-11 ENCOUNTER — Ambulatory Visit (INDEPENDENT_AMBULATORY_CARE_PROVIDER_SITE_OTHER): Payer: Medicare Other | Admitting: Cardiology

## 2015-07-11 VITALS — BP 110/70 | HR 64 | Ht 72.0 in | Wt 175.8 lb

## 2015-07-11 DIAGNOSIS — I447 Left bundle-branch block, unspecified: Secondary | ICD-10-CM

## 2015-07-11 DIAGNOSIS — I350 Nonrheumatic aortic (valve) stenosis: Secondary | ICD-10-CM | POA: Diagnosis not present

## 2015-07-11 DIAGNOSIS — E7800 Pure hypercholesterolemia, unspecified: Secondary | ICD-10-CM | POA: Diagnosis not present

## 2015-07-11 NOTE — Patient Instructions (Signed)
Medication Instructions:  Your physician recommends that you continue on your current medications as directed. Please refer to the Current Medication list given to you today.  Labwork: none  Testing/Procedures: none  Follow-Up: Your physician wants you to follow-up in: 6 month ov with Dr Jordan  You will receive a reminder letter in the mail two months in advance. If you don't receive a letter, please call our office to schedule the follow-up appointment.  If you need a refill on your cardiac medications before your next appointment, please call your pharmacy.   

## 2015-07-11 NOTE — Progress Notes (Signed)
Cardiology Office Note   Date:  07/11/2015   ID:  Jonathan Graham, DOB 01-10-1928, MRN [id number]  PCP:  [name], MD  Cardiologist: Darlin Coco MD  Chief Complaint  Patient presents with  . Heart Murmur      History of Present Illness: Jonathan Graham is a 81 y.o. male who presents for  Six-month follow-up visit   This pleasant 81 year old gentleman is seen for a six-month followup office visit. He has not been experiencing any new cardiac symptoms. He does have a history of known left bundle branch block and a history of hypercholesterolemia. He has a past history of syncope but has had no recent syncopal episodes. Last year he was noted to have a rapidly rising PSA and was diagnosed with prostate cancer. He elected to have radiation therapy and Dr. Valere Dross is his radiation oncologist. Dr. Risa Grill. is his urologist. He reports that his last PSA remains very low. The last level was 0.9 The patient has a history of mild aortic stenosis. He had an echocardiogram in 07/26/12 showing mild aortic stenosis and an ejection fraction of 45%. A previous echocardiogram in 2007 at shown an ejection fraction of 55-60% with moderate aortic sclerosis. He has known left bundle branch block. Since last visit he has had no new cardiac symptoms. He stays physically active and enjoys working in his yard.   Past Medical History  Diagnosis Date  . Hypercholesterolemia   . Atypical chest pain     history of   . Left bundle branch block   . History of mononucleosis   . Weakness     history of   . Cardiovascular stress test abnormal 04/25/2004    Abnormal adenosine Cardiolite study / evidence of inferoseptal ischemia &  an EF of 48% ""recommended that he undergo cardiac catheterization""  . Shortness of breath   . Fatigue     history of   . Cervical spine disease   . Spondylosis, cervical     at C3-C4  . Pleurisy 07/2005  . Dizziness     occasionally apon bending over  .  Varicose veins   . Back pain   . Arthritis   . S/P radiation therapy  12/05/2012 through 01/30/2013                                                          Prostate 7800 cGy 40 sessions, seminal vesicles 5600 cGy 40 sessions                         Past Surgical History  Procedure Laterality Date  . Cardiac catheterization  2005    EF estimated at 50% /  PROCEDURE:  Left heart catheterization, coronary and left ventricular angiography /  RAO view demonstrates mild LV enlargement  / mild hypokinesia anteroseptal with  overall low normal LV systolic function / Normal coronary anatomy  . Back surgery  1995     2 ruptured discs in lower back / by Dr. Louanne Skye  . Tonsillectomy and adenoidectomy    . Hemorrhoid surgery    . Prostate biopsy  08/25/2012    Gleason 3+3=6 PSA 7.80     Current Outpatient Prescriptions  Medication Sig Dispense Refill  . aspirin 325 MG tablet Take  325 mg by mouth every Monday, Wednesday, and Friday.     . simvastatin (ZOCOR) 10 MG tablet Take 0.5 tablets (5 mg total) by mouth at bedtime. 30 tablet 4  . tamsulosin (FLOMAX) 0.4 MG CAPS Take 1 capsule (0.4 mg total) by mouth daily. 30 capsule 6   No current facility-administered medications for this visit.    Allergies:   Lipitor    Social History:  The patient  reports that he quit smoking about 63 years ago. His smoking use included Cigarettes. He has a 7 pack-year smoking history. He does not have any smokeless tobacco history on file. He reports that he does not drink alcohol or use illicit drugs.   Family History:  The patient's family history includes Cancer in his father; Heart disease in his mother; Heart failure in his mother; Lung cancer in his brother; Multiple sclerosis in his sister; Stroke in his father.    ROS:  Please see the history of present illness.   Otherwise, review of systems are positive for none.   All other systems are reviewed and negative.    PHYSICAL EXAM: VS:  BP 110/70 mmHg   Pulse 64  Ht 6' (1.829 m)  Wt 175 lb 12.8 oz (79.742 kg)  BMI 23.84 kg/m2 , BMI Body mass index is 23.84 kg/(m^2). GEN: Well nourished, well developed, in no acute distress HEENT: normal Neck: no JVD, carotid bruits, or masses Cardiac: RRR;  There is a soft systolic ejection murmur at the base. There is no, rubs, or gallops,no edema  Respiratory:  clear to auscultation bilaterally, normal work of breathing GI: soft, nontender, nondistended, + BS MS: no deformity or atrophy Skin: warm and dry, no rash Neuro:  Strength and sensation are intact Psych: euthymic mood, full affect   EKG:  EKG is ordered today. The ekg ordered today demonstrates  Normal sinus rhythm with left bundle branch block.  Occasional PVC. Since previous tracing of 06/24/14, heart rate is slower   Recent Labs: 10/16/2014: ALT 12; BUN 22; Creatinine, Ser 1.16; Potassium 4.0; Sodium 138    Lipid Panel    Component Value Date/Time   CHOL 166 10/16/2014 0813   TRIG 70.0 10/16/2014 0813   HDL 52.40 10/16/2014 0813   CHOLHDL 3 10/16/2014 0813   VLDL 14.0 10/16/2014 0813   LDLCALC 100* 10/16/2014 0813      Wt Readings from Last 3 Encounters:  07/11/15 175 lb 12.8 oz (79.742 kg)  10/31/14 176 lb (79.833 kg)  07/02/14 177 lb (80.287 kg)        ASSESSMENT AND PLAN:  1. left bundle-branch block 2. Hypercholesterolemia 3. prostate cancer treated with radiation therapy 4. mild aortic stenosis   Current medicines are reviewed at length with the patient today.  The patient does not have concerns regarding medicines.  The following changes have been made:  no change  Labs/ tests ordered today include:   Orders Placed This Encounter  Procedures  . EKG 12-Lead     disposition: Continue current meds. Recheck in 6 months for a follow-up office visit with Dr. [name]. Consider getting echocardiogram before or after next office visit.  Berna Spare MD 07/11/2015 6:32 PM    [facility] [place], [place], [place]  [address] Phone: [phone]; Fax: [phone]

## 2015-07-16 ENCOUNTER — Other Ambulatory Visit: Payer: Medicare Other

## 2015-07-18 ENCOUNTER — Other Ambulatory Visit (INDEPENDENT_AMBULATORY_CARE_PROVIDER_SITE_OTHER): Payer: Medicare Other

## 2015-07-18 DIAGNOSIS — E7800 Pure hypercholesterolemia, unspecified: Secondary | ICD-10-CM | POA: Diagnosis not present

## 2015-07-18 LAB — HEPATIC FUNCTION PANEL
ALT: 10 U/L (ref 0–53)
AST: 18 U/L (ref 0–37)
Albumin: 4.2 g/dL (ref 3.5–5.2)
Alkaline Phosphatase: 80 U/L (ref 39–117)
BILIRUBIN DIRECT: 0.1 mg/dL (ref 0.0–0.3)
BILIRUBIN TOTAL: 0.8 mg/dL (ref 0.2–1.2)
Total Protein: 7 g/dL (ref 6.0–8.3)

## 2015-07-18 LAB — BASIC METABOLIC PANEL
BUN: 21 mg/dL (ref 6–23)
CO2: 25 mEq/L (ref 19–32)
Calcium: 9.1 mg/dL (ref 8.4–10.5)
Chloride: 105 mEq/L (ref 96–112)
Creatinine, Ser: 1.13 mg/dL (ref 0.40–1.50)
GFR: 65.42 mL/min (ref >60.00)
GLUCOSE: 84 mg/dL (ref 70–99)
POTASSIUM: 4.1 meq/L (ref 3.5–5.1)
Sodium: 138 mEq/L (ref 135–145)

## 2015-07-18 LAB — LIPID PANEL
CHOLESTEROL: 160 mg/dL (ref 0–200)
HDL: 51.5 mg/dL (ref >39.00)
LDL CALC: 97 mg/dL (ref 0–99)
NonHDL: 108.23
Total CHOL/HDL Ratio: 3
Triglycerides: 57 mg/dL (ref 0.0–149.0)
VLDL: 11.4 mg/dL (ref 0.0–40.0)

## 2015-07-18 NOTE — Progress Notes (Signed)
Quick Note:  Please report to patient. The recent labs are stable. Continue same medication and careful diet. ______ 

## 2015-08-01 DIAGNOSIS — D225 Melanocytic nevi of trunk: Secondary | ICD-10-CM | POA: Diagnosis not present

## 2015-08-01 DIAGNOSIS — L821 Other seborrheic keratosis: Secondary | ICD-10-CM | POA: Diagnosis not present

## 2015-08-01 DIAGNOSIS — D1801 Hemangioma of skin and subcutaneous tissue: Secondary | ICD-10-CM | POA: Diagnosis not present

## 2015-08-01 DIAGNOSIS — L570 Actinic keratosis: Secondary | ICD-10-CM | POA: Diagnosis not present

## 2015-09-25 DIAGNOSIS — H5203 Hypermetropia, bilateral: Secondary | ICD-10-CM | POA: Diagnosis not present

## 2015-11-11 ENCOUNTER — Other Ambulatory Visit: Payer: Self-pay | Admitting: Cardiology

## 2015-11-25 DIAGNOSIS — C61 Malignant neoplasm of prostate: Secondary | ICD-10-CM | POA: Diagnosis not present

## 2015-11-29 DIAGNOSIS — Z0000 Encounter for general adult medical examination without abnormal findings: Secondary | ICD-10-CM | POA: Diagnosis not present

## 2015-11-29 DIAGNOSIS — Z8546 Personal history of malignant neoplasm of prostate: Secondary | ICD-10-CM | POA: Diagnosis not present

## 2016-01-01 ENCOUNTER — Emergency Department (HOSPITAL_BASED_OUTPATIENT_CLINIC_OR_DEPARTMENT_OTHER)
Admission: EM | Admit: 2016-01-01 | Discharge: 2016-01-01 | Disposition: A | Discharge status: Home / Self Care | Payer: Medicare Other | Attending: Emergency Medicine | Admitting: Emergency Medicine

## 2016-01-01 ENCOUNTER — Encounter (HOSPITAL_BASED_OUTPATIENT_CLINIC_OR_DEPARTMENT_OTHER): Payer: Self-pay | Admitting: *Deleted

## 2016-01-01 ENCOUNTER — Emergency Department (HOSPITAL_BASED_OUTPATIENT_CLINIC_OR_DEPARTMENT_OTHER): Payer: Medicare Other

## 2016-01-01 DIAGNOSIS — Z87891 Personal history of nicotine dependence: Secondary | ICD-10-CM | POA: Insufficient documentation

## 2016-01-01 DIAGNOSIS — R509 Fever, unspecified: Secondary | ICD-10-CM

## 2016-01-01 DIAGNOSIS — R251 Tremor, unspecified: Secondary | ICD-10-CM | POA: Insufficient documentation

## 2016-01-01 DIAGNOSIS — Z7982 Long term (current) use of aspirin: Secondary | ICD-10-CM | POA: Diagnosis not present

## 2016-01-01 DIAGNOSIS — R0602 Shortness of breath: Secondary | ICD-10-CM | POA: Insufficient documentation

## 2016-01-01 LAB — CBC WITH DIFFERENTIAL/PLATELET
Basophils Absolute: 0.1 10*3/uL (ref 0.0–0.1)
Basophils Relative: 1 %
EOS ABS: 0.1 10*3/uL (ref 0.0–0.7)
EOS PCT: 1 %
HCT: 36.4 % — ABNORMAL LOW (ref 39.0–52.0)
Hemoglobin: 12.1 g/dL — ABNORMAL LOW (ref 13.0–17.0)
LYMPHS ABS: 0.4 10*3/uL — AB (ref 0.7–4.0)
LYMPHS PCT: 4 %
MCH: 29.9 pg (ref 26.0–34.0)
MCHC: 33.2 g/dL (ref 30.0–36.0)
MCV: 89.9 fL (ref 78.0–100.0)
MONOS PCT: 7 %
Monocytes Absolute: 0.8 10*3/uL (ref 0.1–1.0)
Neutro Abs: 9.7 10*3/uL — ABNORMAL HIGH (ref 1.7–7.7)
Neutrophils Relative %: 87 %
PLATELETS: 204 10*3/uL (ref 150–400)
RBC: 4.05 MIL/uL — ABNORMAL LOW (ref 4.22–5.81)
RDW: 14.8 % (ref 11.5–15.5)
WBC: 11 10*3/uL — ABNORMAL HIGH (ref 4.0–10.5)

## 2016-01-01 LAB — BASIC METABOLIC PANEL
Anion gap: 9 (ref 5–15)
BUN: 23 mg/dL — AB (ref 6–20)
CHLORIDE: 105 mmol/L (ref 101–111)
CO2: 22 mmol/L (ref 22–32)
CREATININE: 1.09 mg/dL (ref 0.61–1.24)
Calcium: 8.8 mg/dL — ABNORMAL LOW (ref 8.9–10.3)
GFR calc non Af Amer: 59 mL/min — ABNORMAL LOW (ref >60)
GLUCOSE: 147 mg/dL — AB (ref 65–99)
POTASSIUM: 3.9 mmol/L (ref 3.5–5.1)
SODIUM: 136 mmol/L (ref 135–145)

## 2016-01-01 LAB — URINALYSIS, ROUTINE W REFLEX MICROSCOPIC
BILIRUBIN URINE: NEGATIVE
GLUCOSE, UA: NEGATIVE mg/dL
HGB URINE DIPSTICK: NEGATIVE
Ketones, ur: 15 mg/dL — AB
Leukocytes, UA: NEGATIVE
Nitrite: NEGATIVE
Protein, ur: NEGATIVE mg/dL
SPECIFIC GRAVITY, URINE: 1.016 (ref 1.005–1.030)
pH: 5.5 (ref 5.0–8.0)

## 2016-01-01 MED ORDER — SODIUM CHLORIDE 0.9 % IV BOLUS (SEPSIS)
500.0000 mL | Freq: Once | INTRAVENOUS | Status: AC
  Administered 2016-01-01: 500 mL via INTRAVENOUS

## 2016-01-01 NOTE — ED Provider Notes (Signed)
CSN: [id number]     Arrival date & time 01/01/16  1837 History  By signing my name below, I, Dora Sims, attest that this documentation has been prepared under the direction and in the presence of physician practitioner, Sherwood Gambler, MD. Electronically Signed: Dora Sims, Scribe. 01/01/2016. 7:54 PM.   Chief Complaint  Patient presents with  . Fever    The history is provided by the patient. No language interpreter was used.     HPI Comments: Jonathan Graham is a 82 y.o. male with h/o abnormal cardiovascular stress test who presents to the Emergency Department complaining of sudden onset, constant, improving, fever beginning approximately 4 hours ago. Pt states that he was working in his yard and became "very hot"; he measured a fever of 102.4 at home; his temperature was measured at 99.7 in the ER. He endorses associated uncontrollable tremors and mild SOB which have resolved. Pt called his PCP and was advised to seek immediate medical attention. He states that he feels normal currently. Pt experienced neck stiffness at baseline which has not worsened since onset of his symptoms. He denies sore throat, headache, abdominal pain, vomiting, diarrhea, rashes, redness, dysuria, back pain, headache, cough, other pains, or any other associated symptoms.   Past Medical History  Diagnosis Date  . Hypercholesterolemia   . Atypical chest pain     history of   . Left bundle branch block   . History of mononucleosis   . Weakness     history of   . Cardiovascular stress test abnormal 04/25/2004    Abnormal adenosine Cardiolite study / evidence of inferoseptal ischemia &  an EF of 48% ""recommended that he undergo cardiac catheterization""  . Shortness of breath   . Fatigue     history of   . Cervical spine disease   . Spondylosis, cervical     at C3-C4  . Pleurisy 07/2005  . Dizziness     occasionally apon bending over  . Varicose veins   . Back pain   . Arthritis   . S/P radiation  therapy  12/05/2012 through 01/30/2013                                                          Prostate 7800 cGy 40 sessions, seminal vesicles 5600 cGy 40 sessions                        Past Surgical History  Procedure Laterality Date  . Cardiac catheterization  2005    EF estimated at 50% /  PROCEDURE:  Left heart catheterization, coronary and left ventricular angiography /  RAO view demonstrates mild LV enlargement  / mild hypokinesia anteroseptal with  overall low normal LV systolic function / Normal coronary anatomy  . Back surgery  1995     2 ruptured discs in lower back / by Dr. Louanne Skye  . Tonsillectomy and adenoidectomy    . Hemorrhoid surgery    . Prostate biopsy  08/25/2012    Gleason 3+3=6 PSA 7.80   Family History  Problem Relation Age of Onset  . Stroke Father   . Cancer Father   . Heart failure Mother   . Heart disease Mother   . Multiple sclerosis Sister   . Lung cancer Brother  Social History  Substance Use Topics  . Smoking status: Former Smoker -- 1.00 packs/day for 7 years    Types: Cigarettes    Quit date: 09/01/1951  . Smokeless tobacco: None  . Alcohol Use: No    Review of Systems  Constitutional: Positive for fever.  HENT: Negative for sore throat.   Respiratory: Positive for shortness of breath. Negative for cough.   Gastrointestinal: Negative for vomiting, abdominal pain and diarrhea.  Genitourinary: Negative for dysuria.  Musculoskeletal: Positive for neck stiffness (chronic). Negative for back pain and arthralgias.  Skin: Negative for rash.  Neurological: Positive for tremors. Negative for headaches.  All other systems reviewed and are negative.   Allergies  Lipitor  Home Medications   Prior to Admission medications   Medication Sig Start Date End Date Taking? Authorizing Provider  aspirin 325 MG tablet Take 325 mg by mouth every Monday, Wednesday, and Friday.     Historical Provider, MD  simvastatin (ZOCOR) 10 MG tablet TAKE 0.5 TABLETS  (5 MG TOTAL) BY MOUTH AT BEDTIME. 11/12/15   Darlin Coco, MD  tamsulosin (FLOMAX) 0.4 MG CAPS Take 1 capsule (0.4 mg total) by mouth daily. 11/10/12   Arloa Koh, MD   BP 103/67 mmHg  Pulse 103  Temp(Src) 99.7 F (37.6 C) (Oral)  Resp 16  Ht 5\' 11"  (1.803 m)  Wt 165 lb (74.844 kg)  BMI 23.02 kg/m2  SpO2 98% Physical Exam  Constitutional: He is oriented to person, place, and time. He appears well-developed and well-nourished.  HENT:  Head: Normocephalic and atraumatic.  Right Ear: External ear normal.  Left Ear: External ear normal.  Nose: Nose normal.  Mouth/Throat: Oropharynx is clear and moist.  Eyes: Right eye exhibits no discharge. Left eye exhibits no discharge.  Neck: Neck supple.  Cardiovascular: Normal rate, regular rhythm and intact distal pulses.   Murmur heard. Pulmonary/Chest: Effort normal and breath sounds normal.  Abdominal: Soft. There is no tenderness.  Musculoskeletal: He exhibits no edema.  Neurological: He is alert and oriented to person, place, and time.  Skin: Skin is warm and dry. No rash noted.  Nursing note and vitals reviewed.   ED Course  Procedures (including critical care time)  DIAGNOSTIC STUDIES: Oxygen Saturation is 98% on RA, normal by my interpretation.    COORDINATION OF CARE: 7:55 PM Discussed treatment plan with pt at bedside and pt agreed to plan.  Labs Review Labs Reviewed  BASIC METABOLIC PANEL - Abnormal; Notable for the following:    Glucose, Bld 147 (*)    BUN 23 (*)    Calcium 8.8 (*)    GFR calc non Af Amer 59 (*)    All other components within normal limits  CBC WITH DIFFERENTIAL/PLATELET - Abnormal; Notable for the following:    WBC 11.0 (*)    RBC 4.05 (*)    Hemoglobin 12.1 (*)    HCT 36.4 (*)    Neutro Abs 9.7 (*)    Lymphs Abs 0.4 (*)    All other components within normal limits  URINALYSIS, ROUTINE W REFLEX MICROSCOPIC (NOT AT [facility]) - Abnormal; Notable for the following:    Ketones, ur 15 (*)    All  other components within normal limits  URINE CULTURE    Imaging Review Dg Chest 2 View  01/01/2016  CLINICAL DATA:  Febrile. EXAM: CHEST  2 VIEW COMPARISON:  06/24/2014 FINDINGS: The lungs are clear. The pulmonary vasculature is normal. Heart size is normal. Hilar and mediastinal contours are  unremarkable. There is no pleural effusion. IMPRESSION: No active cardiopulmonary disease. Electronically Signed   By: Andreas Newport M.D.   On: 01/01/2016 21:19   I have personally reviewed and evaluated these images and lab results as part of my medical decision-making.   EKG Interpretation None      MDM   Final diagnoses:  Fever in adult    Patient presents with a sudden onset fever for only a few hours. No other symptoms. Family notes that he was temporarily short of breath but this was probably tachypnea related to the fever and increased metabolic demand. No signs of pneumonia and no cough. No UTI. No headache or concern for meningismus. I see no obvious bacterial infection. He did have his blood pressure drop in the 90s but most recent blood pressure was 106/77. His presentation does not seem consistent with sepsis. He appears quite well. He has a murmur but this is not new based on chart review. Doubt this is endocarditis. At this point it is unclear what his fever is from but given it is so acute I think time at home to wait and see whether symptoms appear or if symptoms were to worsen or improve is reasonable. Family agrees. Patient to follow-up closely with PCP and return here if symptoms worsen.  I personally performed the services described in this documentation, which was scribed in my presence. The recorded information has been reviewed and is accurate.   Sherwood Gambler, MD 01/01/16 [phone]

## 2016-01-01 NOTE — ED Notes (Addendum)
Pt c/o fever and chills x 1 day  PTA fever at 102.4 Asprin x 2 taken

## 2016-01-01 NOTE — ED Notes (Signed)
Onset around 4 pm while working in yard got hot and shaking x 1-2 hours  Had fever around 102.4  Took 500 mg asa  Denies pain  Called his md and was told to get medical attention

## 2016-01-02 ENCOUNTER — Telehealth: Payer: Self-pay | Admitting: *Deleted

## 2016-01-02 NOTE — Telephone Encounter (Signed)
Pt went to ED.  PLEASE NOTE: All timestamps contained within this report are represented as [place] Standard Time. CONFIDENTIALTY NOTICE: This fax transmission is intended only for the addressee. It contains information that is legally privileged, confidential or otherwise protected from use or disclosure. If you are not the intended recipient, you are strictly prohibited from reviewing, disclosing, copying using or disseminating any of this information or taking any action in reliance on or regarding this information. If you have received this fax in error, please notify us immediately by telephone so that we can arrange for its return to [place]. Phone: [phone], Toll-Free: [phone], Fax: [phone] Page: 1 of 2 Call Id: [id number] [facility] - Client [place] Patient Name: Jonathan Graham Gender: Male DOB: Mar 23, 1928 Age: 82 Y 3 M 29 D Return Phone Number: [phone] (Primary), [phone] (Secondary) Address: City/State/Zip: [place] Client [facility] - Client Client Site [place] - Night Physician Jonathan Graham - MD Contact Type Call Who Is Calling Patient / Member / Family / Caregiver Call Type Triage / Clinical Caller Name Jonathan Graham Relationship To Patient Spouse Return Phone Number [phone] (Primary) Chief Complaint Blood Pressure Low Reason for Call Symptomatic / Request for [place] states Jonathan, Graham, who is 82 YO started getting shakes. Went out and walked in sun. Came back in and shaking very bad, called wife home. She took his VS: T 102.1, BP 121/51, BS 84. PreDisposition Did not know what to do Translation No Nurse Assessment Nurse: Jonathan Ehrich, RN, Jonathan Graham Date/Time (Eastern Time): 01/01/2016 5:28:04 PM Confirm and document reason for call. If symptomatic, describe symptoms. You must click  the next button to save text entered. ---PT has shivering this afternoon and went outside into sunshine and felt better and then he came back in and was shivering and fever 102.1 oral. 121/51 on his BP and blood sugar 84. He is not diabetic. No other signs of illness Has the patient traveled out of the country within the last 30 days? ---No Does the patient have any new or worsening symptoms? ---Yes Will a triage be completed? ---Yes Related visit to physician within the last 2 weeks? ---No Does the PT have any chronic conditions? (i.e. diabetes, asthma, etc.) ---No Is this a behavioral health or substance abuse call? ---No Guidelines Guideline Title Affirmed Question Affirmed Notes Nurse Date/Time (Eastern Time) Fever [1] Fever > 101 F (38.3 C) AND [2] age > 55 Gaddy, RN, Jonathan Graham [id number] PM Disp. Time Jonathan Graham Time) Disposition Final User PLEASE NOTE: All timestamps contained within this report are represented as [place] Standard Time. CONFIDENTIALTY NOTICE: This fax transmission is intended only for the addressee. It contains information that is legally privileged, confidential or otherwise protected from use or disclosure. If you are not the intended recipient, you are strictly prohibited from reviewing, disclosing, copying using or disseminating any of this information or taking any action in reliance on or regarding this information. If you have received this fax in error, please notify us immediately by telephone so that we can arrange for its return to [place]. Phone: [phone], Toll-Free: [phone], Fax: [phone] Page: 2 of 2 Call Id: [id number] 01/01/2016 5:42:25 PM See Physician within 4 Hours (or PCP triage) Yes Jonathan Ehrich, RN, Jonathan Graham Understands: Yes Disagree/Comply: Comply Care Advice Given Per Guideline SEE PHYSICIAN WITHIN 4 HOURS (or PCP triage): * Treat fevers above 101 F (38.3 C). * The goal of  fever therapy is to bring the fever down to a  comfortable level. Remember that fever medicine usually lowers fever 2-3 F (1-1.5 C). * You become worse. * Take 650 mg (two 325 mg pills) by mouth every 4-6 hours as needed. Each Regular Strength Tylenol pill has 325 mg of acetaminophen. The most you should take each day is 3,250 mg (10 Regular Strength pills a day). * Another choice is to take 1,000 mg (two 500 mg pills) every 8 hours as needed. Each Extra Strength Tylenol pill has 500 mg of acetaminophen. The most you should take each day is 3,000 mg (6 Extra Strength pills a day). Comments User: Jonathan Calamity, RN Date/Time Jonathan Graham Time): 01/01/2016 5:32:19 PM h/o heart issue User: Jonathan Calamity, RN Date/Time Jonathan Graham Time): 01/01/2016 5:32:32 PM no chest pain and no trouble breathing User: Jonathan Calamity, RN Date/Time Jonathan Graham Time): 01/01/2016 5:33:07 PM wife cant name heart issue Referrals [facility]

## 2016-01-03 LAB — URINE CULTURE

## 2016-01-06 ENCOUNTER — Ambulatory Visit (INDEPENDENT_AMBULATORY_CARE_PROVIDER_SITE_OTHER): Payer: Medicare Other | Admitting: Family Medicine

## 2016-01-06 ENCOUNTER — Encounter: Payer: Self-pay | Admitting: Family Medicine

## 2016-01-06 VITALS — BP 100/70 | HR 86 | Temp 97.7°F | Ht 71.0 in | Wt 169.0 lb

## 2016-01-06 DIAGNOSIS — D72829 Elevated white blood cell count, unspecified: Secondary | ICD-10-CM | POA: Diagnosis not present

## 2016-01-06 DIAGNOSIS — R509 Fever, unspecified: Secondary | ICD-10-CM

## 2016-01-06 LAB — CBC WITH DIFFERENTIAL/PLATELET
BASOS PCT: 1 % (ref 0.0–3.0)
Basophils Absolute: 0.1 10*3/uL (ref 0.0–0.1)
EOS ABS: 0.3 10*3/uL (ref 0.0–0.7)
Eosinophils Relative: 5.4 % — ABNORMAL HIGH (ref 0.0–5.0)
HEMATOCRIT: 35.9 % — AB (ref 39.0–52.0)
HEMOGLOBIN: 12.1 g/dL — AB (ref 13.0–17.0)
LYMPHS PCT: 19.9 % (ref 12.0–46.0)
Lymphs Abs: 1.2 10*3/uL (ref 0.7–4.0)
MCHC: 33.5 g/dL (ref 30.0–36.0)
MCV: 87.9 fl (ref 78.0–100.0)
MONO ABS: 0.5 10*3/uL (ref 0.1–1.0)
Monocytes Relative: 9.3 % (ref 3.0–12.0)
Neutro Abs: 3.8 10*3/uL (ref 1.4–7.7)
Neutrophils Relative %: 64.4 % (ref 43.0–77.0)
Platelets: 249 10*3/uL (ref 150.0–400.0)
RBC: 4.09 Mil/uL — AB (ref 4.22–5.81)
RDW: 15.2 % (ref 11.5–15.5)
WBC: 5.8 10*3/uL (ref 4.0–10.5)

## 2016-01-06 NOTE — Progress Notes (Signed)
Pre visit review using our clinic review tool, if applicable. No additional management support is needed unless otherwise documented below in the visit note. 

## 2016-01-06 NOTE — Progress Notes (Signed)
Subjective:    Patient ID: Jonathan Graham, male    DOB: 1928/05/11, 82 y.o.   MRN: [id number]  HPI  patient seen for ER follow-up. He was evaluated there on 01/01/16  Patient relates earlier that day he was out working in the yard and had sudden onset of fever and chills with temperature taken at home 102.4. He recalls having some tremors and mild shortness of breath. On presentation to ER temperature 99.7. He denied any headaches, sore throat, abdominal pain, vomiting, diarrhea, skin rash , cough , or dysuria. No recent tick bites.   Patient relates he's had at least a couple other episodes similarly in the past with very transient fever with no clear source. Evaluation in ER was significant for normal chest x-ray and normal urinalysis. White blood count 11,000. Chemistries normal. Non-focal exam. His symptoms lasted only a few hours and he felt much better prior to discharge. He does have history of aortic valve stenosis but denies any recent progressive shortness of breath or prolonged fevers or night sweats. Appetite has been stable.  Past Medical History  Diagnosis Date  . Hypercholesterolemia   . Atypical chest pain     history of   . Left bundle branch block   . History of mononucleosis   . Weakness     history of   . Cardiovascular stress test abnormal 04/25/2004    Abnormal adenosine Cardiolite study / evidence of inferoseptal ischemia &  an EF of 48% ""recommended that he undergo cardiac catheterization""  . Shortness of breath   . Fatigue     history of   . Cervical spine disease   . Spondylosis, cervical     at C3-C4  . Pleurisy 07/2005  . Dizziness     occasionally apon bending over  . Varicose veins   . Back pain   . Arthritis   . S/P radiation therapy  12/05/2012 through 01/30/2013                                                          Prostate 7800 cGy 40 sessions, seminal vesicles 5600 cGy 40 sessions                        Past Surgical History  Procedure  Laterality Date  . Cardiac catheterization  2005    EF estimated at 50% /  PROCEDURE:  Left heart catheterization, coronary and left ventricular angiography /  RAO view demonstrates mild LV enlargement  / mild hypokinesia anteroseptal with  overall low normal LV systolic function / Normal coronary anatomy  . Back surgery  1995     2 ruptured discs in lower back / by Dr. Louanne Skye  . Tonsillectomy and adenoidectomy    . Hemorrhoid surgery    . Prostate biopsy  08/25/2012    Gleason 3+3=6 PSA 7.80    reports that he quit smoking about 64 years ago. His smoking use included Cigarettes. He has a 7 pack-year smoking history. He does not have any smokeless tobacco history on file. He reports that he does not drink alcohol or use illicit drugs. family history includes Cancer in his father; Heart disease in his mother; Heart failure in his mother; Lung cancer in his brother; Multiple sclerosis in his sister;  Stroke in his father. Allergies  Allergen Reactions  . Lipitor [Atorvastatin Calcium]     cramps      Review of Systems  Constitutional: Negative for appetite change and fatigue. Fever:  see history of present illness.  HENT: Negative for congestion and sore throat.   Respiratory: Negative for cough, shortness of breath and wheezing.   Cardiovascular: Negative for chest pain.  Gastrointestinal: Negative for nausea, vomiting, abdominal pain and diarrhea.  Genitourinary: Negative for dysuria.  Musculoskeletal: Negative for myalgias and arthralgias.  Skin: Negative for rash.  Neurological: Negative for headaches.  Hematological: Negative for adenopathy.       Objective:   Physical Exam  Constitutional: He is oriented to person, place, and time. He appears well-developed and well-nourished.  HENT:  Right Ear: External ear normal.  Left Ear: External ear normal.  Mouth/Throat: Oropharynx is clear and moist.  Neck: Neck supple. No thyromegaly present.  Cardiovascular: Normal rate and  regular rhythm.   He has murmur over aortic area consistent with his aortic stenosis  Pulmonary/Chest: Effort normal and breath sounds normal. No respiratory distress. He has no wheezes. He has no rales.  Abdominal: Soft. Bowel sounds are normal. He exhibits no distension and no mass. There is no tenderness. There is no rebound and no guarding.  Musculoskeletal: He exhibits no edema.  Neurological: He is alert and oriented to person, place, and time. No cranial nerve deficit.  Skin: No rash noted.          Assessment & Plan:  Transient fever as above with unrevealing workup. He had a least a couple of other similar occasions previously. No evidence for sepsis. His only finding was minimally elevated white blood cell count of 11,000. We explained that fever sometimes can represent things other than infection such as rheumatologic disease or malignancy but as symptoms were very transient and no focal signs or symptoms can be very challenging to evaluate. He's had no fever whatsoever since episode above. We recommend observation this point. If recurs consider further evaluation possibly CT abdomen and pelvis  [name] MD [facility]

## 2016-01-06 NOTE — Patient Instructions (Signed)
Follow up promptly for any fever or new symptoms Stay well hydrated.

## 2016-03-30 NOTE — Progress Notes (Signed)
Cardiology Office Note   Date:  03/31/2016   ID:  PACE POITIER, DOB 23-Aug-1928, MRN [id number]  PCP:  [name], MD  Cardiologist: Peter [name] MD  Chief Complaint  Patient presents with  . Follow-up    6 months  former Dr. Mare Ferrari pt  pt wife states he gets occasional high fevers, goes away quickly and not sure why it happens; no other Sx.  . Varicose Veins      History of Present Illness: Jonathan Graham is a 82 y.o. male who presents for follow up Aortic stenosis, LBBB, and HLD. He is a former patient of Dr. Mare Ferrari.    He does have a history of known left bundle branch block and a history of hypercholesterolemia. He has a past history of syncope but has had no recent syncopal episodes. He has a history of prostate CA treated with radiation therapy. The last level was 0.6 The patient has a history of moderate aortic stenosis.  He has known left bundle branch block. Since last visit he has had no new cardiac symptoms. He stays physically active and enjoys working in his yard. His left knee limits his walking. He does have prominent varicose veins but no pain or inflammation. He has intermittent high fevers that resolve quickly. So far no documented infection identified.    Past Medical History:  Diagnosis Date  . Arthritis   . Atypical chest pain    history of   . Back pain   . Cardiovascular stress test abnormal 04/25/2004   Abnormal adenosine Cardiolite study / evidence of inferoseptal ischemia &  an EF of 48% ""recommended that he undergo cardiac catheterization""  . Cervical spine disease   . Dizziness    occasionally apon bending over  . Fatigue    history of   . History of mononucleosis   . Hypercholesterolemia   . Left bundle branch block   . Pleurisy 07/2005  . S/P radiation therapy  12/05/2012 through 01/30/2013                                                         Prostate 7800 cGy 40 sessions, seminal vesicles 5600 cGy 40 sessions                        . Shortness of breath   . Spondylosis, cervical    at C3-C4  . Varicose veins   . Weakness    history of     Past Surgical History:  Procedure Laterality Date  . BACK SURGERY  1995    2 ruptured discs in lower back / by Dr. Louanne Skye  . CARDIAC CATHETERIZATION  2005   EF estimated at 50% /  PROCEDURE:  Left heart catheterization, coronary and left ventricular angiography /  RAO view demonstrates mild LV enlargement  / mild hypokinesia anteroseptal with  overall low normal LV systolic function / Normal coronary anatomy  . HEMORRHOID SURGERY    . PROSTATE BIOPSY  08/25/2012   Gleason 3+3=6 PSA 7.80  . TONSILLECTOMY AND ADENOIDECTOMY       Current Outpatient Prescriptions  Medication Sig Dispense Refill  . simvastatin (ZOCOR) 10 MG tablet TAKE 0.5 TABLETS (5 MG TOTAL) BY MOUTH AT BEDTIME. 15 tablet 6  . tamsulosin (FLOMAX)  0.4 MG CAPS Take 1 capsule (0.4 mg total) by mouth daily. 30 capsule 6   Current Facility-Administered Medications  Medication Dose Route Frequency Provider Last Rate Last Dose  . aspirin chewable tablet 81 mg  81 mg Oral Once [name] [name], MD        Allergies:   Lipitor [atorvastatin calcium]    Social History:  The patient  reports that he quit smoking about 64 years ago. His smoking use included Cigarettes. He has a 7.00 pack-year smoking history. He does not have any smokeless tobacco history on file. He reports that he does not drink alcohol or use drugs.   Family History:  The patient's family history includes Cancer in his father; Heart disease in his mother; Heart failure in his mother; Lung cancer in his brother; Multiple sclerosis in his sister; Stroke in his father.    ROS:  Please see the history of present illness.   Otherwise, review of systems are positive for none.   All other systems are reviewed and negative.    PHYSICAL EXAM: VS:  BP 120/70 (BP Location: Right Arm, Patient Position: Sitting, Cuff Size: Normal)   Pulse 74    Ht 6' (1.829 m)   Wt 166 lb (75.3 kg)   BMI 22.51 kg/m  , BMI Body mass index is 22.51 kg/m. GEN: Well nourished, well developed, in no acute distress  HEENT: normal  Neck: no JVD, carotid bruits, or masses Cardiac: RRR;  There is a soft systolic ejection murmur at the base. There is no, rubs, or gallops,no edema  Respiratory:  clear to auscultation bilaterally, normal work of breathing GI: soft, nontender, nondistended, + BS MS: no deformity or atrophy  Skin: warm and dry, no rash Neuro:  Strength and sensation are intact Psych: euthymic mood, full affect   EKG:  EKG is not ordered today.    Recent Labs: 07/18/2015: ALT 10 01/01/2016: BUN 23; Creatinine, Ser 1.09; Potassium 3.9; Sodium 136 01/06/2016: Hemoglobin 12.1; Platelets 249.0    Lipid Panel    Component Value Date/Time   CHOL 160 07/18/2015 0811   TRIG 57.0 07/18/2015 0811   HDL 51.50 07/18/2015 0811   CHOLHDL 3 07/18/2015 0811   VLDL 11.4 07/18/2015 0811   LDLCALC 97 07/18/2015 0811      Wt Readings from Last 3 Encounters:  03/31/16 166 lb (75.3 kg)  01/06/16 169 lb (76.7 kg)  01/01/16 165 lb (74.8 kg)     Echo: 11/02/14:Study Conclusions  - Left ventricle: The cavity size was normal. There was mild focal basal hypertrophy of the septum. Systolic function was mildly to moderately reduced. The estimated ejection fraction was in the range of 40% to 45%. Diffuse hypokinesis and paradoxical septal motion consistent with LBBB. Doppler parameters are consistent with abnormal left ventricular relaxation (grade 1 diastolic dysfunction). - Ventricular septum: Septal motion showed paradox. - Aortic valve: Valve mobility was restricted. There was moderate stenosis. There was trivial regurgitation. Mean gradient (S): 24 mm Hg. - Mitral valve: Calcified annulus. Mildly thickened leaflets . There was mild regurgitation. - Left atrium: The atrium was mildly dilated. - Right ventricle: Systolic  function was normal. - Right atrium: The atrium was normal in size. - Tricuspid valve: There was mild regurgitation. - Pulmonic valve: There was mild regurgitation. - Pulmonary arteries: Systolic pressure was within the normal range. PA peak pressure: 32 mm Hg (S). - Inferior vena cava: The vessel was normal in size. - Pericardium, extracardiac: There was no pericardial effusion.  Impressions:  - Compared to the prior study from 2013 the aortic stenosis is now moderate.   ASSESSMENT AND PLAN:  1. Left bundle-branch block 2. Hypercholesterolemia- will check fasting lab work today on Zocor.  3. Prostate cancer treated with radiation therapy 4. Moderate aortic stenosis- asymptomatic. Repeat Echo next year.   Current medicines are reviewed at length with the patient today.  The patient does not have concerns regarding medicines.  The following changes have been made: reduce ASA to 81 mg daily.  Labs/ tests ordered today include:   Orders Placed This Encounter  Procedures  . Basic metabolic panel  . Lipid panel  . Hepatic function panel    Disposition: Continue current meds. Follow up in 6 months.   Signed, Peter [name] MD, [facility]   03/31/2016 8:54 AM    Regino Ramirez

## 2016-03-31 ENCOUNTER — Other Ambulatory Visit: Payer: Self-pay

## 2016-03-31 ENCOUNTER — Encounter: Payer: Self-pay | Admitting: Cardiology

## 2016-03-31 ENCOUNTER — Ambulatory Visit (INDEPENDENT_AMBULATORY_CARE_PROVIDER_SITE_OTHER): Payer: Medicare Other | Admitting: Cardiology

## 2016-03-31 VITALS — BP 120/70 | HR 74 | Ht 72.0 in | Wt 166.0 lb

## 2016-03-31 DIAGNOSIS — I350 Nonrheumatic aortic (valve) stenosis: Secondary | ICD-10-CM

## 2016-03-31 DIAGNOSIS — I8390 Asymptomatic varicose veins of unspecified lower extremity: Secondary | ICD-10-CM

## 2016-03-31 DIAGNOSIS — I8393 Asymptomatic varicose veins of bilateral lower extremities: Secondary | ICD-10-CM | POA: Diagnosis not present

## 2016-03-31 DIAGNOSIS — I447 Left bundle-branch block, unspecified: Secondary | ICD-10-CM

## 2016-03-31 DIAGNOSIS — E7800 Pure hypercholesterolemia, unspecified: Secondary | ICD-10-CM

## 2016-03-31 LAB — BASIC METABOLIC PANEL
BUN: 22 mg/dL (ref 7–25)
CHLORIDE: 103 mmol/L (ref 98–110)
CO2: 24 mmol/L (ref 20–31)
Calcium: 9 mg/dL (ref 8.6–10.3)
Creat: 1.15 mg/dL — ABNORMAL HIGH (ref 0.70–1.11)
Glucose, Bld: 82 mg/dL (ref 65–99)
POTASSIUM: 4.9 mmol/L (ref 3.5–5.3)
SODIUM: 137 mmol/L (ref 135–146)

## 2016-03-31 LAB — LIPID PANEL
CHOL/HDL RATIO: 2.4 ratio (ref <=5.0)
CHOLESTEROL: 158 mg/dL (ref 125–200)
HDL: 67 mg/dL (ref >=40)
LDL CALC: 81 mg/dL (ref <130)
TRIGLYCERIDES: 49 mg/dL (ref <150)
VLDL: 10 mg/dL (ref <30)

## 2016-03-31 LAB — HEPATIC FUNCTION PANEL
ALBUMIN: 4.2 g/dL (ref 3.6–5.1)
ALT: 9 U/L (ref 9–46)
AST: 17 U/L (ref 10–35)
Alkaline Phosphatase: 66 U/L (ref 40–115)
Bilirubin, Direct: 0.2 mg/dL (ref <=0.2)
Indirect Bilirubin: 0.7 mg/dL (ref 0.2–1.2)
TOTAL PROTEIN: 6.4 g/dL (ref 6.1–8.1)
Total Bilirubin: 0.9 mg/dL (ref 0.2–1.2)

## 2016-03-31 MED ORDER — ASPIRIN 81 MG PO CHEW: 81.0000 mg | CHEWABLE_TABLET | Freq: Once | ORAL | Status: DC

## 2016-03-31 NOTE — Patient Instructions (Signed)
We will  Check fasting lab work today  Reduce aspirin to 81 mg daily  I will see you in 6 months.

## 2016-04-17 DIAGNOSIS — M25561 Pain in right knee: Secondary | ICD-10-CM | POA: Diagnosis not present

## 2016-04-17 DIAGNOSIS — M1711 Unilateral primary osteoarthritis, right knee: Secondary | ICD-10-CM | POA: Diagnosis not present

## 2016-04-28 ENCOUNTER — Encounter: Payer: Self-pay | Admitting: Family Medicine

## 2016-04-28 ENCOUNTER — Ambulatory Visit (INDEPENDENT_AMBULATORY_CARE_PROVIDER_SITE_OTHER): Payer: Medicare Other | Admitting: Family Medicine

## 2016-04-28 VITALS — BP 100/70 | HR 77 | Ht 72.0 in

## 2016-04-28 DIAGNOSIS — Z0000 Encounter for general adult medical examination without abnormal findings: Secondary | ICD-10-CM | POA: Diagnosis not present

## 2016-04-28 DIAGNOSIS — Z23 Encounter for immunization: Secondary | ICD-10-CM

## 2016-04-28 NOTE — Progress Notes (Signed)
Pre visit review using our clinic review tool, if applicable. No additional management support is needed unless otherwise documented below in the visit note. 

## 2016-04-28 NOTE — Progress Notes (Signed)
Subjective:     Patient ID: Jonathan Graham, male   DOB: Apr 25, 1928, 82 y.o.   MRN: [id number]  HPI Patient here for physical exam. His chronic problems include history of aortic valve sclerosis with moderate stenosis, osteoarthritis, remote history of prostate cancer, hyperlipidemia, and BPH. He is followed by urology regarding his prostate cancer history and still gets PSAs twice yearly. Last tetanus over 10 years ago. No documentation prior Prevnar 13. Plans to get flu vaccine later this year. His only current medications are Flomax and simvastatin. No recent falls. No depression issues.  Past Medical History:  Diagnosis Date  . Arthritis   . Atypical chest pain    history of   . Back pain   . Cardiovascular stress test abnormal 04/25/2004   Abnormal adenosine Cardiolite study / evidence of inferoseptal ischemia &  an EF of 48% ""recommended that he undergo cardiac catheterization""  . Cervical spine disease   . Dizziness    occasionally apon bending over  . Fatigue    history of   . History of mononucleosis   . Hypercholesterolemia   . Left bundle branch block   . Pleurisy 07/2005  . S/P radiation therapy  12/05/2012 through 01/30/2013                                                         Prostate 7800 cGy 40 sessions, seminal vesicles 5600 cGy 40 sessions                       . Shortness of breath   . Spondylosis, cervical    at C3-C4  . Varicose veins   . Weakness    history of    Past Surgical History:  Procedure Laterality Date  . BACK SURGERY  1995    2 ruptured discs in lower back / by Dr. Louanne Skye  . CARDIAC CATHETERIZATION  2005   EF estimated at 50% /  PROCEDURE:  Left heart catheterization, coronary and left ventricular angiography /  RAO view demonstrates mild LV enlargement  / mild hypokinesia anteroseptal with  overall low normal LV systolic function / Normal coronary anatomy  . HEMORRHOID SURGERY    . PROSTATE BIOPSY  08/25/2012   Gleason 3+3=6 PSA 7.80  .  TONSILLECTOMY AND ADENOIDECTOMY      reports that he quit smoking about 64 years ago. His smoking use included Cigarettes. He has a 7.00 pack-year smoking history. He has never used smokeless tobacco. He reports that he does not drink alcohol or use drugs. family history includes Cancer in his father; Heart disease in his mother; Heart failure in his mother; Lung cancer in his brother; Multiple sclerosis in his sister; Stroke in his father. Allergies  Allergen Reactions  . Lipitor [Atorvastatin Calcium]     cramps     Review of Systems  Constitutional: Negative for activity change, appetite change, fatigue and fever.  HENT: Negative for congestion, ear pain and trouble swallowing.   Eyes: Negative for pain and visual disturbance.  Respiratory: Negative for cough, shortness of breath and wheezing.   Cardiovascular: Negative for chest pain and palpitations.  Gastrointestinal: Negative for abdominal distention, abdominal pain, blood in stool, constipation, diarrhea, nausea, rectal pain and vomiting.  Endocrine: Negative for polydipsia and polyuria.  Genitourinary: Negative  for dysuria, hematuria and testicular pain.  Musculoskeletal: Positive for arthralgias. Negative for joint swelling.  Skin: Negative for rash.  Neurological: Negative for dizziness, syncope and headaches.  Hematological: Negative for adenopathy.  Psychiatric/Behavioral: Negative for confusion and dysphoric mood.       Objective:   Physical Exam  Constitutional: He is oriented to person, place, and time. He appears well-developed and well-nourished.  HENT:  Right Ear: External ear normal.  Left Ear: External ear normal.  Mouth/Throat: Oropharynx is clear and moist.  Neck: Neck supple. No thyromegaly present.  Cardiovascular: Normal rate and regular rhythm.   Murmur heard. Pulmonary/Chest: Effort normal and breath sounds normal. No respiratory distress. He has no wheezes. He has no rales.  Abdominal: Soft. Bowel  sounds are normal. He exhibits no distension and no mass. There is no tenderness. There is no rebound and no guarding.  Musculoskeletal: He exhibits no edema.  Neurological: He is alert and oriented to person, place, and time. No cranial nerve deficit.  Skin: No rash noted.       Assessment:     #1 physical exam.  Patient needs Prevnar 13 and tetanus booster #2 history of prostate cancer. Followed by urology #3 hyperlipidemia. Recent labs per cardiology reviewed with patient    Plan:     -Tetanus booster and Prevnar 13 given -Recommendation for flu vaccine later this fall -Discussed fall risk prevention -Continue follow-up with cardiology and urology  [name] MD [facility]

## 2016-04-28 NOTE — Patient Instructions (Signed)
Remember flu vaccine this Fall. We gave Prevnar 13 and Tdap today.

## 2016-06-01 DIAGNOSIS — N3281 Overactive bladder: Secondary | ICD-10-CM | POA: Diagnosis not present

## 2016-06-01 DIAGNOSIS — R3912 Poor urinary stream: Secondary | ICD-10-CM | POA: Diagnosis not present

## 2016-06-01 DIAGNOSIS — N401 Enlarged prostate with lower urinary tract symptoms: Secondary | ICD-10-CM | POA: Diagnosis not present

## 2016-06-15 ENCOUNTER — Other Ambulatory Visit: Payer: Self-pay | Admitting: *Deleted

## 2016-06-15 MED ORDER — SIMVASTATIN 10 MG PO TABS: ORAL_TABLET | ORAL | 6 refills | Status: DC

## 2016-06-17 ENCOUNTER — Ambulatory Visit (INDEPENDENT_AMBULATORY_CARE_PROVIDER_SITE_OTHER): Payer: Medicare Other

## 2016-06-17 DIAGNOSIS — Z23 Encounter for immunization: Secondary | ICD-10-CM | POA: Diagnosis not present

## 2016-08-14 DIAGNOSIS — M1711 Unilateral primary osteoarthritis, right knee: Secondary | ICD-10-CM | POA: Diagnosis not present

## 2016-09-02 ENCOUNTER — Other Ambulatory Visit: Payer: Self-pay

## 2016-09-02 MED ORDER — SIMVASTATIN 10 MG PO TABS: ORAL_TABLET | ORAL | 2 refills | Status: DC

## 2016-09-02 MED ORDER — SIMVASTATIN 10 MG PO TABS
ORAL_TABLET | ORAL | 2 refills | Status: DC
Start: 2016-09-02 — End: 2016-09-02

## 2016-09-03 ENCOUNTER — Other Ambulatory Visit: Payer: Self-pay

## 2016-09-03 MED ORDER — TAMSULOSIN HCL 0.4 MG PO CAPS: 0.4000 mg | ORAL_CAPSULE | Freq: Every day | ORAL | 3 refills | Status: DC

## 2016-09-08 DIAGNOSIS — H04123 Dry eye syndrome of bilateral lacrimal glands: Secondary | ICD-10-CM | POA: Diagnosis not present

## 2016-09-08 DIAGNOSIS — H5203 Hypermetropia, bilateral: Secondary | ICD-10-CM | POA: Diagnosis not present

## 2016-09-10 DIAGNOSIS — D225 Melanocytic nevi of trunk: Secondary | ICD-10-CM | POA: Diagnosis not present

## 2016-09-10 DIAGNOSIS — L814 Other melanin hyperpigmentation: Secondary | ICD-10-CM | POA: Diagnosis not present

## 2016-09-10 DIAGNOSIS — L821 Other seborrheic keratosis: Secondary | ICD-10-CM | POA: Diagnosis not present

## 2016-09-10 DIAGNOSIS — L570 Actinic keratosis: Secondary | ICD-10-CM | POA: Diagnosis not present

## 2016-09-24 DIAGNOSIS — I447 Left bundle-branch block, unspecified: Secondary | ICD-10-CM | POA: Diagnosis not present

## 2016-09-24 DIAGNOSIS — I350 Nonrheumatic aortic (valve) stenosis: Secondary | ICD-10-CM | POA: Diagnosis not present

## 2016-09-24 DIAGNOSIS — E7800 Pure hypercholesterolemia, unspecified: Secondary | ICD-10-CM | POA: Diagnosis not present

## 2016-09-24 LAB — BASIC METABOLIC PANEL
BUN: 17 mg/dL (ref 7–25)
CHLORIDE: 104 mmol/L (ref 98–110)
CO2: 26 mmol/L (ref 20–31)
CREATININE: 1 mg/dL (ref 0.70–1.11)
Calcium: 8.9 mg/dL (ref 8.6–10.3)
Glucose, Bld: 87 mg/dL (ref 65–99)
Potassium: 4.4 mmol/L (ref 3.5–5.3)
Sodium: 138 mmol/L (ref 135–146)

## 2016-09-24 LAB — LIPID PANEL
Cholesterol: 154 mg/dL (ref <200)
HDL: 58 mg/dL (ref >40)
LDL CALC: 85 mg/dL (ref <100)
TRIGLYCERIDES: 55 mg/dL (ref <150)
Total CHOL/HDL Ratio: 2.7 Ratio (ref <5.0)
VLDL: 11 mg/dL (ref <30)

## 2016-09-24 LAB — HEPATIC FUNCTION PANEL
ALK PHOS: 86 U/L (ref 40–115)
ALT: 9 U/L (ref 9–46)
AST: 18 U/L (ref 10–35)
Albumin: 4 g/dL (ref 3.6–5.1)
BILIRUBIN INDIRECT: 0.5 mg/dL (ref 0.2–1.2)
Bilirubin, Direct: 0.1 mg/dL (ref <=0.2)
TOTAL PROTEIN: 6.5 g/dL (ref 6.1–8.1)
Total Bilirubin: 0.6 mg/dL (ref 0.2–1.2)

## 2016-09-27 NOTE — Progress Notes (Signed)
Cardiology Office Note   Date:  09/28/2016   ID:  Jonathan Graham, DOB 04-20-1928, MRN [id number]  PCP:  [name], MD  Cardiologist: Peter [name] MD  Chief Complaint  Patient presents with  . Aortic Stenosis      History of Present Illness: Jonathan Graham is a 83 y.o. male who presents for follow up Aortic stenosis, LBBB, and HLD. He is a former patient of Dr. Mare Ferrari.    He does have a history of known left bundle branch block and a history of hypercholesterolemia. He has a past history of syncope but has had no recent syncopal episodes. He has a history of prostate CA treated with radiation therapy. The last level was 0.6 The patient has a history of moderate aortic stenosis.  He has known left bundle branch block.   Since his last visit he is doing OK. He occasionally gets lightheaded.  He stays physically active and enjoys working in his yard. His left knee limits his walking. He complains of pain in his lower legs more. He does have prominent varicose veins but no pain or inflammation. He does have to sit down and rest sometimes after walking up stairs now.    Past Medical History:  Diagnosis Date  . Arthritis   . Atypical chest pain    history of   . Back pain   . Cardiovascular stress test abnormal 04/25/2004   Abnormal adenosine Cardiolite study / evidence of inferoseptal ischemia &  an EF of 48% ""recommended that he undergo cardiac catheterization""  . Cervical spine disease   . Dizziness    occasionally apon bending over  . Fatigue    history of   . History of mononucleosis   . Hypercholesterolemia   . Left bundle branch block   . Pleurisy 07/2005  . S/P radiation therapy  12/05/2012 through 01/30/2013                                                         Prostate 7800 cGy 40 sessions, seminal vesicles 5600 cGy 40 sessions                       . Shortness of breath   . Spondylosis, cervical    at C3-C4  . Varicose veins   . Weakness    history of     Past Surgical History:  Procedure Laterality Date  . BACK SURGERY  1995    2 ruptured discs in lower back / by Dr. Louanne Skye  . CARDIAC CATHETERIZATION  2005   EF estimated at 50% /  PROCEDURE:  Left heart catheterization, coronary and left ventricular angiography /  RAO view demonstrates mild LV enlargement  / mild hypokinesia anteroseptal with  overall low normal LV systolic function / Normal coronary anatomy  . HEMORRHOID SURGERY    . PROSTATE BIOPSY  08/25/2012   Gleason 3+3=6 PSA 7.80  . TONSILLECTOMY AND ADENOIDECTOMY       Current Outpatient Prescriptions  Medication Sig Dispense Refill  . simvastatin (ZOCOR) 10 MG tablet TAKE 0.5 TABLETS (5 MG TOTAL) BY MOUTH AT BEDTIME. 45 tablet 2  . tamsulosin (FLOMAX) 0.4 MG CAPS capsule Take 1 capsule (0.4 mg total) by mouth daily. 90 capsule 3   Current Facility-Administered Medications  Medication Dose Route Frequency Provider Last Rate Last Dose  . aspirin chewable tablet 81 mg  81 mg Oral Once [name] [name], MD        Allergies:   Lipitor [atorvastatin calcium]    Social History:  The patient  reports that he quit smoking about 65 years ago. His smoking use included Cigarettes. He has a 7.00 pack-year smoking history. He has never used smokeless tobacco. He reports that he does not drink alcohol or use drugs.   Family History:  The patient's family history includes Cancer in his father; Heart disease in his mother; Heart failure in his mother; Lung cancer in his brother; Multiple sclerosis in his sister; Stroke in his father.    ROS:  Please see the history of present illness.   Otherwise, review of systems are positive for none.   All other systems are reviewed and negative.    PHYSICAL EXAM: VS:  BP 100/64   Pulse 63   Ht 5' 9.5" (1.765 m)   Wt 171 lb 3.2 oz (77.7 kg)   BMI 24.92 kg/m  , BMI Body mass index is 24.92 kg/m. GEN: Well nourished, well developed, in no acute distress  HEENT: normal  Neck: no  JVD, carotid bruits, or masses Cardiac: RRR;  There is a soft systolic ejection murmur at the base. There is no, rubs, or gallops,no edema  Respiratory:  clear to auscultation bilaterally, normal work of breathing GI: soft, nontender, nondistended, + BS MS: no deformity or atrophy  Skin: warm and dry, no rash Neuro:  Strength and sensation are intact Psych: euthymic mood, full affect   EKG:  EKG is  ordered today. It shows NSR with rate 63. LBBB. No acute change. I have personally reviewed and interpreted this study.   Recent Labs: 01/06/2016: Hemoglobin 12.1; Platelets 249.0 09/24/2016: ALT 9; BUN 17; Creat 1.00; Potassium 4.4; Sodium 138    Lipid Panel    Component Value Date/Time   CHOL 154 09/24/2016 0803   TRIG 55 09/24/2016 0803   HDL 58 09/24/2016 0803   CHOLHDL 2.7 09/24/2016 0803   VLDL 11 09/24/2016 0803   LDLCALC 85 09/24/2016 0803      Wt Readings from Last 3 Encounters:  09/28/16 171 lb 3.2 oz (77.7 kg)  03/31/16 166 lb (75.3 kg)  01/06/16 169 lb (76.7 kg)     Echo: 11/02/14:Study Conclusions  - Left ventricle: The cavity size was normal. There was mild focal basal hypertrophy of the septum. Systolic function was mildly to moderately reduced. The estimated ejection fraction was in the range of 40% to 45%. Diffuse hypokinesis and paradoxical septal motion consistent with LBBB. Doppler parameters are consistent with abnormal left ventricular relaxation (grade 1 diastolic dysfunction). - Ventricular septum: Septal motion showed paradox. - Aortic valve: Valve mobility was restricted. There was moderate stenosis. There was trivial regurgitation. Mean gradient (S): 24 mm Hg. - Mitral valve: Calcified annulus. Mildly thickened leaflets . There was mild regurgitation. - Left atrium: The atrium was mildly dilated. - Right ventricle: Systolic function was normal. - Right atrium: The atrium was normal in size. - Tricuspid valve: There was mild  regurgitation. - Pulmonic valve: There was mild regurgitation. - Pulmonary arteries: Systolic pressure was within the normal range. PA peak pressure: 32 mm Hg (S). - Inferior vena cava: The vessel was normal in size. - Pericardium, extracardiac: There was no pericardial effusion.  Impressions:  - Compared to the prior study from 2013 the aortic stenosis is  now moderate.   ASSESSMENT AND PLAN:  1. Left bundle-branch block- chronic 2. Hypercholesterolemia- well controlled. 3. Prostate cancer treated with radiation therapy 4. Moderate aortic stenosis- ? Developing some symptoms with dyspnea walking up stairs and some lightheadedness. Will update Echo.   Current medicines are reviewed at length with the patient today.  The patient does not have concerns regarding medicines.  The following changes have been made: none  Labs/ tests ordered today include:   Orders Placed This Encounter  Procedures  . Basic metabolic panel  . Lipid panel  . Hepatic function panel  . EKG 12-Lead  . ECHOCARDIOGRAM COMPLETE    Disposition: Follow up in 6 months.   Signed, Peter [name] MD, [facility]   09/28/2016 5:54 PM    [place]

## 2016-09-28 ENCOUNTER — Ambulatory Visit (INDEPENDENT_AMBULATORY_CARE_PROVIDER_SITE_OTHER): Payer: Medicare Other | Admitting: Cardiology

## 2016-09-28 ENCOUNTER — Encounter: Payer: Self-pay | Admitting: Cardiology

## 2016-09-28 VITALS — BP 100/64 | HR 63 | Ht 69.5 in | Wt 171.2 lb

## 2016-09-28 DIAGNOSIS — I350 Nonrheumatic aortic (valve) stenosis: Secondary | ICD-10-CM | POA: Diagnosis not present

## 2016-09-28 DIAGNOSIS — I447 Left bundle-branch block, unspecified: Secondary | ICD-10-CM | POA: Diagnosis not present

## 2016-09-28 DIAGNOSIS — E7800 Pure hypercholesterolemia, unspecified: Secondary | ICD-10-CM

## 2016-09-28 NOTE — Patient Instructions (Signed)
Continue your current therapy  We will check an Echocardiogram  I will see you in 6 months

## 2016-10-15 ENCOUNTER — Ambulatory Visit (HOSPITAL_COMMUNITY): Payer: Medicare Other | Attending: Cardiovascular Disease

## 2016-10-15 ENCOUNTER — Other Ambulatory Visit: Payer: Self-pay

## 2016-10-15 DIAGNOSIS — E7800 Pure hypercholesterolemia, unspecified: Secondary | ICD-10-CM | POA: Diagnosis not present

## 2016-10-15 DIAGNOSIS — I447 Left bundle-branch block, unspecified: Secondary | ICD-10-CM | POA: Diagnosis not present

## 2016-10-15 DIAGNOSIS — I340 Nonrheumatic mitral (valve) insufficiency: Secondary | ICD-10-CM | POA: Diagnosis not present

## 2016-10-15 DIAGNOSIS — I350 Nonrheumatic aortic (valve) stenosis: Secondary | ICD-10-CM | POA: Diagnosis not present

## 2016-10-15 DIAGNOSIS — I700 Atherosclerosis of aorta: Secondary | ICD-10-CM | POA: Insufficient documentation

## 2016-10-16 ENCOUNTER — Telehealth: Payer: Self-pay | Admitting: Cardiology

## 2016-10-16 ENCOUNTER — Other Ambulatory Visit: Payer: Self-pay

## 2016-10-16 DIAGNOSIS — I5189 Other ill-defined heart diseases: Secondary | ICD-10-CM

## 2016-10-16 DIAGNOSIS — I350 Nonrheumatic aortic (valve) stenosis: Secondary | ICD-10-CM

## 2016-10-16 DIAGNOSIS — I519 Heart disease, unspecified: Secondary | ICD-10-CM

## 2016-10-16 NOTE — Telephone Encounter (Signed)
Returned call to patient echo results given. 

## 2016-10-16 NOTE — Telephone Encounter (Signed)
Pt returned Cheryl's call please give him a call back.

## 2016-10-19 ENCOUNTER — Telehealth: Payer: Self-pay | Admitting: Cardiology

## 2016-10-19 NOTE — Telephone Encounter (Signed)
Please call concerning his appointment.

## 2016-10-19 NOTE — Telephone Encounter (Signed)
Returned call to patient's wife.Advised I just spoke to our scheduler and she said dobutamine echo is only done at our [facility] office.[facility] does not do them.Scheduler will call back with appointment and follow up appointment with Dr.Jordan.I will speak to Dr.Jordan when he is in office tomorrow and make sure appointment date is ok with him.I will call you back with his advice.

## 2016-10-19 NOTE — Telephone Encounter (Signed)
Called the patient and left a voicemail to call me directly to schedule his stress echo.

## 2016-10-29 ENCOUNTER — Telehealth (HOSPITAL_COMMUNITY): Payer: Self-pay | Admitting: *Deleted

## 2016-10-29 NOTE — Telephone Encounter (Signed)
Left message on voicemail per DPR in reference to upcoming appointment scheduled on 11/04/16 at 2:30 with detailed instructions given per Stress Test Requisition Sheet for the test. LM to arrive 30 minutes early, and that it is imperative to arrive on time for appointment to keep from having the test rescheduled. If you need to cancel or reschedule your appointment, please call the office within 24 hours of your appointment. Failure to do so may result in a cancellation of your appointment, and a $50 no show fee. Phone number given for call back for any questions. Jonathan Graham

## 2016-11-03 ENCOUNTER — Ambulatory Visit (HOSPITAL_COMMUNITY): Payer: Medicare Other | Attending: Cardiology

## 2016-11-03 ENCOUNTER — Ambulatory Visit (HOSPITAL_COMMUNITY): Payer: Medicare Other

## 2016-11-03 DIAGNOSIS — I447 Left bundle-branch block, unspecified: Secondary | ICD-10-CM | POA: Insufficient documentation

## 2016-11-03 DIAGNOSIS — I519 Heart disease, unspecified: Secondary | ICD-10-CM | POA: Diagnosis not present

## 2016-11-03 DIAGNOSIS — I350 Nonrheumatic aortic (valve) stenosis: Secondary | ICD-10-CM | POA: Diagnosis not present

## 2016-11-03 DIAGNOSIS — I700 Atherosclerosis of aorta: Secondary | ICD-10-CM | POA: Diagnosis not present

## 2016-11-03 DIAGNOSIS — I5189 Other ill-defined heart diseases: Secondary | ICD-10-CM

## 2016-11-03 MED ORDER — DOBUTAMINE INFUSION FOR EP/ECHO/NUC (1000 MCG/ML)
20.0000 ug/kg/min | Freq: Once | INTRAVENOUS | Status: AC
  Administered 2016-11-03: 20 ug/kg/min via INTRAVENOUS

## 2016-11-04 NOTE — Progress Notes (Signed)
Cardiology Office Note   Date:  11/06/2016   ID:  Jonathan Graham, DOB 31-Aug-1928, MRN [id number]  PCP:  [name], MD  Cardiologist: Aloria Looper [name] MD  Chief Complaint  Patient presents with  . Follow-up      History of Present Illness: Jonathan Graham is a 83 y.o. male who presents for follow up Aortic stenosis, LBBB, and HLD.     He does have a history of known left bundle branch block and a history of hypercholesterolemia. He has a past history of syncope but has had no recent syncopal episodes. He has a history of prostate CA treated with radiation therapy. The last level was 0.6 The patient has a history of moderate aortic stenosis.  He has known left bundle branch block. On his last visit a follow up Echo was obtained. This demonstrated at least moderate AS. EF had dropped from 40-45% to 25-30%. As such a Dobutamine Echo was ordered and he is seen back today to review results.    He stays physically active and enjoys working in his yard. His left knee limits his walking. He complains of pain in his lower legs more. He does have prominent varicose veins but no pain or inflammation. He does have to sit down and rest sometimes after walking up stairs now and may have some lightheadedness.    Past Medical History:  Diagnosis Date  . Arthritis   . Atypical chest pain    history of   . Back pain   . Cardiovascular stress test abnormal 04/25/2004   Abnormal adenosine Cardiolite study / evidence of inferoseptal ischemia &  an EF of 48% ""recommended that he undergo cardiac catheterization""  . Cervical spine disease   . Dizziness    occasionally apon bending over  . Fatigue    history of   . History of mononucleosis   . Hypercholesterolemia   . Left bundle branch block   . Pleurisy 07/2005  . S/P radiation therapy  12/05/2012 through 01/30/2013                                                         Prostate 7800 cGy 40 sessions, seminal vesicles 5600 cGy 40  sessions                       . Shortness of breath   . Spondylosis, cervical    at C3-C4  . Varicose veins   . Weakness    history of     Past Surgical History:  Procedure Laterality Date  . BACK SURGERY  1995    2 ruptured discs in lower back / by Dr. Louanne Skye  . CARDIAC CATHETERIZATION  2005   EF estimated at 50% /  PROCEDURE:  Left heart catheterization, coronary and left ventricular angiography /  RAO view demonstrates mild LV enlargement  / mild hypokinesia anteroseptal with  overall low normal LV systolic function / Normal coronary anatomy  . HEMORRHOID SURGERY    . PROSTATE BIOPSY  08/25/2012   Gleason 3+3=6 PSA 7.80  . TONSILLECTOMY AND ADENOIDECTOMY       Current Outpatient Prescriptions  Medication Sig Dispense Refill  . simvastatin (ZOCOR) 10 MG tablet TAKE 0.5 TABLETS (5 MG TOTAL) BY MOUTH AT BEDTIME. 45 tablet 2  .  tamsulosin (FLOMAX) 0.4 MG CAPS capsule Take 1 capsule (0.4 mg total) by mouth daily. 90 capsule 3   Current Facility-Administered Medications  Medication Dose Route Frequency Provider Last Rate Last Dose  . aspirin chewable tablet 81 mg  81 mg Oral Once Jonathan Lillo M [name], MD        Allergies:   Lipitor [atorvastatin calcium]    Social History:  The patient  reports that he quit smoking about 65 years ago. His smoking use included Cigarettes. He has a 7.00 pack-year smoking history. He has never used smokeless tobacco. He reports that he does not drink alcohol or use drugs.   Family History:  The patient's family history includes Cancer in his father; Heart disease in his mother; Heart failure in his mother; Lung cancer in his brother; Multiple sclerosis in his sister; Stroke in his father.    ROS:  Please see the history of present illness. In the past he had hyperthemia when out working in the yard. No infection identified. No recent episodes.  Otherwise, review of systems are positive for none.   All other systems are reviewed and negative.     PHYSICAL EXAM: VS:  BP 110/70 (BP Location: Right Arm, Patient Position: Sitting, Cuff Size: Normal)   Pulse 70   Ht 5\' 9"  (1.753 m)   Wt 168 lb 3.2 oz (76.3 kg)   BMI 24.84 kg/m  , BMI Body mass index is 24.84 kg/m. GEN: Well nourished, well developed, in no acute distress  HEENT: normal  Neck: no JVD, carotid bruits, or masses Cardiac: RRR;  There is a soft systolic ejection murmur at the base. There is no, rubs, or gallops,no edema  Respiratory:  clear to auscultation bilaterally, normal work of breathing GI: soft, nontender, nondistended, + BS MS: no deformity or atrophy  Skin: warm and dry, no rash Neuro:  Strength and sensation are intact Psych: euthymic mood, full affect   EKG:  EKG is not ordered today.   Recent Labs: 01/06/2016: Hemoglobin 12.1; Platelets 249.0 09/24/2016: ALT 9; BUN 17; Creat 1.00; Potassium 4.4; Sodium 138    Lipid Panel    Component Value Date/Time   CHOL 154 09/24/2016 0803   TRIG 55 09/24/2016 0803   HDL 58 09/24/2016 0803   CHOLHDL 2.7 09/24/2016 0803   VLDL 11 09/24/2016 0803   LDLCALC 85 09/24/2016 0803      Wt Readings from Last 3 Encounters:  11/06/16 168 lb 3.2 oz (76.3 kg)  11/03/16 171 lb (77.6 kg)  09/28/16 171 lb 3.2 oz (77.7 kg)     Echo: 11/02/14:Study Conclusions  - Left ventricle: The cavity size was normal. There was mild focal basal hypertrophy of the septum. Systolic function was mildly to moderately reduced. The estimated ejection fraction was in the range of 40% to 45%. Diffuse hypokinesis and paradoxical septal motion consistent with LBBB. Doppler parameters are consistent with abnormal left ventricular relaxation (grade 1 diastolic dysfunction). - Ventricular septum: Septal motion showed paradox. - Aortic valve: Valve mobility was restricted. There was moderate stenosis. There was trivial regurgitation. Mean gradient (S): 24 mm Hg. - Mitral valve: Calcified annulus. Mildly thickened  leaflets . There was mild regurgitation. - Left atrium: The atrium was mildly dilated. - Right ventricle: Systolic function was normal. - Right atrium: The atrium was normal in size. - Tricuspid valve: There was mild regurgitation. - Pulmonic valve: There was mild regurgitation. - Pulmonary arteries: Systolic pressure was within the normal range. PA peak pressure: 32 mm  Hg (S). - Inferior vena cava: The vessel was normal in size. - Pericardium, extracardiac: There was no pericardial effusion.  Impressions:  - Compared to the prior study from 2013 the aortic stenosis is now moderate.  Echo 10/15/16: Study Conclusions  - Left ventricle: The cavity size was normal. Wall thickness was   increased in a pattern of mild LVH. Systolic function was   severely reduced. The estimated ejection fraction was in the   range of 25% to 30%. Doppler parameters are consistent with   abnormal left ventricular relaxation (grade 1 diastolic   dysfunction). - Aortic valve: Moderately to severely calcified annulus.   Moderately thickened, moderately calcified leaflets. Valve area   (VTI): 1.27 cm^2. Valve area (Vmax): 1.3 cm^2. Valve area   (Vmean): 1.26 cm^2. - Mitral valve: There was mild regurgitation. - Right atrium: The atrium was moderately dilated.  Impressions:  - The arotic valve is heavily calcified with restricted leaflet   mobility.   The peak gradient is 32 with a mean gradient of 20. The LV   function is severely reduced .   Consider low gradient / low cardiac output severe aortic   stenosis.  Dobutamine Echo: 11/03/16:Study Conclusions  - Stress ECG conclusions: There were no stress arrhythmias or   conduction abnormalities. The stress ECG was negative for   ischemia. - Staged echo: There was no echocardiographic evidence for   stress-induced ischemia. - Impressions: Dobutamine Echo for low gradient AS:     Baseline EF moderately decreased did not have complete study  to   assess RWMAls but EF appeared 30-35% with diffuse hypokinesis and   abnormal   septal motion from LBBB.     Aortic valve severely calcified with restricted leaflet motion.     Baseline: CO: 5.1 L/min Peak Velocity 3.2 m/sec Mean gradient 23   mmHg Peak gradient 41 mmHg AVA 1.05 cm2   Peak: CO: 10/4 L/min Peak Velocity 4.46 m/sec Mean gradient 48   mmHg Peak gradient 79 mmHg AVA 1.5 cm2     Study would indicate moderate to severe AS. Diagnostic study as   there is good LV functional reserve with CO going up by   over 100% with dobutamine. Gradients increased but AVA also went   up with inotropic reserve. AVA at rest and peak over 1 cm2.  Impressions:  - Dobutamine Echo for low gradient AS:     Baseline EF moderately decreased did not have complete study to   assess RWMAls but EF appeared 30-35% with diffuse hypokinesis and   abnormal   septal motion from LBBB.     Aortic valve severely calcified with restricted leaflet motion.     Baseline: CO: 5.1 L/min Peak Velocity 3.2 m/sec Mean gradient 23   mmHg Peak gradient 41 mmHg AVA 1.05 cm2   Peak: CO: 10/4 L/min Peak Velocity 4.46 m/sec Mean gradient 48   mmHg Peak gradient 79 mmHg AVA 1.5 cm2     Study would indicate moderate to severe AS. Diagnostic study as   there is good LV functional reserve with CO going up by   over 100% with dobutamine. Gradients increased but AVA also went   up with inotropic reserve. AVA at rest and peak over 1 cm2.  ASSESSMENT AND PLAN:  1. Left bundle-branch block- chronic 2. Hypercholesterolemia- well controlled. 3. Prostate cancer treated with radiation therapy 4. Severe  aortic stenosis- some symptoms with dyspnea walking up stairs and some lightheadedness. Echo showed worsening LV function  with EF 25-30%. AV gradient underestimated. Dobutamine Echo showed LV reserve with increase in EF and Severe AS. Based on these results I think we need to consider AVR for this patient. I am concerned  that if untreated his LV function will continue to decline and he will have more problems with CHF. Will proceed with evaluation with [name]. The procedure and risks were reviewed including but not limited to death, myocardial infarction, stroke, arrythmias, bleeding, transfusion, emergency surgery, dye allergy, or renal dysfunction. The patient voices understanding and is agreeable to proceed. Will schedule for March 16. Patient may be a candidate for TAVR.    Current medicines are reviewed at length with the patient today.  The patient does not have concerns regarding medicines.  The following changes have been made: none  Labs/ tests ordered today include:   Orders Placed This Encounter  Procedures  . DG Chest 2 View  . Basic metabolic panel  . CBC w/Diff/Platelet  . INR/PT     Signed, Jonathan Lamere [name] MD, [facility]   11/06/2016 3:38 PM    [facility]

## 2016-11-06 ENCOUNTER — Ambulatory Visit (INDEPENDENT_AMBULATORY_CARE_PROVIDER_SITE_OTHER): Payer: Medicare Other | Admitting: Cardiology

## 2016-11-06 ENCOUNTER — Ambulatory Visit
Admission: RE | Admit: 2016-11-06 | Discharge: 2016-11-06 | Disposition: A | Discharge status: Home / Self Care | Payer: Medicare Other | Source: Ambulatory Visit | Attending: Cardiology | Admitting: Cardiology

## 2016-11-06 ENCOUNTER — Encounter: Payer: Self-pay | Admitting: Cardiology

## 2016-11-06 ENCOUNTER — Other Ambulatory Visit: Payer: Self-pay | Admitting: Cardiology

## 2016-11-06 VITALS — BP 110/70 | HR 70 | Ht 69.0 in | Wt 168.2 lb

## 2016-11-06 DIAGNOSIS — I358 Other nonrheumatic aortic valve disorders: Secondary | ICD-10-CM

## 2016-11-06 DIAGNOSIS — I447 Left bundle-branch block, unspecified: Secondary | ICD-10-CM | POA: Diagnosis not present

## 2016-11-06 DIAGNOSIS — R918 Other nonspecific abnormal finding of lung field: Secondary | ICD-10-CM | POA: Diagnosis not present

## 2016-11-06 DIAGNOSIS — I350 Nonrheumatic aortic (valve) stenosis: Secondary | ICD-10-CM

## 2016-11-06 NOTE — Patient Instructions (Signed)
Cardiac cath Friday 11/13/16  Follow instructions given.

## 2016-11-06 NOTE — Addendum Note (Signed)
Addended by: Kathyrn Lass on: 11/06/2016 03:51 PM   Modules accepted: Orders

## 2016-11-09 ENCOUNTER — Other Ambulatory Visit (INDEPENDENT_AMBULATORY_CARE_PROVIDER_SITE_OTHER): Payer: Medicare Other

## 2016-11-09 DIAGNOSIS — I358 Other nonrheumatic aortic valve disorders: Secondary | ICD-10-CM

## 2016-11-09 DIAGNOSIS — I447 Left bundle-branch block, unspecified: Secondary | ICD-10-CM | POA: Diagnosis not present

## 2016-11-09 DIAGNOSIS — I350 Nonrheumatic aortic (valve) stenosis: Secondary | ICD-10-CM

## 2016-11-09 LAB — PROTIME-INR
INR: 1.1 ratio — AB (ref 0.8–1.0)
Prothrombin Time: 11.9 s (ref 9.6–13.1)

## 2016-11-09 LAB — BASIC METABOLIC PANEL
BUN: 18 mg/dL (ref 6–23)
CALCIUM: 9.2 mg/dL (ref 8.4–10.5)
CO2: 27 mEq/L (ref 19–32)
CREATININE: 1.12 mg/dL (ref 0.40–1.50)
Chloride: 104 mEq/L (ref 96–112)
GFR: 65.89 mL/min (ref >60.00)
Glucose, Bld: 101 mg/dL — ABNORMAL HIGH (ref 70–99)
Potassium: 4.3 mEq/L (ref 3.5–5.1)
Sodium: 139 mEq/L (ref 135–145)

## 2016-11-09 LAB — CBC WITH DIFFERENTIAL/PLATELET
BASOS ABS: 0.1 10*3/uL (ref 0.0–0.1)
BASOS PCT: 2.7 % (ref 0.0–3.0)
EOS PCT: 9 % — AB (ref 0.0–5.0)
Eosinophils Absolute: 0.5 10*3/uL (ref 0.0–0.7)
HCT: 39.1 % (ref 39.0–52.0)
Hemoglobin: 13 g/dL (ref 13.0–17.0)
LYMPHS ABS: 1.1 10*3/uL (ref 0.7–4.0)
Lymphocytes Relative: 20.1 % (ref 12.0–46.0)
MCHC: 33.4 g/dL (ref 30.0–36.0)
MCV: 89 fl (ref 78.0–100.0)
MONOS PCT: 10.1 % (ref 3.0–12.0)
Monocytes Absolute: 0.5 10*3/uL (ref 0.1–1.0)
NEUTROS ABS: 3.1 10*3/uL (ref 1.4–7.7)
NEUTROS PCT: 58.1 % (ref 43.0–77.0)
PLATELETS: 246 10*3/uL (ref 150.0–400.0)
RBC: 4.39 Mil/uL (ref 4.22–5.81)
RDW: 13.8 % (ref 11.5–15.5)
WBC: 5.4 10*3/uL (ref 4.0–10.5)

## 2016-11-10 ENCOUNTER — Telehealth: Payer: Self-pay | Admitting: Cardiology

## 2016-11-10 NOTE — Telephone Encounter (Signed)
Returned call-patient unsure if he is having a cath or TAVR procedure Friday 3/16.   Explained to patient and wife that Friday will be a heart cath only.  Explained that TAVR will be further discussed at follow up appointment on 4/4 with Dr. [name].  Verbalized understanding and advised to call with further questions or concerns.

## 2016-11-10 NOTE — Telephone Encounter (Signed)
New Message    Per wife has some questions regarding patients procedure on Friday. Requesting a call back from nurse

## 2016-11-10 NOTE — Telephone Encounter (Signed)
Follow up    Returning [place] call about her husbands surgery

## 2016-11-10 NOTE — Telephone Encounter (Signed)
Returned call to patient's wife no answer.LMTC. 

## 2016-11-10 NOTE — Telephone Encounter (Signed)
Returned call to patient's wife.She stated husband is having a cath this Friday 3/16.Stated she has a detached retina and she will be having surgery this Thurs 3/15.Stated she wanted to ask Dr.Jordan when can husband resume normal activity after cath since he will have to help her after her surgery.Advised Dr.Jordan out of office this afternoon. I will send message to him for advice.Advised I will be out of office 3/14.Rip Harbour will call back with Dr.Jordan's advice.

## 2016-11-10 NOTE — Telephone Encounter (Signed)
Jonathan Graham is wanting to speak with you about Surgery that is for Jonathan Graham. She just found out she is to have surgery herself. Please call .[name]  Thanks

## 2016-11-11 NOTE — Telephone Encounter (Signed)
He will be unable to use his right arm for one week post cath. If this is an issue with caring for her we can postpone cath for a later time. Since he will get sedation he also will be unable to drive for 24 hours so would need someone to drive him for his procedure.  Anessa Charley [name] MD, [facility]

## 2016-11-11 NOTE — Telephone Encounter (Signed)
Advised wife  

## 2016-11-13 ENCOUNTER — Encounter (HOSPITAL_COMMUNITY)
Admission: RE | Disposition: A | Discharge status: Home / Self Care | Payer: Self-pay | Source: Ambulatory Visit | Attending: Cardiology

## 2016-11-13 ENCOUNTER — Encounter (HOSPITAL_COMMUNITY): Payer: Self-pay | Admitting: Cardiology

## 2016-11-13 ENCOUNTER — Ambulatory Visit (HOSPITAL_COMMUNITY)
Admission: RE | Admit: 2016-11-13 | Discharge: 2016-11-13 | Disposition: A | Discharge status: Home / Self Care | Payer: Medicare Other | Source: Ambulatory Visit | Attending: Cardiology | Admitting: Cardiology

## 2016-11-13 DIAGNOSIS — I2510 Atherosclerotic heart disease of native coronary artery without angina pectoris: Secondary | ICD-10-CM | POA: Insufficient documentation

## 2016-11-13 DIAGNOSIS — I5022 Chronic systolic (congestive) heart failure: Secondary | ICD-10-CM | POA: Diagnosis present

## 2016-11-13 DIAGNOSIS — Z809 Family history of malignant neoplasm, unspecified: Secondary | ICD-10-CM | POA: Insufficient documentation

## 2016-11-13 DIAGNOSIS — Z8619 Personal history of other infectious and parasitic diseases: Secondary | ICD-10-CM | POA: Diagnosis not present

## 2016-11-13 DIAGNOSIS — Z888 Allergy status to other drugs, medicaments and biological substances status: Secondary | ICD-10-CM | POA: Diagnosis not present

## 2016-11-13 DIAGNOSIS — M479 Spondylosis, unspecified: Secondary | ICD-10-CM | POA: Insufficient documentation

## 2016-11-13 DIAGNOSIS — Z87891 Personal history of nicotine dependence: Secondary | ICD-10-CM | POA: Insufficient documentation

## 2016-11-13 DIAGNOSIS — Z820 Family history of epilepsy and other diseases of the nervous system: Secondary | ICD-10-CM | POA: Insufficient documentation

## 2016-11-13 DIAGNOSIS — I350 Nonrheumatic aortic (valve) stenosis: Secondary | ICD-10-CM | POA: Insufficient documentation

## 2016-11-13 DIAGNOSIS — Z923 Personal history of irradiation: Secondary | ICD-10-CM | POA: Insufficient documentation

## 2016-11-13 DIAGNOSIS — Z801 Family history of malignant neoplasm of trachea, bronchus and lung: Secondary | ICD-10-CM | POA: Insufficient documentation

## 2016-11-13 DIAGNOSIS — I447 Left bundle-branch block, unspecified: Secondary | ICD-10-CM | POA: Insufficient documentation

## 2016-11-13 DIAGNOSIS — Z7982 Long term (current) use of aspirin: Secondary | ICD-10-CM | POA: Insufficient documentation

## 2016-11-13 DIAGNOSIS — E785 Hyperlipidemia, unspecified: Secondary | ICD-10-CM | POA: Insufficient documentation

## 2016-11-13 DIAGNOSIS — E7800 Pure hypercholesterolemia, unspecified: Secondary | ICD-10-CM | POA: Diagnosis not present

## 2016-11-13 DIAGNOSIS — Z8546 Personal history of malignant neoplasm of prostate: Secondary | ICD-10-CM | POA: Diagnosis not present

## 2016-11-13 DIAGNOSIS — M549 Dorsalgia, unspecified: Secondary | ICD-10-CM | POA: Insufficient documentation

## 2016-11-13 DIAGNOSIS — M1990 Unspecified osteoarthritis, unspecified site: Secondary | ICD-10-CM | POA: Insufficient documentation

## 2016-11-13 DIAGNOSIS — Z79899 Other long term (current) drug therapy: Secondary | ICD-10-CM | POA: Diagnosis not present

## 2016-11-13 DIAGNOSIS — Z8249 Family history of ischemic heart disease and other diseases of the circulatory system: Secondary | ICD-10-CM | POA: Diagnosis not present

## 2016-11-13 DIAGNOSIS — Z823 Family history of stroke: Secondary | ICD-10-CM | POA: Diagnosis not present

## 2016-11-13 HISTORY — PX: RIGHT/LEFT HEART CATH AND CORONARY ANGIOGRAPHY: CATH118266

## 2016-11-13 LAB — POCT I-STAT 3, ART BLOOD GAS (G3+)
Acid-base deficit: 2 mmol/L (ref 0.0–2.0)
Bicarbonate: 22.8 mmol/L (ref 20.0–28.0)
O2 Saturation: 94 %
PCO2 ART: 36.5 mmHg (ref 32.0–48.0)
PH ART: 7.404 (ref 7.350–7.450)
TCO2: 24 mmol/L (ref 0–100)
pO2, Arterial: 72 mmHg — ABNORMAL LOW (ref 83.0–108.0)

## 2016-11-13 LAB — POCT I-STAT 3, VENOUS BLOOD GAS (G3P V)
ACID-BASE DEFICIT: 2 mmol/L (ref 0.0–2.0)
Bicarbonate: 23.4 mmol/L (ref 20.0–28.0)
O2 Saturation: 75 %
PCO2 VEN: 40.9 mmHg — AB (ref 44.0–60.0)
TCO2: 25 mmol/L (ref 0–100)
pH, Ven: 7.367 (ref 7.250–7.430)
pO2, Ven: 41 mmHg (ref 32.0–45.0)

## 2016-11-13 SURGERY — RIGHT/LEFT HEART CATH AND CORONARY ANGIOGRAPHY
Anesthesia: LOCAL

## 2016-11-13 MED ORDER — HEPARIN (PORCINE) IN NACL 2-0.9 UNIT/ML-% IJ SOLN
INTRAMUSCULAR | Status: DC | PRN
  Administered 2016-11-13: 1000 mL

## 2016-11-13 MED ORDER — IOPAMIDOL (ISOVUE-370) INJECTION 76%
INTRAVENOUS | Status: AC
  Filled 2016-11-13: qty 100

## 2016-11-13 MED ORDER — SODIUM CHLORIDE 0.9% FLUSH: 3.0000 mL | INTRAVENOUS | Status: DC | PRN

## 2016-11-13 MED ORDER — FENTANYL CITRATE (PF) 100 MCG/2ML IJ SOLN
INTRAMUSCULAR | Status: DC | PRN
  Administered 2016-11-13: 25 ug via INTRAVENOUS

## 2016-11-13 MED ORDER — LIDOCAINE HCL (PF) 1 % IJ SOLN
INTRAMUSCULAR | Status: DC | PRN
  Administered 2016-11-13 (×2): 2 mL via INTRADERMAL

## 2016-11-13 MED ORDER — SODIUM CHLORIDE 0.9% FLUSH: 3.0000 mL | Freq: Two times a day (BID) | INTRAVENOUS | Status: DC

## 2016-11-13 MED ORDER — ASPIRIN 81 MG PO CHEW: 81.0000 mg | CHEWABLE_TABLET | ORAL | Status: DC

## 2016-11-13 MED ORDER — MIDAZOLAM HCL 2 MG/2ML IJ SOLN
INTRAMUSCULAR | Status: AC
  Filled 2016-11-13: qty 2

## 2016-11-13 MED ORDER — SODIUM CHLORIDE 0.9 % IV SOLN: 250.0000 mL | INTRAVENOUS | Status: DC | PRN

## 2016-11-13 MED ORDER — SODIUM CHLORIDE 0.9 % IV SOLN
INTRAVENOUS | Status: DC
  Administered 2016-11-13: 08:00:00 via INTRAVENOUS

## 2016-11-13 MED ORDER — VERAPAMIL HCL 2.5 MG/ML IV SOLN
INTRAVENOUS | Status: AC
  Filled 2016-11-13: qty 2

## 2016-11-13 MED ORDER — HEPARIN (PORCINE) IN NACL 2-0.9 UNIT/ML-% IJ SOLN
INTRAMUSCULAR | Status: AC
  Filled 2016-11-13: qty 1000

## 2016-11-13 MED ORDER — SODIUM CHLORIDE 0.9 % WEIGHT BASED INFUSION: 1.0000 mL/kg/h | INTRAVENOUS | Status: AC

## 2016-11-13 MED ORDER — IOPAMIDOL (ISOVUE-370) INJECTION 76%
INTRAVENOUS | Status: DC | PRN
  Administered 2016-11-13: 90 mL via INTRA_ARTERIAL

## 2016-11-13 MED ORDER — HEPARIN SODIUM (PORCINE) 1000 UNIT/ML IJ SOLN
INTRAMUSCULAR | Status: AC
  Filled 2016-11-13: qty 1

## 2016-11-13 MED ORDER — LIDOCAINE HCL (PF) 1 % IJ SOLN
INTRAMUSCULAR | Status: AC
  Filled 2016-11-13: qty 30

## 2016-11-13 MED ORDER — MIDAZOLAM HCL 2 MG/2ML IJ SOLN
INTRAMUSCULAR | Status: DC | PRN
  Administered 2016-11-13: 1 mg via INTRAVENOUS

## 2016-11-13 MED ORDER — HEPARIN SODIUM (PORCINE) 1000 UNIT/ML IJ SOLN
INTRAMUSCULAR | Status: DC | PRN
  Administered 2016-11-13: 4000 [IU] via INTRAVENOUS

## 2016-11-13 MED ORDER — HEPARIN (PORCINE) IN NACL 2-0.9 UNIT/ML-% IJ SOLN
INTRAMUSCULAR | Status: DC | PRN
  Administered 2016-11-13: 10 mL via INTRA_ARTERIAL

## 2016-11-13 MED ORDER — FENTANYL CITRATE (PF) 100 MCG/2ML IJ SOLN
INTRAMUSCULAR | Status: AC
  Filled 2016-11-13: qty 2

## 2016-11-13 SURGICAL SUPPLY — 15 items
CATH 5FR JL3.5 JR4 ANG PIG MP (CATHETERS) ×1 IMPLANT
CATH BALLN WEDGE 5F 110CM (CATHETERS) ×1 IMPLANT
CATH EXPO 5FR FL4 (CATHETERS) ×1 IMPLANT
DEVICE RAD COMP TR BAND LRG (VASCULAR PRODUCTS) ×1 IMPLANT
GLIDESHEATH SLEND SS 6F .021 (SHEATH) ×1 IMPLANT
GUIDEWIRE INQWIRE 1.5J.035X260 (WIRE) IMPLANT
INQWIRE 1.5J .035X260CM (WIRE) ×2
KIT HEART LEFT (KITS) ×2 IMPLANT
PACK CARDIAC CATHETERIZATION (CUSTOM PROCEDURE TRAY) ×2 IMPLANT
SHEATH FAST CATH BRACH 5F 5CM (SHEATH) ×1 IMPLANT
SYR MEDRAD MARK V 150ML (SYRINGE) ×2 IMPLANT
TRANSDUCER W/STOPCOCK (MISCELLANEOUS) ×2 IMPLANT
TUBING CIL FLEX 10 FLL-RA (TUBING) ×2 IMPLANT
WIRE EMERALD 3MM-J .035X150CM (WIRE) IMPLANT
WIRE EMERALD ST .035X150CM (WIRE) ×1 IMPLANT

## 2016-11-13 NOTE — Interval H&P Note (Signed)
History and Physical Interval Note:  11/13/2016 8:50 AM  Jonathan Graham  has presented today for surgery, with the diagnosis of aortic stenosis  The various methods of treatment have been discussed with the patient and family. After consideration of risks, benefits and other options for treatment, the patient has consented to  Procedure(s): Right/Left Heart Cath and Coronary Angiography (N/A) as a surgical intervention .  The patient's history has been reviewed, patient examined, no change in status, stable for surgery.  I have reviewed the patient's chart and labs.  Questions were answered to the patient's satisfaction.     Collier Salina [facility] 11/13/2016 8:50 AM

## 2016-11-13 NOTE — H&P (View-Only) (Signed)
Cardiology Office Note   Date:  11/06/2016   ID:  Jonathan Graham, DOB 1927/11/06, MRN [id number]  PCP:  [name], MD  Cardiologist: Keiasha Diep [name] MD  Chief Complaint  Patient presents with  . Follow-up      History of Present Illness: Jonathan Graham is a 82 y.o. male who presents for follow up Aortic stenosis, LBBB, and HLD.     He does have a history of known left bundle branch block and a history of hypercholesterolemia. He has a past history of syncope but has had no recent syncopal episodes. He has a history of prostate CA treated with radiation therapy. The last level was 0.6 The patient has a history of moderate aortic stenosis.  He has known left bundle branch block. On his last visit a follow up Echo was obtained. This demonstrated at least moderate AS. EF had dropped from 40-45% to 25-30%. As such a Dobutamine Echo was ordered and he is seen back today to review results.    He stays physically active and enjoys working in his yard. His left knee limits his walking. He complains of pain in his lower legs more. He does have prominent varicose veins but no pain or inflammation. He does have to sit down and rest sometimes after walking up stairs now and may have some lightheadedness.    Past Medical History:  Diagnosis Date  . Arthritis   . Atypical chest pain    history of   . Back pain   . Cardiovascular stress test abnormal 04/25/2004   Abnormal adenosine Cardiolite study / evidence of inferoseptal ischemia &  an EF of 48% ""recommended that he undergo cardiac catheterization""  . Cervical spine disease   . Dizziness    occasionally apon bending over  . Fatigue    history of   . History of mononucleosis   . Hypercholesterolemia   . Left bundle branch block   . Pleurisy 07/2005  . S/P radiation therapy  12/05/2012 through 01/30/2013                                                         Prostate 7800 cGy 40 sessions, seminal vesicles 5600 cGy 40  sessions                       . Shortness of breath   . Spondylosis, cervical    at C3-C4  . Varicose veins   . Weakness    history of     Past Surgical History:  Procedure Laterality Date  . BACK SURGERY  1995    2 ruptured discs in lower back / by Dr. Louanne Skye  . CARDIAC CATHETERIZATION  2005   EF estimated at 50% /  PROCEDURE:  Left heart catheterization, coronary and left ventricular angiography /  RAO view demonstrates mild LV enlargement  / mild hypokinesia anteroseptal with  overall low normal LV systolic function / Normal coronary anatomy  . HEMORRHOID SURGERY    . PROSTATE BIOPSY  08/25/2012   Gleason 3+3=6 PSA 7.80  . TONSILLECTOMY AND ADENOIDECTOMY       Current Outpatient Prescriptions  Medication Sig Dispense Refill  . simvastatin (ZOCOR) 10 MG tablet TAKE 0.5 TABLETS (5 MG TOTAL) BY MOUTH AT BEDTIME. 45 tablet 2  .  tamsulosin (FLOMAX) 0.4 MG CAPS capsule Take 1 capsule (0.4 mg total) by mouth daily. 90 capsule 3   Current Facility-Administered Medications  Medication Dose Route Frequency Provider Last Rate Last Dose  . aspirin chewable tablet 81 mg  81 mg Oral Once Jelena Malicoat M [name], MD        Allergies:   Lipitor [atorvastatin calcium]    Social History:  The patient  reports that he quit smoking about 65 years ago. His smoking use included Cigarettes. He has a 7.00 pack-year smoking history. He has never used smokeless tobacco. He reports that he does not drink alcohol or use drugs.   Family History:  The patient's family history includes Cancer in his father; Heart disease in his mother; Heart failure in his mother; Lung cancer in his brother; Multiple sclerosis in his sister; Stroke in his father.    ROS:  Please see the history of present illness. In the past he had hyperthemia when out working in the yard. No infection identified. No recent episodes.  Otherwise, review of systems are positive for none.   All other systems are reviewed and negative.     PHYSICAL EXAM: VS:  BP 110/70 (BP Location: Right Arm, Patient Position: Sitting, Cuff Size: Normal)   Pulse 70   Ht 5\' 9"  (1.753 m)   Wt 168 lb 3.2 oz (76.3 kg)   BMI 24.84 kg/m  , BMI Body mass index is 24.84 kg/m. GEN: Well nourished, well developed, in no acute distress  HEENT: normal  Neck: no JVD, carotid bruits, or masses Cardiac: RRR;  There is a soft systolic ejection murmur at the base. There is no, rubs, or gallops,no edema  Respiratory:  clear to auscultation bilaterally, normal work of breathing GI: soft, nontender, nondistended, + BS MS: no deformity or atrophy  Skin: warm and dry, no rash Neuro:  Strength and sensation are intact Psych: euthymic mood, full affect   EKG:  EKG is not ordered today.   Recent Labs: 01/06/2016: Hemoglobin 12.1; Platelets 249.0 09/24/2016: ALT 9; BUN 17; Creat 1.00; Potassium 4.4; Sodium 138    Lipid Panel    Component Value Date/Time   CHOL 154 09/24/2016 0803   TRIG 55 09/24/2016 0803   HDL 58 09/24/2016 0803   CHOLHDL 2.7 09/24/2016 0803   VLDL 11 09/24/2016 0803   LDLCALC 85 09/24/2016 0803      Wt Readings from Last 3 Encounters:  11/06/16 168 lb 3.2 oz (76.3 kg)  11/03/16 171 lb (77.6 kg)  09/28/16 171 lb 3.2 oz (77.7 kg)     Echo: 11/02/14:Study Conclusions  - Left ventricle: The cavity size was normal. There was mild focal basal hypertrophy of the septum. Systolic function was mildly to moderately reduced. The estimated ejection fraction was in the range of 40% to 45%. Diffuse hypokinesis and paradoxical septal motion consistent with LBBB. Doppler parameters are consistent with abnormal left ventricular relaxation (grade 1 diastolic dysfunction). - Ventricular septum: Septal motion showed paradox. - Aortic valve: Valve mobility was restricted. There was moderate stenosis. There was trivial regurgitation. Mean gradient (S): 24 mm Hg. - Mitral valve: Calcified annulus. Mildly thickened  leaflets . There was mild regurgitation. - Left atrium: The atrium was mildly dilated. - Right ventricle: Systolic function was normal. - Right atrium: The atrium was normal in size. - Tricuspid valve: There was mild regurgitation. - Pulmonic valve: There was mild regurgitation. - Pulmonary arteries: Systolic pressure was within the normal range. PA peak pressure: 32 mm  Hg (S). - Inferior vena cava: The vessel was normal in size. - Pericardium, extracardiac: There was no pericardial effusion.  Impressions:  - Compared to the prior study from 2013 the aortic stenosis is now moderate.  Echo 10/15/16: Study Conclusions  - Left ventricle: The cavity size was normal. Wall thickness was   increased in a pattern of mild LVH. Systolic function was   severely reduced. The estimated ejection fraction was in the   range of 25% to 30%. Doppler parameters are consistent with   abnormal left ventricular relaxation (grade 1 diastolic   dysfunction). - Aortic valve: Moderately to severely calcified annulus.   Moderately thickened, moderately calcified leaflets. Valve area   (VTI): 1.27 cm^2. Valve area (Vmax): 1.3 cm^2. Valve area   (Vmean): 1.26 cm^2. - Mitral valve: There was mild regurgitation. - Right atrium: The atrium was moderately dilated.  Impressions:  - The arotic valve is heavily calcified with restricted leaflet   mobility.   The peak gradient is 32 with a mean gradient of 20. The LV   function is severely reduced .   Consider low gradient / low cardiac output severe aortic   stenosis.  Dobutamine Echo: 11/03/16:Study Conclusions  - Stress ECG conclusions: There were no stress arrhythmias or   conduction abnormalities. The stress ECG was negative for   ischemia. - Staged echo: There was no echocardiographic evidence for   stress-induced ischemia. - Impressions: Dobutamine Echo for low gradient AS:     Baseline EF moderately decreased did not have complete study  to   assess RWMAls but EF appeared 30-35% with diffuse hypokinesis and   abnormal   septal motion from LBBB.     Aortic valve severely calcified with restricted leaflet motion.     Baseline: CO: 5.1 L/min Peak Velocity 3.2 m/sec Mean gradient 23   mmHg Peak gradient 41 mmHg AVA 1.05 cm2   Peak: CO: 10/4 L/min Peak Velocity 4.46 m/sec Mean gradient 48   mmHg Peak gradient 79 mmHg AVA 1.5 cm2     Study would indicate moderate to severe AS. Diagnostic study as   there is good LV functional reserve with CO going up by   over 100% with dobutamine. Gradients increased but AVA also went   up with inotropic reserve. AVA at rest and peak over 1 cm2.  Impressions:  - Dobutamine Echo for low gradient AS:     Baseline EF moderately decreased did not have complete study to   assess RWMAls but EF appeared 30-35% with diffuse hypokinesis and   abnormal   septal motion from LBBB.     Aortic valve severely calcified with restricted leaflet motion.     Baseline: CO: 5.1 L/min Peak Velocity 3.2 m/sec Mean gradient 23   mmHg Peak gradient 41 mmHg AVA 1.05 cm2   Peak: CO: 10/4 L/min Peak Velocity 4.46 m/sec Mean gradient 48   mmHg Peak gradient 79 mmHg AVA 1.5 cm2     Study would indicate moderate to severe AS. Diagnostic study as   there is good LV functional reserve with CO going up by   over 100% with dobutamine. Gradients increased but AVA also went   up with inotropic reserve. AVA at rest and peak over 1 cm2.  ASSESSMENT AND PLAN:  1. Left bundle-branch block- chronic 2. Hypercholesterolemia- well controlled. 3. Prostate cancer treated with radiation therapy 4. Severe  aortic stenosis- some symptoms with dyspnea walking up stairs and some lightheadedness. Echo showed worsening LV function  with EF 25-30%. AV gradient underestimated. Dobutamine Echo showed LV reserve with increase in EF and Severe AS. Based on these results I think we need to consider AVR for this patient. I am concerned  that if untreated his LV function will continue to decline and he will have more problems with CHF. Will proceed with evaluation with [facility]. The procedure and risks were reviewed including but not limited to death, myocardial infarction, stroke, arrythmias, bleeding, transfusion, emergency surgery, dye allergy, or renal dysfunction. The patient voices understanding and is agreeable to proceed. Will schedule for March 16. Patient may be a candidate for TAVR.    Current medicines are reviewed at length with the patient today.  The patient does not have concerns regarding medicines.  The following changes have been made: none  Labs/ tests ordered today include:   Orders Placed This Encounter  Procedures  . DG Chest 2 View  . Basic metabolic panel  . CBC w/Diff/Platelet  . INR/PT     Signed, Daxtyn Rottenberg [name] MD, [facility]   11/06/2016 3:38 PM     [facility]

## 2016-11-13 NOTE — Discharge Instructions (Signed)
Radial Site Care °Refer to this sheet in the next few weeks. These instructions provide you with information about caring for yourself after your procedure. Your health care provider may also give you more specific instructions. Your treatment has been planned according to current medical practices, but problems sometimes occur. Call your health care provider if you have any problems or questions after your procedure. °What can I expect after the procedure? °After your procedure, it is typical to have the following: °· Bruising at the radial site that usually fades within 1-2 weeks. °· Blood collecting in the tissue (hematoma) that may be painful to the touch. It should usually decrease in size and tenderness within 1-2 weeks. °Follow these instructions at home: °· Take medicines only as directed by your health care provider. °· You may shower 24-48 hours after the procedure or as directed by your health care provider. Remove the bandage (dressing) and gently wash the site with plain soap and water. Pat the area dry with a clean towel. Do not rub the site, because this may cause bleeding. °· Do not take baths, swim, or use a hot tub until your health care provider approves. °· Check your insertion site every day for redness, swelling, or drainage. °· Do not apply powder or lotion to the site. °· Do not flex or bend the affected arm for 24 hours or as directed by your health care provider. °· Do not push or pull heavy objects with the affected arm for 24 hours or as directed by your health care provider. °· Do not lift over 10 lb (4.5 kg) for 5 days after your procedure or as directed by your health care provider. °· Ask your health care provider when it is okay to: °¨ Return to work or school. °¨ Resume usual physical activities or sports. °¨ Resume sexual activity. °· Do not drive home if you are discharged the same day as the procedure. Have someone else drive you. °· You may drive 24 hours after the procedure  unless otherwise instructed by your health care provider. °· Do not operate machinery or power tools for 24 hours after the procedure. °· If your procedure was done as an outpatient procedure, which means that you went home the same day as your procedure, a responsible adult should be with you for the first 24 hours after you arrive home. °· Keep all follow-up visits as directed by your health care provider. This is important. °Contact a health care provider if: °· You have a fever. °· You have chills. °· You have increased bleeding from the radial site. Hold pressure on the site. CALL 911 °Get help right away if: °· You have unusual pain at the radial site. °· You have redness, warmth, or swelling at the radial site. °· You have drainage (other than a small amount of blood on the dressing) from the radial site. °· The radial site is bleeding, and the bleeding does not stop after 30 minutes of holding steady pressure on the site. °· Your arm or hand becomes pale, cool, tingly, or numb. °This information is not intended to replace advice given to you by your health care provider. Make sure you discuss any questions you have with your health care provider. °Document Released: 09/19/2010 Document Revised: 01/23/2016 Document Reviewed: 03/05/2014 °[employer] Interactive Patient Education © 2017 [employer] ° ° °

## 2016-11-19 ENCOUNTER — Encounter: Payer: Self-pay | Admitting: Surgery

## 2016-11-19 ENCOUNTER — Institutional Professional Consult (permissible substitution) (INDEPENDENT_AMBULATORY_CARE_PROVIDER_SITE_OTHER): Payer: Medicare Other | Admitting: Surgery

## 2016-11-19 VITALS — BP 129/77 | HR 75 | Resp 16 | Ht 69.0 in | Wt 160.0 lb

## 2016-11-19 DIAGNOSIS — I350 Nonrheumatic aortic (valve) stenosis: Secondary | ICD-10-CM | POA: Diagnosis not present

## 2016-11-20 ENCOUNTER — Encounter: Payer: Self-pay | Admitting: Surgery

## 2016-11-20 NOTE — Progress Notes (Signed)
Patient ID: Jonathan Graham, male   DOB: 22-Sep-1927, 83 y.o.   MRN: [id number]   [facility]SULTATION REPORT  Referring Provider is [name], [name], MD PCP is [name], MD  Chief Complaint  Patient presents with  . Aortic Stenosis    SEVERE...eval for TAVR...ECHO 10/15/16, CATH 11/13/16, STRESS ECHO 11/03/16    HPI:  The patient is an 83 year old gentleman with a history of hypercholesterolemia, LBBB, hx or prostate cancer in 2014 treated with XRT and known moderate AS by echo. His echo in 10/2014 showed a mean gradient of 24 mm Hg and an LVEF of 40-45%. His mean gradient in 07/2012 was 15 mm Hg and the EF was the same. His initial echo in 2007 showed an EF of 55-60%. He says that he has been in his usual state of health and was scheduled for a routine follow up echo on 10/15/2016. This showed a mean AV gradient of 20 mm Hg with a peak of 32. The aortic valve is heavily calcified and trileaflet with restricted mobility. His EF was down to 25-30% with mild MR. A dobutamine stress echo showed significant contractile reserve with a doubling of cardiac output on peak dobutamine and a rise in the mean gradient from 23 to 48 mm Hg. He subsequently underwent a right and left heart cath showing mild nonobstructive CAD with normal right heart pressures and normal LV filling pressure with a mean AV gradient of 26 mm Hg.  The patient remains physically active working in his yard and chopping wood. He walks up a couple flights of stairs daily with some shortness of breath. His wife is with him today and feels like he is more symptomatic than he is reporting and she has noted decline in his activity over the past year. He has some dizziness but has never had syncope.  Past Medical History:  Diagnosis Date  . Arthritis   . Atypical chest pain    history of   . Back pain   . Cardiovascular stress test abnormal 04/25/2004    Abnormal adenosine Cardiolite study / evidence of inferoseptal ischemia &  an EF of 48% ""recommended that he undergo cardiac catheterization""  . Cervical spine disease   . Dizziness    occasionally apon bending over  . Fatigue    history of   . History of mononucleosis   . Hypercholesterolemia   . Left bundle branch block   . Pleurisy 07/2005  . S/P radiation therapy  12/05/2012 through 01/30/2013                                                         Prostate 7800 cGy 40 sessions, seminal vesicles 5600 cGy 40 sessions                       . Shortness of breath   . Spondylosis, cervical    at C3-C4  . Varicose veins   . Weakness    history of     Past Surgical History:  Procedure Laterality Date  . BACK SURGERY  1995    2 ruptured discs in lower back / by Dr. Louanne Skye  . CARDIAC CATHETERIZATION  2005   EF estimated at 50% /  PROCEDURE:  Left heart catheterization, coronary and left ventricular angiography /  RAO view demonstrates mild LV enlargement  / mild hypokinesia anteroseptal with  overall low normal LV systolic function / Normal coronary anatomy  . HEMORRHOID SURGERY    . PROSTATE BIOPSY  08/25/2012   Gleason 3+3=6 PSA 7.80  . RIGHT/LEFT HEART CATH AND CORONARY ANGIOGRAPHY N/A 11/13/2016   Procedure: Right/Left Heart Cath and Coronary Angiography;  Surgeon: [name] [name], MD;  Location: [facility];  Service: Cardiovascular;  Laterality: N/A;  . TONSILLECTOMY AND ADENOIDECTOMY      Family History  Problem Relation Age of Onset  . Stroke Father   . Cancer Father   . Heart failure Mother   . Heart disease Mother   . Multiple sclerosis Sister   . Lung cancer Brother     Social History   Social History  . Marital status: Married    Spouse name: N/A  . Number of children: 2  . 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 Smoking status: Former Smoker    Packs/day: 1.00     Years: 7.00    Types: Cigarettes    Quit date: 09/01/1951  . Smokeless tobacco: Never Used  . Alcohol use No  . Drug use: No  . Sexual activity: Not on file   Other Topics Concern  . Not on file   Social History Narrative  . No narrative on file    Current Outpatient Prescriptions  Medication Sig Dispense Refill  . simvastatin (ZOCOR) 10 MG tablet TAKE 0.5 TABLETS (5 MG TOTAL) BY MOUTH AT BEDTIME. 45 tablet 2  . tamsulosin (FLOMAX) 0.4 MG CAPS capsule Take 1 capsule (0.4 mg total) by mouth daily. 90 capsule 3   Current Facility-Administered Medications  Medication Dose Route Frequency Provider Last Rate Last Dose  . aspirin chewable tablet 81 mg  81 mg Oral Once [name] [name], MD        Allergies  Allergen Reactions  . Lipitor [Atorvastatin Calcium] Other (See Comments)    cramps      Review of Systems:   General:  normal appetite, decreased energy, no weight gain, no weight loss, no fever  Cardiac:  no chest pain with exertion, no chest pain at rest, mild SOB with moderate exertion, no resting SOB, no PND, no orthopnea, no palpitations, no arrhythmia, no atrial fibrillation, some LE edema, occasional dizzy spells, no syncope  Respiratory:  exertional shortness of breath, no home oxygen, no productive cough, no dry cough, no bronchitis, no wheezing, no hemoptysis, no asthma, no pain with inspiration or cough, no sleep apnea, no CPAP at night  GI:   no difficulty swallowing, no reflux, no frequent heartburn, no hiatal hernia, no abdominal pain, no constipation, no diarrhea, no hematochezia, no hematemesis, no melena  GU:   no dysuria,  no frequency, no urinary tract infection, no hematuria, no enlarged prostate, no kidney stones, no kidney disease  Vascular:  no pain suggestive of claudication, no pain in feet, no leg cramps, has varicose veins worse in the right leg, no DVT, no non-healing foot ulcer  Neuro:   no stroke, no TIA's, no seizures, no headaches, no temporary  blindness one eye,  no slurred speech, no peripheral neuropathy, no chronic pain, no instability of gait, no memory/cognitive dysfunction  Musculoskeletal: no arthritis, no joint swelling, no myalgias, no difficulty walking, normal  mobility   Skin:   no rash, no itching, no skin infections, no pressure sores or ulcerations  Psych:   no anxiety, no depression, no nervousness, no unusual recent stress  Eyes:   no blurry vision, no floaters, no recent vision changes,  wears glasses or contacts  ENT:   has hearing loss and wears hearing aids, no loose or painful teeth, no dentures, last saw dentist every six months.  Hematologic:  no easy bruising, no abnormal bleeding, no clotting disorder, no frequent epistaxis  Endocrine:  no diabetes, does not check CBG's at home           Physical Exam:   BP 129/77 (BP Location: Left Arm, Patient Position: Sitting, Cuff Size: Large)   Pulse 75   Resp 16   Ht 5\' 9"  (1.753 m)   Wt 160 lb (72.6 kg)   SpO2 96% Comment: ON RA  BMI 23.63 kg/m   General:  Elderly but  well-appearing  HEENT:  Unremarkable, NCAT, PERLA, EOMI, oropharynx clear  Neck:   no JVD, no bruits, no adenopathy or thyromegaly  Chest:   clear to auscultation, symmetrical breath sounds, no wheezes, no rhonchi   CV:   RRR, grade II/VI crescendo/decrescendo murmur heard best at RSB to apex, no no diastolic murmur  Abdomen:  soft, non-tender, no masses or organomegaly  Extremities:  warm, well-perfused, pulses palpable, mild LE edema, marked right lower leg varicose veins, few on the left.  Rectal/GU  Deferred  Neuro:   Grossly non-focal and symmetrical throughout  Skin:   Clean and dry, no rashes, no breakdown   Diagnostic Tests:       [facility]            [address], [place] [address]              [phone]  ------------------------------------------------------------------- Transthoracic  Echocardiography  Patient:  Garek, Schuneman MR #:    [id number] Study Date: 11/02/2014 Gender:   M Age:    15 Height:   182.9 cm Weight:   79.8 kg BSA:    2.02 m^2 Pt. Status: Room:  ORDERING   [facility] REFERRING  [facility] SONOGRAPHER [place], [place] ATTENDING  Ena Dawley, M.D. PERFORMING  [facility]  cc:  ------------------------------------------------------------------- LV EF: 40% -  45%  ------------------------------------------------------------------- Indications:   (I35.8).  ------------------------------------------------------------------- History:  PMH: LBBB. PVCs. Mild AS. Acquired from the patient and from the patient&'s chart. Murmur. Aortic valve disease. Risk factors: Former tobacco use. Dyslipidemia.  ------------------------------------------------------------------- Study Conclusions  - Left ventricle: The cavity size was normal. There was mild focal basal hypertrophy of the septum. Systolic function was mildly to moderately reduced. The estimated ejection fraction was in the range of 40% to 45%. Diffuse hypokinesis and paradoxical septal motion consistent with LBBB. Doppler parameters are consistent with abnormal left ventricular relaxation (grade 1 diastolic dysfunction). - Ventricular septum: Septal motion showed paradox. - Aortic valve: Valve mobility was restricted. There was moderate stenosis. There was trivial regurgitation. Mean gradient (S): 24 mm Hg. - Mitral valve: Calcified annulus. Mildly thickened leaflets . There was mild regurgitation. - Left atrium: The atrium was mildly dilated. - Right ventricle: Systolic function was normal. - Right atrium: The atrium was normal in size. - Tricuspid valve: There was mild regurgitation. - Pulmonic valve: There was mild regurgitation. - Pulmonary arteries: Systolic pressure was within the normal range. PA  peak pressure: 32 mm Hg (S). - Inferior vena cava: The vessel was normal in size. - Pericardium, extracardiac: There was no pericardial effusion.  Impressions:  - Compared to the prior study from 2013 the aortic stenosis is now moderate.  Transthoracic echocardiography. M-mode, complete 2D, spectral Doppler, and color Doppler. Birthdate: Patient birthdate: [birth year]. Age: Patient is 83 yr old. Sex: Gender: male. BMI: 23.9 kg/m^2. Blood pressure:   124/66 Patient status: Outpatient. Study date: Study date: 11/02/2014. Study time: 09:52 AM. Location: [facility]  -------------------------------------------------------------------  ------------------------------------------------------------------- Left ventricle: The cavity size was normal. There was mild focal basal hypertrophy of the septum. Systolic function was mildly to moderately reduced. The estimated ejection fraction was in the range of 40% to 45%. Diffuse hypokinesis and paradoxical septal motion consistent with LBBB. Doppler parameters are consistent with abnormal left ventricular relaxation (grade 1 diastolic dysfunction). There was no evidence of elevated ventricular filling pressure by Doppler parameters.  ------------------------------------------------------------------- Aortic valve:  Trileaflet; moderately thickened, moderately calcified leaflets. Valve mobility was restricted. Doppler: There was moderate stenosis.  There was trivial regurgitation. VTI ratio of LVOT to aortic valve: 0.24. Valve area (VTI): 1.08 cm^2. Indexed valve area (VTI): 0.54 cm^2/m^2. Mean velocity ratio of LVOT to aortic valve: 0.24. Valve area (Vmean): 1.1 cm^2. Indexed valve area (Vmean): 0.54 cm^2/m^2.  Mean gradient (S): 24 mm Hg.  ------------------------------------------------------------------- Aorta: Aortic root: The aortic root was normal in  size.  ------------------------------------------------------------------- Mitral valve:  Calcified annulus. Mildly thickened leaflets . Mobility was not restricted. Doppler: Transvalvular velocity was within the normal range. There was no evidence for stenosis. There was mild regurgitation.  ------------------------------------------------------------------- Left atrium: The atrium was mildly dilated.  ------------------------------------------------------------------- Right ventricle: The cavity size was normal. Wall thickness was normal. Systolic function was normal.  ------------------------------------------------------------------- Ventricular septum:  Septal motion showed paradox.  ------------------------------------------------------------------- Pulmonic valve:  Structurally normal valve.  Cusp separation was normal. Doppler: Transvalvular velocity was within the normal range. There was no evidence for stenosis. There was mild regurgitation.  ------------------------------------------------------------------- Tricuspid valve:  Structurally normal valve.  Doppler: Transvalvular velocity was within the normal range. There was mild regurgitation.  ------------------------------------------------------------------- Pulmonary artery:  The main pulmonary artery was normal-sized. Systolic pressure was within the normal range.  ------------------------------------------------------------------- Right atrium: The atrium was normal in size.  ------------------------------------------------------------------- Pericardium: There was no pericardial effusion.  ------------------------------------------------------------------- Systemic veins: Inferior vena cava: The vessel was normal in size.  ------------------------------------------------------------------- Measurements  Left ventricle              Value     Reference LV ID, ED, PLAX  chordal          48.8 mm    43 - 52 LV ID, ES, PLAX chordal      (H)   39.2 mm    23 - 38 LV fx shortening, PLAX chordal  (L)   20  %    >=29 LV PW thickness, ED            10.2 mm    --------- IVS/LV PW ratio, ED            1.12      <=1.3 Stroke volume, 2D             84  ml    --------- Stroke volume/bsa, 2D           42  ml/m^2  --------- LV e&', lateral  9.36 cm/s   --------- LV E/e&', lateral             5.43      --------- LV e&', medial               5.95 cm/s   --------- LV E/e&', medial              8.54      --------- LV e&', average              7.66 cm/s   --------- LV E/e&', average             6.64      ---------  Ventricular septum            Value     Reference IVS thickness, ED             11.4 mm    ---------  LVOT                   Value     Reference LVOT ID, S                24  mm    --------- LVOT area                 4.52 cm^2   --------- LVOT ID                  24  mm    --------- LVOT peak velocity, S           83.5 cm/s   --------- LVOT mean velocity, S           54.3 cm/s   --------- LVOT VTI, S                18.5 cm    --------- Stroke volume (SV), LVOT DP        83.7 ml    --------- Stroke index (SV/bsa), LVOT DP      41.5 ml/m^2  ---------  Aortic valve               Value     Reference Aortic valve mean velocity, S       224  cm/s   --------- Aortic valve VTI, S            77.5 cm    --------- Aortic mean gradient, S          24  mm Hg  --------- VTI ratio, LVOT/AV             0.24      --------- Aortic valve area, VTI          1.08 cm^2   --------- Aortic valve area/bsa, VTI        0.54 cm^2/m^2 --------- Velocity ratio, mean, LVOT/AV       0.24      --------- Aortic valve area, mean velocity     1.1  cm^2   --------- Aortic valve area/bsa, mean        0.54 cm^2/m^2 --------- velocity  Aorta                   Value     Reference Aortic root ID, ED            33  mm    --------- Ascending aorta ID, A-P, S        35  mm    ---------  Left  atrium                Value     Reference LA ID, A-P, ES              42  mm    --------- LA ID/bsa, A-P              2.08 cm/m^2  <=2.2 LA volume, S               86  ml    --------- LA volume/bsa, S             42.6 ml/m^2  --------- LA volume, ES, 1-p A4C          85  ml    --------- LA volume/bsa, ES, 1-p A4C        42.1 ml/m^2  --------- LA volume, ES, 1-p A2C          79  ml    --------- LA volume/bsa, ES, 1-p A2C        39.2 ml/m^2  ---------  Mitral valve               Value     Reference Mitral E-wave peak velocity        50.8 cm/s   --------- Mitral A-wave peak velocity        102  cm/s   --------- Mitral deceleration time     (H)   320  ms    150 - 230 Mitral E/A ratio, peak          0.5      ---------  Pulmonary arteries            Value     Reference PA pressure, S, DP        (H)   32  mm Hg  <=30  Tricuspid valve              Value     Reference Tricuspid regurg peak velocity      271  cm/s   --------- Tricuspid peak RV-RA gradient       29  mm Hg  ---------  Systemic veins               Value     Reference Estimated CVP               3   mm Hg  ---------  Right ventricle              Value     Reference RV pressure, S, DP        (H)   32  mm Hg  <=30 RV s&', lateral, S             15  cm/s   ---------  Legend: (L) and (H) mark values outside specified reference range.  ------------------------------------------------------------------- Prepared and Electronically Authenticated by  Ena Dawley, M.D. 2016-03-04T17:41:26   [facility]                        [phone], [place] [address]                            [phone]  ------------------------------------------------------------------- Stress Echocardiography  Patient:  Tonny, Isensee MR #:       [id number] Study Date: 11/03/2016 Gender:     M Age:        25 Height:     176.5 cm Weight:     77.7 kg BSA:        1.96 m^2 Pt. Status: Room:   ORDERING     Peter [name], M.D.  REFERRING    Peter [name], M.D.  SONOGRAPHER  [place], [place]  ATTENDING    Ena Dawley, M.D.  PERFORMING   [facility] Outpatient  cc:  -------------------------------------------------------------------  ------------------------------------------------------------------- Indications:      (I35.0).  ------------------------------------------------------------------- History:   PMH:  Aortic stenosis. LBBB.  Risk factors: Dyslipidemia.  ------------------------------------------------------------------- Study Conclusions  - Stress ECG conclusions: There were no stress arrhythmias or   conduction abnormalities. The stress ECG was negative for   ischemia. - Staged echo: There was no echocardiographic evidence for   stress-induced ischemia. - Impressions: Dobutamine Echo for low gradient AS:     Baseline EF moderately decreased did not have complete study to   assess  RWMAls but EF appeared 30-35% with diffuse hypokinesis and   abnormal   septal motion from LBBB.     Aortic valve severely calcified with restricted leaflet motion.     Baseline: CO: 5.1 L/min Peak Velocity 3.2 m/sec Mean gradient 23   mmHg Peak gradient 41 mmHg AVA 1.05 cm2   Peak: CO: 10/4 L/min Peak Velocity 4.46 m/sec Mean gradient 48   mmHg Peak gradient 79 mmHg AVA 1.5 cm2     Study would indicate moderate to severe AS. Diagnostic study as   there is good LV functional reserve with CO going up by   over 100% with dobutamine. Gradients increased but AVA also went   up with inotropic reserve. AVA at rest and peak over 1 cm2.  Impressions:  - Dobutamine Echo for low gradient AS:     Baseline EF moderately decreased did not have complete study to   assess RWMAls but EF appeared 30-35% with diffuse hypokinesis and   abnormal   septal motion from LBBB.     Aortic valve severely calcified with restricted leaflet motion.     Baseline: CO: 5.1 L/min Peak Velocity 3.2 m/sec Mean gradient 23   mmHg Peak gradient 41 mmHg AVA 1.05 cm2   Peak: CO: 10/4 L/min Peak Velocity 4.46 m/sec Mean gradient 48   mmHg Peak gradient 79 mmHg AVA 1.5 cm2     Study would indicate moderate to severe AS. Diagnostic study as   there is good LV functional reserve with CO going up by   over 100% with dobutamine. Gradients increased but AVA also went   up with inotropic reserve. AVA at rest and peak over 1 cm2.  ------------------------------------------------------------------- Study data:   Study status:  Routine.  Consent:  The risks, benefits, and alternatives to the procedure were explained to the patient and informed consent was obtained.  Procedure:  The patient reported no pain pre or post test. Initial setup. The patient was brought to the laboratory. A baseline ECG was recorded. Surface ECG leads and automatic cuff blood pressure measurements were monitored.  Dobutamine stress test. Stress  testing was performed, with dobutamine infusion from 5 to 20 mcg/kg/min by 5 mcg/kg/min increments. The infusion was terminated due to maximal dose administration. Transthoracic stress echocardiography for assessment of valvular function. Images were captured at baseline, low dose, peak dose, and recovery.  Study completion:  The patient  tolerated the procedure well. There were no complications. Dobutamine. Stress echocardiography.  2D.  Birthdate:  Patient birthdate: [birth year].  Age:  Patient is 83 yr old.  Sex:  Gender: male.    BMI: 24.9 kg/m^2.  Blood pressure:     116/77  Patient status:  Outpatient.  Study date:  Study date: 11/03/2016. Study time: 03:03 PM.  -------------------------------------------------------------------  ------------------------------------------------------------------- Aortic valve:   Doppler:     VTI ratio of LVOT to aortic valve: 0.3. Valve area (VTI): 1.37 cm^2. Indexed valve area (VTI): 0.7 cm^2/m^2. Peak velocity ratio of LVOT to aortic valve: 0.3. Valve area (Vmax): 1.35 cm^2. Indexed valve area (Vmax): 0.69 cm^2/m^2. Mean velocity ratio of LVOT to aortic valve: 0.3. Valve area (Vmean): 1.36 cm^2. Indexed valve area (Vmean): 0.69 cm^2/m^2. Mean gradient (S): 25 mm Hg. Peak gradient (S): 45 mm Hg.   ------------------------------------------------------------------- Baseline ECG:  Normal.  ------------------------------------------------------------------- Stress protocol:  +--------------------+--+-----------+--------------------+--------+ !Stage               !HR!BP (mmHg)  !Rhythm              !Symptoms! +--------------------+--+-----------+--------------------+--------+ !Baseline            !64!116/77 (90)!--------------------!None    ! +--------------------+--+-----------+--------------------+--------+ !Dobutamine 5        !68!123/79 (94)!--------------------!--------! !ug/kg/min           !  !           !                    !         ! +--------------------+--+-----------+--------------------+--------+ !Dobutamine 10       !68!137/72 (94)!--------------------!--------! !ug/kg/min           !  !           !                    !        ! +--------------------+--+-----------+--------------------+--------+ !Dobutamine 20       !77!144/70 (95)!--------------------!--------! !ug/kg/min           !  !           !                    !        ! +--------------------+--+-----------+--------------------+--------+ !Immediate post      !75!138/66 (90)!--------------------!--------! !stress              !  !           !                    !        ! +--------------------+--+-----------+--------------------+--------+ !Recovery; 1 min     !78!-----------!--------------------!--------! +--------------------+--+-----------+--------------------+--------+ !Recovery; 2 min     !90!147/68 (94)!Occasional PAC&'s,   !--------! !                    !  !           !ventricular couplets!        ! +--------------------+--+-----------+--------------------+--------+ !Recovery; 3 min     !71!-----------!--------------------!--------! +--------------------+--+-----------+--------------------+--------+ !Recovery; 4 min     !69!151/70 (97)!--------------------!--------! +--------------------+--+-----------+--------------------+--------+ !Recovery; 5 min     !68!-----------!--------------------!--------! +--------------------+--+-----------+--------------------+--------+ !Recovery; 6 min     !68!152/71 (98)!--------------------!--------! +--------------------+--+-----------+--------------------+--------+  ------------------------------------------------------------------- Stress results:   Maximal heart rate during stress was 90 bpm (68% of maximal predicted heart rate). The maximal predicted heart  rate was 133 bpm.The target heart rate was achieved. The heart rate response to stress was normal. There was a normal resting blood pressure with an  appropriate response to stress. Normal blood pressure response to dobutamine. The rate-pressure product for the peak heart rate and blood pressure was 13230 mm Hg/min.  The patient experienced no chest pain during stress.  ------------------------------------------------------------------- Stress ECG:  There were no stress arrhythmias or conduction abnormalities.  The stress ECG was negative for ischemia.  ------------------------------------------------------------------- Baseline:  Low dose: Peak stress: Recovery:  ------------------------------------------------------------------- Stress echo results:     Left ventricular ejection fraction was normal at rest and with stress. There was no echocardiographic evidence for stress-induced ischemia.  ------------------------------------------------------------------- Measurements   Left ventricle                           Value  Stroke volume, 2D                        106   ml  Stroke volume/bsa, 2D                    54    ml/m^2    LVOT                                     Value  LVOT ID, S                               24    mm  LVOT area                                4.52  cm^2  LVOT peak velocity, S                    101   cm/s  LVOT mean velocity, S                    68.5  cm/s  LVOT VTI, S                              23.4  cm  LVOT peak gradient, S                    4     mm Hg    Aortic valve                             Value  Aortic valve peak velocity, S            337   cm/s  Aortic valve mean velocity, S            228   cm/s  Aortic valve VTI, S                      77.2  cm  Aortic mean gradient, S                  25    mm Hg  Aortic peak gradient, S  45    mm Hg  VTI ratio, LVOT/AV                       0.3  Aortic valve area, VTI                   1.37  cm^2  Aortic valve area/bsa, VTI               0.7   cm^2/m^2  Velocity ratio, peak, LVOT/AV            0.3  Aortic valve area,  peak velocity         1.35  cm^2  Aortic valve area/bsa, peak velocity     0.69  cm^2/m^2  Velocity ratio, mean, LVOT/AV            0.3  Aortic valve area, mean velocity         1.36  cm^2  Aortic valve area/bsa, mean velocity     0.69  cm^2/m^2  Legend: (L)  and  (H)  mark values outside specified reference range.  ------------------------------------------------------------------- Prepared and Electronically Authenticated by  Jenkins Rouge, M.D. 2018-03-07T12:50:17  Donetta Potts  Cardiac catheterization  Order# [id number]  Reading physician: [name] [name], MD Ordering physician: [name] [name], MD Study date: 11/13/16  Physicians   Panel Physicians Referring Physician Case Authorizing Physician  [name] [name], MD (Primary)    Procedures   Right/Left Heart Cath and Coronary Angiography  Conclusion     Ost 1st Mrg to 1st Mrg lesion, 50 %stenosed.  There is moderate to severe left ventricular systolic dysfunction.  LV end diastolic pressure is normal.  The left ventricular ejection fraction is 25-35% by visual estimate.  LV end diastolic pressure is normal.  There is moderate aortic valve stenosis.   1. Mild nonobstructive CAD 2. Normal right heart pressures 3. Normal LV filling pressures  4. Moderate Aortic stenosis with mean gradient of 26 mm Hg. AV area may be underestimated due to low EF.   Plan: consider AVR/TAVR.    Indications   Nonrheumatic aortic valve stenosis [I35.0 (ICD-10-CM)]  Procedural Details/Technique   Technical Details Indication: 83 yo WM with aortic stenosis. Echo shows worsening of EF to 30%. Dobutamine Echo consistent with moderate to severe low gradient AS.  Procedural Details: The right wrist was prepped, draped, and anesthetized with 1% lidocaine. Using the modified Seldinger technique a 6 Fr slender sheath was placed in the right radial artery and a 5 French sheath was placed in the right brachial vein. A Swan-Ganz catheter  was used for the right heart catheterization. Standard protocol was followed for recording of right heart pressures and sampling of oxygen saturations. Fick cardiac output was calculated. Standard Judkins catheters were used for selective coronary angiography and left ventriculography. There were no immediate procedural complications. The patient was transferred to the post catheterization recovery area for further monitoring.  Contrast: 90 cc   Estimated blood loss <50 mL.  During this procedure the patient was administered the following to achieve and maintain moderate conscious sedation: Versed 1 mg, Fentanyl 25 mcg, while the patient's heart rate, blood pressure, and oxygen saturation were continuously monitored. The period of conscious sedation was 36 minutes, of which I was present face-to-face 100% of this time.    Complications   Complications documented before study signed (11/13/2016 9:37 AM EDT)    No complications were associated with this study.  Documented by Ander Slade  [name], MD - 11/13/2016 9:33 AM EDT    Coronary Findings   Dominance: Right  Left Main  Vessel was injected. Vessel is normal in caliber. Vessel is angiographically normal.  Left Anterior Descending  Vessel was injected. Vessel is normal in caliber. Vessel is angiographically normal.  Ramus Intermedius  Vessel was injected. Vessel is normal in caliber. Vessel is angiographically normal.  Left Circumflex  First Obtuse Marginal Branch  Ost 1st Mrg to 1st Mrg lesion, 50% stenosed.  Right Coronary Artery  Vessel was injected. Vessel is normal in caliber. Vessel is angiographically normal.  Right Heart   Right Heart Pressures LV EDP is normal. Normal right heart pressures    Wall Motion              Left Heart   Left Ventricle The left ventricular size is normal. There is moderate to severe left ventricular systolic dysfunction. LV end diastolic pressure is normal. The left ventricular ejection fraction  is 25-35% by visual estimate. There are LV function abnormalities due to global hypokinesis.    Aortic Valve There is moderate aortic valve stenosis. The aortic valve is calcified. There is restricted aortic valve motion.    Coronary Diagrams   Diagnostic Diagram       Implants     No implant documentation for this case.  PACS Images   Show images for Cardiac catheterization   Link to Procedure Log   Procedure Log    Hemo Data    Most Recent Value  Fick Cardiac Output 7.44 L/min  Fick Cardiac Output Index 3.96 (L/min)/BSA  Aortic Mean Gradient 26.6 mmHg  Aortic Peak Gradient 49 mmHg  Aortic Valve Area 1.52  Aortic Value Area Index 0.81 cm2/BSA  RA A Wave 2 mmHg  RA V Wave 1 mmHg  RA Mean 0 mmHg  RV Systolic Pressure 23 mmHg  RV Diastolic Pressure -1 mmHg  RV EDP 2 mmHg  PA Systolic Pressure 20 mmHg  PA Diastolic Pressure 1 mmHg  PA Mean 8 mmHg  PW A Wave 6 mmHg  PW V Wave 9 mmHg  PW Mean 4 mmHg  AO Systolic Pressure 601 mmHg  AO Diastolic Pressure 61 mmHg  AO Mean 79 mmHg  LV Systolic Pressure 093 mmHg  LV Diastolic Pressure 1 mmHg  LV EDP 1 mmHg  Arterial Occlusion Pressure Extended Systolic Pressure 87 mmHg  Arterial Occlusion Pressure Extended Diastolic Pressure 56 mmHg  Arterial Occlusion Pressure Extended Mean Pressure 72 mmHg  Left Ventricular Apex Extended Systolic Pressure 235 mmHg  Left Ventricular Apex Extended Diastolic Pressure 2 mmHg  Left Ventricular Apex Extended EDP Pressure 5 mmHg  QP/QS 1  TPVR Index 2.02 HRUI  TSVR Index 19.95 HRUI  TPVR/TSVR Ratio 0.1   RISK SCORES About the STS Risk Calculator Procedure: AV Replacement + CAB  Risk of Mortality: 4.206%  Morbidity or Mortality: 24.395%  Long Length of Stay: 12.839%  Short Length of Stay: 17.139%  Permanent Stroke: 1.809%  Prolonged Ventilation: 13.967%  DSW Infection: 0.25%  Renal Failure: 6.861%  Reoperation: 10.453%    Impression:  This 83 year old gentleman has stage D  severe symptomatic low gradient, low EF aortic stenosis with NYHA class II symptoms of dyspnea and fatigue with mild moderate exertion. I have personally reviewed his echo, cath and dobutamine stress echo. He has a trileaflet valve with marked calcification of all three leaflets and restricted mobility. The valve looks severely stenotic but the mean gradient has remained within the moderate  range as the LVEF has progressively deteriorated. His dobutamine stress echo shows significant contractile reserve with a doubling of cardiac output and a rise in the mean gradient to 48 mm Hg at peak dobutamine suggesting that he is likely to have an improvement of LV function with AVR. He has non-obstructive CAD with a 50% OM stenosis. I think AVR is indicated to prevent further LV deterioration and progression of heart failure symptoms. His risk for open surgical AVR would be at least moderately increased due to his advanced age and poor EF. I think TAVR would be the best option for him.   The patient and his wife, son, and daughter were counseled at length regarding treatment alternatives for management of severe symptomatic aortic stenosis. The risks and benefits of surgical intervention has been discussed in detail. Long-term prognosis with medical therapy was discussed. Alternative approaches such as conventional surgical aortic valve replacement, transcatheter aortic valve replacement, and palliative medical therapy were compared and contrasted at length. This discussion was placed in the context of the patient's own specific clinical presentation and past medical history. All of their questions been addressed. The patient is eager to proceed with further evaluation for TAVR.  The patient and his family have been advised of a variety of complications that might develop including but not limited to risks of death, stroke, paravalvular leak, aortic dissection or other major vascular complications, aortic annulus  rupture, device embolization, cardiac rupture or perforation, mitral regurgitation, acute myocardial infarction, arrhythmia, heart block or bradycardia requiring permanent pacemaker placement, congestive heart failure, respiratory failure, renal failure, pneumonia, infection, other late complications related to structural valve deterioration or migration, or other complications that might ultimately cause a temporary or permanent loss of functional independence or other long term morbidity. The patient provides full informed consent for the procedure as described and all questions were answered.     Plan:  1. Schedule gated cardiac CT.  2. Schedule CTA of the chest, abdomen, and pelvis.  3.  After these have been completed he will need a second surgical evaluation and evaluation by one of the interventional cardiologists on the structural heart valve team.  4. PT evaluation, PFT's.   I spent 60 minutes performing this consultation and > 50% of this time was spent face to face counseling and coordinating the care of this patient's severe aortic stenosis.    Gaye Pollack, MD 11/19/2016

## 2016-11-23 ENCOUNTER — Other Ambulatory Visit: Payer: Self-pay | Admitting: *Deleted

## 2016-11-23 DIAGNOSIS — I350 Nonrheumatic aortic (valve) stenosis: Secondary | ICD-10-CM

## 2016-11-24 ENCOUNTER — Other Ambulatory Visit: Payer: Self-pay | Admitting: *Deleted

## 2016-11-24 DIAGNOSIS — I350 Nonrheumatic aortic (valve) stenosis: Secondary | ICD-10-CM

## 2016-11-27 ENCOUNTER — Encounter: Payer: Self-pay | Admitting: Cardiovascular Disease

## 2016-11-27 ENCOUNTER — Ambulatory Visit (INDEPENDENT_AMBULATORY_CARE_PROVIDER_SITE_OTHER): Payer: Medicare Other | Admitting: Cardiovascular Disease

## 2016-11-27 VITALS — BP 102/62 | HR 77 | Ht 69.0 in | Wt 162.0 lb

## 2016-11-27 DIAGNOSIS — I350 Nonrheumatic aortic (valve) stenosis: Secondary | ICD-10-CM

## 2016-11-27 NOTE — Progress Notes (Signed)
TAVR Clinic Consult Note  Chief Complaint  Patient presents with  . New Patient (Initial Visit)    TAVR consult   History of Present Illness: 83 yo male with history of HLD, LBBB, prostate cancer, cardiomyopathy, CAD and aortic stenosis who is here today as a new consult in the valve clinic to discuss his aortic stenosis. He has been followed in our office by Dr. [name] and has had slow progression of his aortic valve disease over the past few years. Echo in February 2018 showed LVEF=25-30% with heavily calcified aortic valve, mean gradient 20 mmHg. Dobutamine stress echo with rise in mean gradient to 48 mmHg. Right and left heart cath 11/13/16 with mild non-obstructive CAD with normal filling pressures and mean gradient of 26 mm Hg across the aortic valve. He has described dyspnea with minimal exertion and dizziness without syncope. No chest pain. Overall lack of energy. He has seen Dr. Cyndia Bent and planning for TAVR is underway. He is seeing me today to discuss TAVR.   He reports fatigue with lack of energy. He has dyspnea with minimal exertion. No dizziness, near syncope or syncope. No LE edema. No chest pain.   Primary Care Physician: [name], MD  Primary Cardiology: [name] Referring: [name] Reason for consultation: Severe AS  Past Medical History:  Diagnosis Date  . Arthritis   . Atypical chest pain    history of   . Back pain   . Cardiovascular stress test abnormal 04/25/2004   Abnormal adenosine Cardiolite study / evidence of inferoseptal ischemia &  an EF of 48% ""recommended that he undergo cardiac catheterization""  . Cervical spine disease   . Dizziness    occasionally apon bending over  . Fatigue    history of   . History of mononucleosis   . Hypercholesterolemia   . Left bundle branch block   . Pleurisy 07/2005  . S/P radiation therapy  12/05/2012 through 01/30/2013                                                         Prostate 7800 cGy 40 sessions, seminal  vesicles 5600 cGy 40 sessions                       . Shortness of breath   . Spondylosis, cervical    at C3-C4  . Varicose veins   . Weakness    history of     Past Surgical History:  Procedure Laterality Date  . BACK SURGERY  1995    2 ruptured discs in lower back / by Dr. Louanne Skye  . CARDIAC CATHETERIZATION  2005   EF estimated at 50% /  PROCEDURE:  Left heart catheterization, coronary and left ventricular angiography /  RAO view demonstrates mild LV enlargement  / mild hypokinesia anteroseptal with  overall low normal LV systolic function / Normal coronary anatomy  . HEMORRHOID SURGERY    . PROSTATE BIOPSY  08/25/2012   Gleason 3+3=6 PSA 7.80  . RIGHT/LEFT HEART CATH AND CORONARY ANGIOGRAPHY N/A 11/13/2016   Procedure: Right/Left Heart Cath and Coronary Angiography;  Surgeon: [name] [name], MD;  Location: [facility];  Service: Cardiovascular;  Laterality: N/A;  . TONSILLECTOMY AND ADENOIDECTOMY      Current Outpatient Prescriptions  Medication Sig Dispense  Refill  . aspirin EC 81 MG tablet Take 81 mg by mouth daily.    . simvastatin (ZOCOR) 10 MG tablet Take 5 mg by mouth daily at 6 PM.    . tamsulosin (FLOMAX) 0.4 MG CAPS capsule Take 0.4 mg by mouth daily. Daily during the evening     Current Facility-Administered Medications  Medication Dose Route Frequency Provider Last Rate Last Dose  . aspirin chewable tablet 81 mg  81 mg Oral Once [name] [name], MD        Allergies  Allergen Reactions  . Lipitor [Atorvastatin Calcium] Other (See Comments)    cramps    Social History   Social History  . Marital status: Married    Spouse name: N/A  . Number of children: 2  . 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 Smoking status: Former Smoker    Packs/day: 1.00    Years: 7.00    Types: Cigarettes    Quit date: 09/01/1951  . Smokeless tobacco: Never Used  . Alcohol use No  . Drug use: No   . Sexual activity: Not on file   Other Topics Concern  . Not on file   Social History Narrative  . No narrative on file    Family History  Problem Relation Age of Onset  . Stroke Father   . Cancer Father   . Heart failure Mother   . Heart disease Mother   . Multiple sclerosis Sister   . Lung cancer Brother     Review of Systems:  As stated in the HPI and otherwise negative.   BP 102/62   Pulse 77   Ht 5\' 9"  (1.753 m)   Wt 162 lb (73.5 kg)   SpO2 97%   BMI 23.92 kg/m   Physical Examination: General: Well developed, well nourished, NAD  HEENT: OP clear, mucus membranes moist  SKIN: warm, dry. No rashes. Neuro: No focal deficits  Musculoskeletal: Muscle strength 5/5 all ext  Psychiatric: Mood and affect normal  Neck: No JVD, no carotid bruits, no thyromegaly, no lymphadenopathy.  Lungs:Clear bilaterally, no wheezes, rhonci, crackles Cardiovascular: Regular rate and rhythm. harsh systolic murmur noted. No gallops or rubs. Abdomen:Soft. Bowel sounds present. Non-tender.  Extremities: No lower extremity edema. Pulses are 2 + in the bilateral DP/PT.  Echo February 2018: Left ventricle: The cavity size was normal. There was mild focal basal hypertrophy of the septum. Systolic function was mildly to moderately reduced. The estimated ejection fraction was in the range of 40% to 45%. Diffuse hypokinesis and paradoxical septal motion consistent with LBBB. Doppler parameters are consistent with abnormal left ventricular relaxation (grade 1 diastolic dysfunction). - Ventricular septum: Septal motion showed paradox. - Aortic valve: Valve mobility was restricted. There was moderate stenosis. There was trivial regurgitation. Mean gradient (S): 24 mm Hg. - Mitral valve: Calcified annulus. Mildly thickened leaflets . There was mild regurgitation. - Left atrium: The atrium was mildly dilated. - Right ventricle: Systolic function was normal. - Right atrium:  The atrium was normal in size. - Tricuspid valve: There was mild regurgitation. - Pulmonic valve: There was mild regurgitation. - Pulmonary arteries: Systolic pressure was within the normal range. PA peak pressure: 32 mm Hg (S). - Inferior vena cava: The vessel was normal in size. - Pericardium, extracardiac: There was no pericardial effusion.  Impressions:  - Compared to the prior study from 2013 the  aortic stenosis is now moderate.  Transthoracic echocardiography. M-mode, complete 2D, spectral Doppler, and color Doppler. Birthdate: Patient birthdate: 09/16/1927. Age: Patient is 83 yr old. Sex: Gender: male. BMI: 23.9 kg/m^2. Blood pressure:   124/66 Patient status: Outpatient. Study date: Study date: 11/02/2014. Study time: 09:52 AM. Location: [facility]  -------------------------------------------------------------------  ------------------------------------------------------------------- Left ventricle: The cavity size was normal. There was mild focal basal hypertrophy of the septum. Systolic function was mildly to moderately reduced. The estimated ejection fraction was in the range of 40% to 45%. Diffuse hypokinesis and paradoxical septal motion consistent with LBBB. Doppler parameters are consistent with abnormal left ventricular relaxation (grade 1 diastolic dysfunction). There was no evidence of elevated ventricular filling pressure by Doppler parameters.  ------------------------------------------------------------------- Aortic valve:  Trileaflet; moderately thickened, moderately calcified leaflets. Valve mobility was restricted. Doppler: There was moderate stenosis.  There was trivial regurgitation. VTI ratio of LVOT to aortic valve: 0.24. Valve area (VTI): 1.08 cm^2. Indexed valve area (VTI): 0.54 cm^2/m^2. Mean velocity ratio of LVOT to aortic valve: 0.24. Valve area (Vmean): 1.1 cm^2. Indexed valve area (Vmean): 0.54 cm^2/m^2.  Mean  gradient (S): 24 mm Hg.  Cardiac cath 11/13/16: Diagnostic Diagram          EKG:  EKG is not ordered today. EKG from 11/13/16 is reviewed by me today and shows NSR, LBBB   Recent Labs: 09/24/2016: ALT 9 11/09/2016: BUN 18; Creatinine, Ser 1.12; Hemoglobin 13.0; Platelets 246.0; Potassium 4.3; Sodium 139   Lipid Panel    Component Value Date/Time   CHOL 154 09/24/2016 0803   TRIG 55 09/24/2016 0803   HDL 58 09/24/2016 0803   CHOLHDL 2.7 09/24/2016 0803   VLDL 11 09/24/2016 0803   LDLCALC 85 09/24/2016 0803     Wt Readings from Last 3 Encounters:  11/27/16 162 lb (73.5 kg)  11/19/16 160 lb (72.6 kg)  11/13/16 160 lb (72.6 kg)     Other studies Reviewed: Additional studies/ records that were reviewed today include: . Review of the above records demonstrates:   Assessment and Plan:   1. Severe aortic stenosis: He has stage D symptomatic aortic valve stenosis. I have personally reviewed the echo images. The aortic valve is thickened, calcified with limited leaflet mobility. I think he would benefit from AVR. Given advanced age, he is not a good candidate for conventional AVR by surgical approach. I think he may be a good candidate for TAVR. He is a very functional 83 yo patient. I have reviewed the TAVR procedure in detail today with the patient and his family. He would like to proceed with TAVR. He has completed his cardiac cath. He has seen Dr. Cyndia Bent. He will have CT scans next week. He will get the second surgical consult from Dr. Servando Snare next week. Will plan TAVR on December 08, 2016. Risks and benefits of procedure reviewed with the patient and his family.   Current medicines are reviewed at length with the patient today.  The patient does not have concerns regarding medicines.  The following changes have been made:  no change  Labs/ tests ordered today include:  No orders of the defined types were placed in this encounter.    Disposition:   FU with me after his  procedure.    Signed, Lauree Chandler, MD 11/27/2016 1:40 PM    [facility] [address], [place], [place]  [address] Phone: [phone]; Fax: [phone]

## 2016-11-30 ENCOUNTER — Ambulatory Visit (HOSPITAL_COMMUNITY)
Admission: RE | Admit: 2016-11-30 | Discharge: 2016-11-30 | Disposition: A | Discharge status: Home / Self Care | Payer: Medicare Other | Source: Ambulatory Visit | Attending: Surgery | Admitting: Surgery

## 2016-11-30 ENCOUNTER — Ambulatory Visit (HOSPITAL_COMMUNITY): Admission: RE | Admit: 2016-11-30 | Payer: Medicare Other | Source: Ambulatory Visit

## 2016-11-30 ENCOUNTER — Encounter (HOSPITAL_COMMUNITY): Payer: Self-pay

## 2016-11-30 ENCOUNTER — Ambulatory Visit (HOSPITAL_BASED_OUTPATIENT_CLINIC_OR_DEPARTMENT_OTHER)
Admission: RE | Admit: 2016-11-30 | Discharge: 2016-11-30 | Disposition: A | Discharge status: Home / Self Care | Payer: Medicare Other | Source: Ambulatory Visit | Attending: Surgery | Admitting: Surgery

## 2016-11-30 ENCOUNTER — Ambulatory Visit: Payer: Medicare Other | Admitting: Physical Therapy

## 2016-11-30 DIAGNOSIS — I350 Nonrheumatic aortic (valve) stenosis: Secondary | ICD-10-CM | POA: Diagnosis not present

## 2016-11-30 DIAGNOSIS — R918 Other nonspecific abnormal finding of lung field: Secondary | ICD-10-CM | POA: Diagnosis not present

## 2016-11-30 DIAGNOSIS — K5730 Diverticulosis of large intestine without perforation or abscess without bleeding: Secondary | ICD-10-CM | POA: Diagnosis not present

## 2016-11-30 DIAGNOSIS — I6521 Occlusion and stenosis of right carotid artery: Secondary | ICD-10-CM | POA: Insufficient documentation

## 2016-11-30 LAB — PULMONARY FUNCTION TEST
DL/VA % PRED: 68 %
DL/VA: 3.14 ml/min/mmHg/L
DLCO unc % pred: 71 %
DLCO unc: 23.08 ml/min/mmHg
FEF 25-75 POST: 1.83 L/s
FEF 25-75 Pre: 2.6 L/sec
FEF2575-%CHANGE-POST: -29 %
FEF2575-%Pred-Post: 113 %
FEF2575-%Pred-Pre: 161 %
FEV1-%CHANGE-POST: -21 %
FEV1-%PRED-PRE: 129 %
FEV1-%Pred-Post: 101 %
FEV1-Post: 2.6 L
FEV1-Pre: 3.32 L
FEV1FVC-%CHANGE-POST: -20 %
FEV1FVC-%PRED-PRE: 95 %
FEV6-%Change-Post: 2 %
FEV6-%PRED-PRE: 137 %
FEV6-%Pred-Post: 140 %
FEV6-POST: 4.83 L
FEV6-Pre: 4.73 L
FEV6FVC-%Change-Post: 0 %
FEV6FVC-%PRED-POST: 107 %
FEV6FVC-%Pred-Pre: 108 %
FVC-%CHANGE-POST: -1 %
FVC-%PRED-PRE: 134 %
FVC-%Pred-Post: 131 %
FVC-POST: 4.89 L
FVC-PRE: 4.98 L
POST FEV6/FVC RATIO: 99 %
PRE FEV6/FVC RATIO: 99 %
Post FEV1/FVC ratio: 53 %
Pre FEV1/FVC ratio: 67 %
RV % pred: 102 %
RV: 2.88 L
TLC % PRED: 112 %
TLC: 7.99 L

## 2016-11-30 MED ORDER — METOPROLOL TARTRATE 5 MG/5ML IV SOLN
5.0000 mg | Freq: Once | INTRAVENOUS | Status: AC
  Administered 2016-11-30: 5 mg via INTRAVENOUS
  Filled 2016-11-30: qty 5

## 2016-11-30 MED ORDER — IOPAMIDOL (ISOVUE-370) INJECTION 76%
INTRAVENOUS | Status: AC
  Administered 2016-11-30: 100 mL
  Filled 2016-11-30: qty 50

## 2016-11-30 MED ORDER — IOPAMIDOL (ISOVUE-370) INJECTION 76%
INTRAVENOUS | Status: AC
  Administered 2016-11-30: 50 mL
  Filled 2016-11-30: qty 100

## 2016-11-30 MED ORDER — ALBUTEROL SULFATE (2.5 MG/3ML) 0.083% IN NEBU
2.5000 mg | INHALATION_SOLUTION | Freq: Once | RESPIRATORY_TRACT | Status: AC
  Administered 2016-11-30: 2.5 mg via RESPIRATORY_TRACT

## 2016-11-30 MED ORDER — METOPROLOL TARTRATE 5 MG/5ML IV SOLN
INTRAVENOUS | Status: AC
  Administered 2016-11-30: 5 mg via INTRAVENOUS
  Filled 2016-11-30: qty 5

## 2016-11-30 NOTE — Progress Notes (Signed)
VASCULAR LAB PRELIMINARY  PRELIMINARY  PRELIMINARY  PRELIMINARY  Carotid duplex completed.    Preliminary report:  Bilateral:  1-39% ICA stenosis lower end of scale..  Vertebral artery flow is antegrade.     Marlow Hendrie, RVS 11/30/2016, 1:26 PM

## 2016-12-01 ENCOUNTER — Ambulatory Visit: Payer: Medicare Other | Attending: Surgery | Admitting: Physical Therapy

## 2016-12-01 ENCOUNTER — Encounter: Payer: Self-pay | Admitting: Physical Therapy

## 2016-12-01 DIAGNOSIS — R293 Abnormal posture: Secondary | ICD-10-CM | POA: Insufficient documentation

## 2016-12-01 DIAGNOSIS — R2689 Other abnormalities of gait and mobility: Secondary | ICD-10-CM | POA: Diagnosis not present

## 2016-12-01 LAB — VAS US CAROTID
LEFT ECA DIAS: -18 cm/s
LEFT VERTEBRAL DIAS: -12 cm/s
LICAPDIAS: -28 cm/s
Left CCA dist dias: -17 cm/s
Left CCA dist sys: -63 cm/s
Left CCA prox dias: 19 cm/s
Left CCA prox sys: 71 cm/s
Left ICA dist dias: -25 cm/s
Left ICA dist sys: -68 cm/s
Left ICA prox sys: -69 cm/s
RCCADSYS: -65 cm/s
RCCAPDIAS: 21 cm/s
RCCAPSYS: 80 cm/s
RIGHT ECA DIAS: -14 cm/s
RIGHT VERTEBRAL DIAS: -19 cm/s

## 2016-12-01 NOTE — Therapy (Signed)
[place], [place], [address] Phone: [phone]   Fax:  [phone]  Physical Therapy Evaluation  Patient Details  Name: Jonathan Graham MRN: [id number] Date of Birth: 1927-09-04 Referring Provider: Dr. Gilford Raid  Encounter Date: 12/01/2016      PT End of Session - 12/01/16 1045    Visit Number 1   PT Start Time 0953   PT Stop Time 1030   PT Time Calculation (min) 37 min      Past Medical History:  Diagnosis Date  . Arthritis   . Atypical chest pain    history of   . Back pain   . Cardiovascular stress test abnormal 04/25/2004   Abnormal adenosine Cardiolite study / evidence of inferoseptal ischemia &  an EF of 48% ""recommended that he undergo cardiac catheterization""  . Cervical spine disease   . Dizziness    occasionally apon bending over  . Fatigue    history of   . History of mononucleosis   . Hypercholesterolemia   . Left bundle branch block   . Pleurisy 07/2005  . S/P radiation therapy  12/05/2012 through 01/30/2013                                                         Prostate 7800 cGy 40 sessions, seminal vesicles 5600 cGy 40 sessions                       . Shortness of breath   . Spondylosis, cervical    at C3-C4  . Varicose veins   . Weakness    history of     Past Surgical History:  Procedure Laterality Date  . BACK SURGERY  1995    2 ruptured discs in lower back / by Dr. Louanne Skye  . CARDIAC CATHETERIZATION  2005   EF estimated at 50% /  PROCEDURE:  Left heart catheterization, coronary and left ventricular angiography /  RAO view demonstrates mild LV enlargement  / mild hypokinesia anteroseptal with  overall low normal LV systolic function / Normal coronary anatomy  . HEMORRHOID SURGERY    . PROSTATE BIOPSY  08/25/2012   Gleason 3+3=6 PSA 7.80  . RIGHT/LEFT HEART CATH AND CORONARY ANGIOGRAPHY N/A 11/13/2016   Procedure: Right/Left Heart Cath and Coronary Angiography;   Surgeon: [name] [name], MD;  Location: [facility];  Service: Cardiovascular;  Laterality: N/A;  . TONSILLECTOMY AND ADENOIDECTOMY      There were no vitals filed for this visit.       Subjective Assessment - 12/01/16 0956    Subjective Pt's wife report a general increase in shortness of breath mainly noticable on stairs over the past year. Pt minimizes symtpoms. He also notices some occasional dizziness, no chest pain/pressure. Pt does report a general decline in energy/increased fatigue.    Patient Stated Goals to fix heart   Currently in Pain? No/denies            [facility] PT Assessment - 12/01/16 0001      Assessment   Medical Diagnosis severe aortic stenosis   Referring Provider Dr. Gilford Raid   Onset Date/Surgical Date 11/19/16   Hand Dominance Right     Precautions   Precautions None  Restrictions   Weight Bearing Restrictions No     Balance Screen   Has the patient fallen in the past 6 months No   Has the patient had a decrease in activity level because of a fear of falling?  No   Is the patient reluctant to leave their home because of a fear of falling?  No     Home Social worker Private residence   [place] to enter   Entrance Stairs-Number of Steps 1   [place];Able to live on main level with bedroom/bathroom  takes shower in basement   Alternate Level Stairs-Number of Steps 12   Alternate Level Stairs-Rails Right  going down to basement     Prior Function   Level of Independence Independent with community mobility without device     Posture/Postural Control   Posture/Postural Control Postural limitations   Postural Limitations Rounded Shoulders;Forward head  mild     ROM / Strength   AROM / PROM / Strength AROM;Strength     AROM   Overall AROM Comments Grossly WNL throughout     Strength   Overall Strength Comments grossly 4/5 throughout   Strength Assessment  Site Hand   Right/Left hand Right;Left   Right Hand Grip (lbs) 73  R hand dominant   Left Hand Grip (lbs) 64     Ambulation/Gait   Gait Comments Pt ambulates without significant deviations. Pt's systolic BP did increase significantly.           [facility] Pre-Surgical Assessment - 12/01/16 0001    5 Meter Walk Test- trial 1 4 sec   5 Meter Walk Test- trial 2 4 sec.    5 Meter Walk Test- trial 3 4 sec.   5 meter walk test average 4 sec   4 Stage Balance Test tolerated for:  10 sec.   4 Stage Balance Test Position 4   Sit To Stand Test- trial 1 12 sec.   ADL/IADL Independent with: Bathing;Dressing;Meal prep;Finances;Yard work   Banker Index --   6 Minute Walk- Baseline yes   BP (mmHg) 98/70   HR (bpm) 65   02 Sat (%RA) 99 %   Modified Borg Scale for Dyspnea 0- Nothing at all   Perceived Rate of Exertion (Borg) 6-   6 Minute Walk Post Test yes   BP (mmHg) 124/77   HR (bpm) 101   02 Sat (%RA) 97 %   Modified Borg Scale for Dyspnea 0- Nothing at all   Perceived Rate of Exertion (Borg) 7- Very, very light   Aerobic Endurance Distance Walked 1220   Endurance additional comments No rest breaks.                                      Plan - 12/01/16 1100    Clinical Impression Statement see below   PT Frequency One time visit   Consulted and Agree with Plan of Care Patient;Family member/caregiver     Clinical Impression Statement: Pt is a 83 yo male presenting to OP PT for evaluation prior to possible TAVR surgery due to severe aortic stenosis. Pt/wife report mild onset of shortness of breath and fatigue approximately 12 months ago. Pt presents with good ROM and strength, good balance and is not at high fall risk 4 stage balance test, good walking speed and fair aerobic endurance  per 6 minute walk test. Pt ambulated a total of 1220 feet in 6 minute walk. BP increased significantly with 6 minute walk test. Based on the Short Physical Performance  Battery, patient has a frailty rating of 11/12 with </= 5/12 considered frail.   Patient demonstrated the following deficits and impairments:     Visit Diagnosis: Other abnormalities of gait and mobility  Abnormal posture      G-Codes - 12/18/16 1100    Functional Assessment Tool Used (Outpatient Only) 6 minute walk 1220   Functional Limitation Mobility: Walking and moving around   Mobility: Walking and Moving Around Current Status [phone]) At least 1 percent but less than 20 percent impaired, limited or restricted   Mobility: Walking and Moving Around Goal Status [phone]) At least 1 percent but less than 20 percent impaired, limited or restricted   Mobility: Walking and Moving Around Discharge Status [phone]) At least 1 percent but less than 20 percent impaired, limited or restricted       Problem List Patient Active Problem List   Diagnosis Date Noted  . Chronic systolic CHF (congestive heart failure) ([facility] [facility]) 11/13/2016  . Severe aortic stenosis 07/11/2015  . Anemia 06/18/2013  . Prostate cancer ([place]) 10/19/2012  . Osteoarthritis of right knee 01/23/2011  . Varicose vein of leg 01/23/2011  . Hypercholesterolemia   . Left bundle branch block   . Dizziness     Lurlean Kernen, PT 12-18-16, 11:01 AM  [facility] [address] [place], [place], [address] Phone: [phone]   Fax:  [phone]  Name: Jonathan Graham MRN: [id number] Date of Birth: July 15, 1928

## 2016-12-01 NOTE — Pre-Procedure Instructions (Addendum)
Jonathan Graham  12/01/2016      [id number] [address] [place]  [address] Phone: [phone] Fax: [place], [place] [address] [place] [address] Phone: [phone] Fax: [phone]    Your procedure is scheduled on Tues. April 10  Report to [facility] Admitting at 8:00 A.M.  Call this number if you have problems the morning of surgery:  [phone]   Remember:  Do not eat food or drink liquids after midnight on Mon. April 9   Take these medicines the morning of surgery with A SIP OF WATER : none             Stop advil, motrin, aleve, ibuprofen, BC Powders, Goody's, vitamins/herbal medicines.   Do not wear jewelry.  Do not wear lotions, powders, or perfumes, or deoderant.  Do not shave 48 hours prior to surgery.  Men may shave face and neck.  Do not bring valuables to the hospital.  [facility] is not responsible for any belongings or valuables.  Contacts, dentures or bridgework may not be worn into surgery.  Leave your suitcase in the car.  After surgery it may be brought to your room.  For patients admitted to the hospital, discharge time will be determined by your treatment team.  Patients discharged the day of surgery will not be allowed to drive home.    Special instructions:  [place]- Preparing For Surgery  Before surgery, you can play an important role. Because skin is not sterile, your skin needs to be as free of germs as possible. You can reduce the number of germs on your skin by washing with CHG (chlorahexidine gluconate) Soap before surgery.  CHG is an antiseptic cleaner which kills germs and bonds with the skin to continue killing germs even after washing.  Please do not use if you have an allergy to CHG or antibacterial soaps. If your skin becomes reddened/irritated stop using the CHG.  Do not shave  (including legs and underarms) for at least 48 hours prior to first CHG shower. It is OK to shave your face.  Please follow these instructions carefully.   1. Shower the NIGHT BEFORE SURGERY and the MORNING OF SURGERY with CHG.   2. If you chose to wash your hair, wash your hair first as usual with your normal shampoo.  3. After you shampoo, rinse your hair and body thoroughly to remove the shampoo.  4. Use CHG as you would any other liquid soap. You can apply CHG directly to the skin and wash gently with a scrungie or a clean washcloth.   5. Apply the CHG Soap to your body ONLY FROM THE NECK DOWN.  Do not use on open wounds or open sores. Avoid contact with your eyes, ears, mouth and genitals (private parts). Wash genitals (private parts) with your normal soap.  6. Wash thoroughly, paying special attention to the area where your surgery will be performed.  7. Thoroughly rinse your body with warm water from the neck down.  8. DO NOT shower/wash with your normal soap after using and rinsing off the CHG Soap.  9. Pat yourself dry with a CLEAN TOWEL.   10. Wear CLEAN PAJAMAS   11. Place CLEAN SHEETS on your bed the night of your first shower and DO NOT SLEEP WITH PETS.    Day of Surgery: Do  not apply any deodorants/lotions. Please wear clean clothes to the hospital/surgery center.      Please read over the following fact sheets that you were given. Coughing and Deep Breathing, MRSA Information and Surgical Site Infection Prevention

## 2016-12-02 ENCOUNTER — Ambulatory Visit (HOSPITAL_COMMUNITY)
Admission: RE | Admit: 2016-12-02 | Discharge: 2016-12-02 | Disposition: A | Discharge status: Home / Self Care | Payer: Medicare Other | Source: Ambulatory Visit | Attending: Cardiovascular Disease | Admitting: Cardiovascular Disease

## 2016-12-02 ENCOUNTER — Ambulatory Visit: Payer: Medicare Other | Admitting: Cardiology

## 2016-12-02 ENCOUNTER — Encounter (HOSPITAL_COMMUNITY): Payer: Self-pay

## 2016-12-02 ENCOUNTER — Encounter (HOSPITAL_COMMUNITY)
Admission: RE | Admit: 2016-12-02 | Discharge: 2016-12-02 | Disposition: A | Discharge status: Home / Self Care | Payer: Medicare Other | Source: Ambulatory Visit | Attending: Cardiovascular Disease | Admitting: Cardiovascular Disease

## 2016-12-02 ENCOUNTER — Encounter: Payer: Self-pay | Admitting: Thoracic Surgery (Cardiothoracic Vascular Surgery)

## 2016-12-02 ENCOUNTER — Institutional Professional Consult (permissible substitution) (INDEPENDENT_AMBULATORY_CARE_PROVIDER_SITE_OTHER): Payer: Medicare Other | Admitting: Thoracic Surgery (Cardiothoracic Vascular Surgery)

## 2016-12-02 VITALS — BP 126/76 | HR 65 | Resp 16 | Ht 69.0 in | Wt 160.0 lb

## 2016-12-02 DIAGNOSIS — Z0389 Encounter for observation for other suspected diseases and conditions ruled out: Secondary | ICD-10-CM | POA: Diagnosis not present

## 2016-12-02 DIAGNOSIS — Z01810 Encounter for preprocedural cardiovascular examination: Secondary | ICD-10-CM | POA: Insufficient documentation

## 2016-12-02 DIAGNOSIS — Z01812 Encounter for preprocedural laboratory examination: Secondary | ICD-10-CM | POA: Insufficient documentation

## 2016-12-02 DIAGNOSIS — I350 Nonrheumatic aortic (valve) stenosis: Secondary | ICD-10-CM

## 2016-12-02 HISTORY — DX: Cardiac murmur, unspecified: R01.1

## 2016-12-02 HISTORY — DX: Malignant (primary) neoplasm, unspecified: C80.1

## 2016-12-02 HISTORY — DX: Personal history of other diseases of the digestive system: Z87.19

## 2016-12-02 LAB — COMPREHENSIVE METABOLIC PANEL
ALT: 12 U/L — AB (ref 17–63)
AST: 20 U/L (ref 15–41)
Albumin: 4 g/dL (ref 3.5–5.0)
Alkaline Phosphatase: 70 U/L (ref 38–126)
Anion gap: 8 (ref 5–15)
BUN: 19 mg/dL (ref 6–20)
CHLORIDE: 107 mmol/L (ref 101–111)
CO2: 22 mmol/L (ref 22–32)
Calcium: 9 mg/dL (ref 8.9–10.3)
Creatinine, Ser: 0.93 mg/dL (ref 0.61–1.24)
GFR calc Af Amer: >60 mL/min (ref >60)
GFR calc non Af Amer: >60 mL/min (ref >60)
GLUCOSE: 101 mg/dL — AB (ref 65–99)
POTASSIUM: 4.1 mmol/L (ref 3.5–5.1)
SODIUM: 137 mmol/L (ref 135–145)
Total Bilirubin: 0.6 mg/dL (ref 0.3–1.2)
Total Protein: 6.5 g/dL (ref 6.5–8.1)

## 2016-12-02 LAB — CBC
HCT: 37.8 % — ABNORMAL LOW (ref 39.0–52.0)
Hemoglobin: 12.2 g/dL — ABNORMAL LOW (ref 13.0–17.0)
MCH: 28.9 pg (ref 26.0–34.0)
MCHC: 32.3 g/dL (ref 30.0–36.0)
MCV: 89.6 fL (ref 78.0–100.0)
PLATELETS: 214 10*3/uL (ref 150–400)
RBC: 4.22 MIL/uL (ref 4.22–5.81)
RDW: 14.1 % (ref 11.5–15.5)
WBC: 6.2 10*3/uL (ref 4.0–10.5)

## 2016-12-02 LAB — PROTIME-INR
INR: 1.1
Prothrombin Time: 14.3 seconds (ref 11.4–15.2)

## 2016-12-02 LAB — URINALYSIS, ROUTINE W REFLEX MICROSCOPIC
BACTERIA UA: NONE SEEN
BILIRUBIN URINE: NEGATIVE
Glucose, UA: NEGATIVE mg/dL
KETONES UR: NEGATIVE mg/dL
LEUKOCYTES UA: NEGATIVE
NITRITE: NEGATIVE
PROTEIN: NEGATIVE mg/dL
SPECIFIC GRAVITY, URINE: 1.023 (ref 1.005–1.030)
pH: 5 (ref 5.0–8.0)

## 2016-12-02 LAB — BLOOD GAS, ARTERIAL
Acid-base deficit: 1.8 mmol/L (ref 0.0–2.0)
Bicarbonate: 22.2 mmol/L (ref 20.0–28.0)
DRAWN BY: 449841
FIO2: 21
O2 SAT: 97.3 %
PATIENT TEMPERATURE: 98.6
pCO2 arterial: 36.5 mmHg (ref 32.0–48.0)
pH, Arterial: 7.402 (ref 7.350–7.450)
pO2, Arterial: 97.4 mmHg (ref 83.0–108.0)

## 2016-12-02 LAB — APTT: aPTT: 37 seconds — ABNORMAL HIGH (ref 24–36)

## 2016-12-02 LAB — TYPE AND SCREEN
ABO/RH(D): O POS
Antibody Screen: NEGATIVE

## 2016-12-02 LAB — SURGICAL PCR SCREEN
MRSA, PCR: NEGATIVE
STAPHYLOCOCCUS AUREUS: NEGATIVE

## 2016-12-02 LAB — ABO/RH: ABO/RH(D): O POS

## 2016-12-02 NOTE — Progress Notes (Signed)
PCP is [name], MD Referring Provider is [name], [name], MD  Chief Complaint  Patient presents with  . Aortic Stenosis    severe...2ND TAVR EVAL with all studies    HPI: Jonathan Graham is an 83 yo man with known aortic stenosis.  He is a poor historian and most of the history comes from his wife.  Jonathan Graham an 76 year old man with a history of hypercholesterolemia, left bundle-branch block, prostate cancer, arthritis, cervical spine disease, hiatal hernia and known moderate aortic stenosis. He has been followed for aortic stenosis dating back to at least 2013. At that time his mean gradient was 50 mmHg as ejection fraction was 40-45%. He had a routine follow-up echo in February of this year which showed a mean gradient of 20 mmHg and a peak of 32 mmHg. Aortic valve was heavily calcified with restricted leaflet mobility. His ejection fraction had decreased to 20-30% with mild mitral regurgitation. A dobutamine stress echo showed doubling of cardiac output and rising the mean gradient from 23 to 48 mmHg.  He says that he's been active physically. He works in the yard a lot. He claims that he can go up and down stairs without difficulty but his wife says that he often has to sit down and rest for prolonged period time after walking up one flight of stairs. She says he appears fatigued although he denies. He denies orthopnea, paroxysmal nocturnal dyspnea, and peripheral edema  Past Medical History:  Diagnosis Date  . Arthritis   . Atypical chest pain    history of   . Back pain   . Cancer [facility]) 2014   prostate  . Cardiovascular stress test abnormal 04/25/2004   Abnormal adenosine Cardiolite study / evidence of inferoseptal ischemia &  an EF of 48% ""recommended that he undergo cardiac catheterization""  . Cervical spine disease   . Dizziness    occasionally apon bending over  . Fatigue    history of   . Heart murmur   . History of hiatal hernia   . History of mononucleosis    . Hypercholesterolemia   . Left bundle branch block   . Pleurisy 07/2005  . S/P radiation therapy  12/05/2012 through 01/30/2013                                                         Prostate 7800 cGy 40 sessions, seminal vesicles 5600 cGy 40 sessions                       . Shortness of breath   . Spondylosis, cervical    at C3-C4  . Varicose veins   . Weakness    history of     Past Surgical History:  Procedure Laterality Date  . BACK SURGERY  1995    2 ruptured discs in lower back / by Dr. Louanne Skye  . CARDIAC CATHETERIZATION  2005   EF estimated at 50% /  PROCEDURE:  Left heart catheterization, coronary and left ventricular angiography /  RAO view demonstrates mild LV enlargement  / mild hypokinesia anteroseptal with  overall low normal LV systolic function / Normal coronary anatomy  . HEMORRHOID SURGERY    . PROSTATE BIOPSY  08/25/2012   Gleason 3+3=6 PSA 7.80  . RIGHT/LEFT HEART CATH AND  CORONARY ANGIOGRAPHY N/A 11/13/2016   Procedure: Right/Left Heart Cath and Coronary Angiography;  Surgeon: [name] [name], MD;  Location: [facility];  Service: Cardiovascular;  Laterality: N/A;  . TONSILLECTOMY AND ADENOIDECTOMY      Family History  Problem Relation Age of Onset  . Stroke Father   . Cancer Father   . Heart failure Mother   . Heart disease Mother   . Multiple sclerosis Sister   . Lung cancer Brother     Social History Social History  Substance Use Topics  . Smoking status: Former Smoker    Packs/day: 1.00    Years: 7.00    Types: Cigarettes    Quit date: 09/01/1951  . Smokeless tobacco: Never Used  . Alcohol use No    Current Outpatient Prescriptions  Medication Sig Dispense Refill  . aspirin EC 81 MG tablet Take 81 mg by mouth daily.    . simvastatin (ZOCOR) 10 MG tablet Take 5 mg by mouth daily at 6 PM.    . tamsulosin (FLOMAX) 0.4 MG CAPS capsule Take 0.4 mg by mouth daily. Daily during the evening     Current Facility-Administered Medications   Medication Dose Route Frequency Provider Last Rate Last Dose  . aspirin chewable tablet 81 mg  81 mg Oral Once [name] [name], MD        Allergies  Allergen Reactions  . Lipitor [Atorvastatin Calcium] Other (See Comments)    cramps    Review of Systems  Constitutional: Positive for activity change, fatigue (Per wife) and fever (Most recent was in October 2017).  HENT: Negative for trouble swallowing and voice change.   Respiratory: Positive for shortness of breath (Per wife).   Cardiovascular: Negative for chest pain and leg swelling.    BP 126/76 (BP Location: Left Arm, Patient Position: Sitting, Cuff Size: Normal)   Pulse 65   Resp 16   Ht 5\' 9"  (1.753 m)   Wt 160 lb (72.6 kg)   SpO2 95% Comment: ON RA  BMI 23.63 kg/m  Physical Exam  Constitutional: He is oriented to person, place, and time. He appears well-developed and well-nourished. No distress.  HENT:  Head: Normocephalic and atraumatic.  Mouth/Throat: No oropharyngeal exudate.  Eyes: Conjunctivae and EOM are normal. No scleral icterus.  Neck: Neck supple.  Cardiovascular: Normal rate and regular rhythm.   Murmur (2/6 systolic murmur crescendo decrescendo) heard. Pulmonary/Chest: Effort normal and breath sounds normal. No respiratory distress. He has no wheezes. He has no rales.  Abdominal: Soft. He exhibits no distension. There is no tenderness.  Musculoskeletal: He exhibits no edema.  Lymphadenopathy:    He has no cervical adenopathy.  Neurological: He is alert and oriented to person, place, and time. No cranial nerve deficit.  Skin: Skin is warm and dry.  Vitals reviewed.    Diagnostic Tests: Echocardiogram Study Conclusions  - Left ventricle: The cavity size was normal. Wall thickness was   increased in a pattern of mild LVH. Systolic function was   severely reduced. The estimated ejection fraction was in the   range of 25% to 30%. Doppler parameters are consistent with   abnormal left ventricular  relaxation (grade 1 diastolic   dysfunction). - Aortic valve: Moderately to severely calcified annulus.   Moderately thickened, moderately calcified leaflets. Valve area   (VTI): 1.27 cm^2. Valve area (Vmax): 1.3 cm^2. Valve area   (Vmean): 1.26 cm^2. - Mitral valve: There was mild regurgitation. - Right atrium: The atrium was moderately  dilated.  Impressions:  - The arotic valve is heavily calcified with restricted leaflet   mobility.   The peak gradient is 32 with a mean gradient of 20. The LV   function is severely reduced .   Consider low gradient / low cardiac output severe aortic   stenosis. Cardiac catheterization Conclusion     Ost 1st Mrg to 1st Mrg lesion, 50 %stenosed.  There is moderate to severe left ventricular systolic dysfunction.  LV end diastolic pressure is normal.  The left ventricular ejection fraction is 25-35% by visual estimate.  LV end diastolic pressure is normal.  There is moderate aortic valve stenosis.   1. Mild nonobstructive CAD 2. Normal right heart pressures 3. Normal LV filling pressures  4. Moderate Aortic stenosis with mean gradient of 26 mm Hg. AV area may be underestimated due to low EF.   Plan: consider AVR/TAVR.       Impression: Mr. Crall and 83 year old gentleman with [place] York Heart Association class II symptoms of dyspnea and fatigue with mild-to-moderate exertion in the setting of low gradient, low ejection fraction aortic stenosis. The valve gradient has stayed the same as the ejection fraction has dropped over the past several years. Dobutamine echo did show significant contractile reserve suggesting he should benefit from aortic valve replacement.  Although not prohibitive he certainly is at increased risk for conventional aortic valve replacement due to his advanced age and low ejection fraction. I think TAVR is a better option in his case.  Mr. and Mrs. Graham have already spoken at length with Dr. Cyndia Bent  and Dr. Julianne Handler regarding TAVR. They had some additional questions which I answered for them. He is scheduled for surgery next Tuesday.  Plan: TAVR on 12/08/2016  Melrose Nakayama, MD [facility] [phone]

## 2016-12-02 NOTE — Progress Notes (Signed)
PCP: Dr. Darnell Level [place] Cardiologist: Dr. Peter [name] Urologist: Dr. Risa Grill

## 2016-12-03 LAB — HEMOGLOBIN A1C
HEMOGLOBIN A1C: 5.8 % — AB (ref 4.8–5.6)
Mean Plasma Glucose: 120 mg/dL

## 2016-12-07 MED ORDER — CHLORHEXIDINE GLUCONATE 0.12 % MT SOLN
15.0000 mL | Freq: Once | OROMUCOSAL | Status: AC
  Administered 2016-12-08: 15 mL via OROMUCOSAL
  Filled 2016-12-07: qty 15

## 2016-12-07 MED ORDER — NITROGLYCERIN IN D5W 200-5 MCG/ML-% IV SOLN
2.0000 ug/min | INTRAVENOUS | Status: DC
Start: 2016-12-08 — End: 2016-12-08
  Filled 2016-12-07: qty 250

## 2016-12-07 MED ORDER — EPINEPHRINE PF 1 MG/ML IJ SOLN
0.0000 ug/min | INTRAVENOUS | Status: DC
  Filled 2016-12-07: qty 4

## 2016-12-07 MED ORDER — MAGNESIUM SULFATE 50 % IJ SOLN
40.0000 meq | INTRAMUSCULAR | Status: DC
  Filled 2016-12-07: qty 10

## 2016-12-07 MED ORDER — SODIUM CHLORIDE 0.9 % IV SOLN
30.0000 ug/min | INTRAVENOUS | Status: DC
  Filled 2016-12-07: qty 2

## 2016-12-07 MED ORDER — SODIUM CHLORIDE 0.9 % IV SOLN
INTRAVENOUS | Status: DC
  Filled 2016-12-07: qty 30

## 2016-12-07 MED ORDER — VANCOMYCIN HCL 10 G IV SOLR
1250.0000 mg | INTRAVENOUS | Status: AC
  Administered 2016-12-08: 1250 mg via INTRAVENOUS
  Filled 2016-12-07: qty 1250

## 2016-12-07 MED ORDER — SODIUM CHLORIDE 0.9 % IV SOLN: INTRAVENOUS | Status: DC

## 2016-12-07 MED ORDER — POTASSIUM CHLORIDE 2 MEQ/ML IV SOLN
80.0000 meq | INTRAVENOUS | Status: DC
  Filled 2016-12-07: qty 40

## 2016-12-07 MED ORDER — DOPAMINE-DEXTROSE 3.2-5 MG/ML-% IV SOLN
0.0000 ug/kg/min | INTRAVENOUS | Status: DC
  Filled 2016-12-07: qty 250

## 2016-12-07 MED ORDER — SODIUM CHLORIDE 0.9 % IV SOLN
INTRAVENOUS | Status: DC
  Filled 2016-12-07: qty 2.5

## 2016-12-07 MED ORDER — DEXMEDETOMIDINE HCL IN NACL 400 MCG/100ML IV SOLN
0.1000 ug/kg/h | INTRAVENOUS | Status: AC
  Administered 2016-12-08: .3 ug/kg/h via INTRAVENOUS
  Filled 2016-12-07: qty 100

## 2016-12-07 MED ORDER — DEXTROSE 5 % IV SOLN
1.5000 g | INTRAVENOUS | Status: AC
  Administered 2016-12-08: 1.5 g via INTRAVENOUS
  Filled 2016-12-07: qty 1.5

## 2016-12-07 MED ORDER — NOREPINEPHRINE BITARTRATE 1 MG/ML IV SOLN
0.0000 ug/min | INTRAVENOUS | Status: DC
  Filled 2016-12-07: qty 4

## 2016-12-07 NOTE — H&P (Signed)
[address]       [place] [address]             [phone]      Cardiothoracic Surgery Admission History and Physical   Referring Provider is [name], [name], MD PCP is [name], MD      Chief Complaint  Patient presents with  . Aortic Stenosis        HPI:  The patient is an 83 year old gentleman with a history of hypercholesterolemia, LBBB, hx or prostate cancer in 2014 treated with XRT and known moderate AS by echo. His echo in 10/2014 showed a mean gradient of 24 mm Hg and an LVEF of 40-45%. His mean gradient in 07/2012 was 15 mm Hg and the EF was the same. His initial echo in 2007 showed an EF of 55-60%. He says that he has been in his usual state of health and was scheduled for a routine follow up echo on 10/15/2016. This showed a mean AV gradient of 20 mm Hg with a peak of 32. The aortic valve is heavily calcified and trileaflet with restricted mobility. His EF was down to 25-30% with mild MR. A dobutamine stress echo showed significant contractile reserve with a doubling of cardiac output on peak dobutamine and a rise in the mean gradient from 23 to 48 mm Hg. He subsequently underwent a right and left heart cath showing mild nonobstructive CAD with normal right heart pressures and normal LV filling pressure with a mean AV gradient of 26 mm Hg.  The patient remains physically active working in his yard and chopping wood. He walks up a couple flights of stairs daily with some shortness of breath. His wife feels like he is more symptomatic than he is reporting and she has noted decline in his activity over the past year. He has some dizziness but has never had syncope.      Past Medical History:  Diagnosis Date  . Arthritis   . Atypical chest pain    history of   . Back pain   . Cardiovascular stress test abnormal 04/25/2004   Abnormal adenosine Cardiolite study / evidence of inferoseptal ischemia &  an EF of 48% ""recommended that  he undergo cardiac catheterization""  . Cervical spine disease   . Dizziness    occasionally apon bending over  . Fatigue    history of   . History of mononucleosis   . Hypercholesterolemia   . Left bundle branch block   . Pleurisy 07/2005  . S/P radiation therapy  12/05/2012 through 01/30/2013                                                         Prostate 7800 cGy 40 sessions, seminal vesicles 5600 cGy 40 sessions                       . Shortness of breath   . Spondylosis, cervical    at C3-C4  . Varicose veins   . Weakness    history of          Past Surgical History:  Procedure Laterality Date  . BACK SURGERY  1995    2 ruptured discs in lower back / by Dr. Louanne Skye  .  CARDIAC CATHETERIZATION  2005   EF estimated at 50% /  PROCEDURE:  Left heart catheterization, coronary and left ventricular angiography /  RAO view demonstrates mild LV enlargement  / mild hypokinesia anteroseptal with  overall low normal LV systolic function / Normal coronary anatomy  . HEMORRHOID SURGERY    . PROSTATE BIOPSY  08/25/2012   Gleason 3+3=6 PSA 7.80  . RIGHT/LEFT HEART CATH AND CORONARY ANGIOGRAPHY N/A 11/13/2016   Procedure: Right/Left Heart Cath and Coronary Angiography;  Surgeon: [name] [name], MD;  Location: [facility];  Service: Cardiovascular;  Laterality: N/A;  . TONSILLECTOMY AND ADENOIDECTOMY           Family History  Problem Relation Age of Onset  . Stroke Father   . Cancer Father   . Heart failure Mother   . Heart disease Mother   . Multiple sclerosis Sister   . Lung cancer Brother     Social History        Social History  . Marital status: Married    Spouse name: N/A  . Number of children: 2  . Years of education: N/A        Occupational History  . Insurance     [employer]        Social History Main Topics  . Smoking status: Former Smoker    Packs/day: 1.00    Years: 7.00    Types:  Cigarettes    Quit date: 09/01/1951  . Smokeless tobacco: Never Used  . Alcohol use No  . Drug use: No  . Sexual activity: Not on file       Other Topics Concern  . Not on file      Social History Narrative  . No narrative on file          Current Outpatient Prescriptions  Medication Sig Dispense Refill  . simvastatin (ZOCOR) 10 MG tablet TAKE 0.5 TABLETS (5 MG TOTAL) BY MOUTH AT BEDTIME. 45 tablet 2  . tamsulosin (FLOMAX) 0.4 MG CAPS capsule Take 1 capsule (0.4 mg total) by mouth daily. 90 capsule 3            Current Facility-Administered Medications  Medication Dose Route Frequency Provider Last Rate Last Dose  . aspirin chewable tablet 81 mg  81 mg Oral Once [name] [name], MD             Allergies  Allergen Reactions  . Lipitor [Atorvastatin Calcium] Other (See Comments)    cramps      Review of Systems:              General:                      normal appetite, decreased energy, no weight gain, no weight loss, no fever             Cardiac:                       no chest pain with exertion, no chest pain at rest, mild SOB with moderate exertion, no resting SOB, no PND, no orthopnea, no palpitations, no arrhythmia, no atrial fibrillation, some LE edema, occasional dizzy spells, no syncope             Respiratory:                 exertional shortness of breath, no home oxygen, no productive cough, no dry cough, no bronchitis,  no wheezing, no hemoptysis, no asthma, no pain with inspiration or cough, no sleep apnea, no CPAP at night             GI:                               no difficulty swallowing, no reflux, no frequent heartburn, no hiatal hernia, no abdominal pain, no constipation, no diarrhea, no hematochezia, no hematemesis, no melena             GU:                              no dysuria,  no frequency, no urinary tract infection, no hematuria, no enlarged prostate, no kidney stones, no kidney disease             Vascular:                     no  pain suggestive of claudication, no pain in feet, no leg cramps, has varicose veins worse in the right leg, no DVT, no non-healing foot ulcer             Neuro:                         no stroke, no TIA's, no seizures, no headaches, no temporary blindness one eye,  no slurred speech, no peripheral neuropathy, no chronic pain, no instability of gait, no memory/cognitive dysfunction             Musculoskeletal:         no arthritis, no joint swelling, no myalgias, no difficulty walking, normal mobility              Skin:                            no rash, no itching, no skin infections, no pressure sores or ulcerations             Psych:                         no anxiety, no depression, no nervousness, no unusual recent stress             Eyes:                           no blurry vision, no floaters, no recent vision changes,  wears glasses or contacts             ENT:                            has hearing loss and wears hearing aids, no loose or painful teeth, no dentures, last saw dentist every six months.             Hematologic:               no easy bruising, no abnormal bleeding, no clotting disorder, no frequent epistaxis             Endocrine:                   no diabetes, does not check CBG's at home  Physical Exam:              BP 129/77 (BP Location: Left Arm, Patient Position: Sitting, Cuff Size: Large)   Pulse 75   Resp 16   Ht 5\' 9"  (1.753 m)   Wt 160 lb (72.6 kg)   SpO2 96% Comment: ON RA  BMI 23.63 kg/m              General:                      Elderly but  well-appearing             HEENT:                       Unremarkable, NCAT, PERLA, EOMI, oropharynx clear             Neck:                           no JVD, no bruits, no adenopathy or thyromegaly             Chest:                          clear to auscultation, symmetrical breath sounds, no wheezes, no rhonchi              CV:                               RRR, grade II/VI crescendo/decrescendo murmur heard best at RSB to apex, no no diastolic murmur             Abdomen:                    soft, non-tender, no masses or organomegaly             Extremities:                 warm, well-perfused, pulses palpable, mild LE edema, marked right lower leg varicose veins, few on the left.             Rectal/GU                   Deferred             Neuro:                         Grossly non-focal and symmetrical throughout             Skin:                            Clean and dry, no rashes, no breakdown   Diagnostic Tests:       [facility]            [address], [place] [address]              [phone]  ------------------------------------------------------------------- Transthoracic Echocardiography  Patient:  [place], Want MR #:    [id number] Study Date: 11/02/2014 Gender:   M Age:    60 Height:   182.9 cm Weight:   79.8 kg BSA:    2.02 m^2  Pt. Status: Room:  ORDERING   Darlin Coco REFERRING  [facility] SONOGRAPHER [place], [place] ATTENDING  Ena Dawley, M.D. PERFORMING  [facility] Outpatient  cc:  ------------------------------------------------------------------- LV EF: 40% -  45%  ------------------------------------------------------------------- Indications:   (I35.8).  ------------------------------------------------------------------- History:  PMH: LBBB. PVCs. Mild AS. Acquired from the patient and from the patient&'s chart. Murmur. Aortic valve disease. Risk factors: Former tobacco use. Dyslipidemia.  ------------------------------------------------------------------- Study Conclusions  - Left ventricle: The cavity size was normal. There was mild focal basal hypertrophy of the septum. Systolic function was mildly to moderately reduced. The estimated ejection fraction was in  the range of 40% to 45%. Diffuse hypokinesis and paradoxical septal motion consistent with LBBB. Doppler parameters are consistent with abnormal left ventricular relaxation (grade 1 diastolic dysfunction). - Ventricular septum: Septal motion showed paradox. - Aortic valve: Valve mobility was restricted. There was moderate stenosis. There was trivial regurgitation. Mean gradient (S): 24 mm Hg. - Mitral valve: Calcified annulus. Mildly thickened leaflets . There was mild regurgitation. - Left atrium: The atrium was mildly dilated. - Right ventricle: Systolic function was normal. - Right atrium: The atrium was normal in size. - Tricuspid valve: There was mild regurgitation. - Pulmonic valve: There was mild regurgitation. - Pulmonary arteries: Systolic pressure was within the normal range. PA peak pressure: 32 mm Hg (S). - Inferior vena cava: The vessel was normal in size. - Pericardium, extracardiac: There was no pericardial effusion.  Impressions:  - Compared to the prior study from 2013 the aortic stenosis is now moderate.  Transthoracic echocardiography. M-mode, complete 2D, spectral Doppler, and color Doppler. Birthdate: Patient birthdate: April 05, 1928. Age: Patient is 83 yr old. Sex: Gender: male. BMI: 23.9 kg/m^2. Blood pressure:   124/66 Patient status: Outpatient. Study date: Study date: 11/02/2014. Study time: 09:52 AM. Location: [facility]  -------------------------------------------------------------------  ------------------------------------------------------------------- Left ventricle: The cavity size was normal. There was mild focal basal hypertrophy of the septum. Systolic function was mildly to moderately reduced. The estimated ejection fraction was in the range of 40% to 45%. Diffuse hypokinesis and paradoxical septal motion consistent with LBBB. Doppler parameters are consistent with abnormal left ventricular relaxation  (grade 1 diastolic dysfunction). There was no evidence of elevated ventricular filling pressure by Doppler parameters.  ------------------------------------------------------------------- Aortic valve:  Trileaflet; moderately thickened, moderately calcified leaflets. Valve mobility was restricted. Doppler: There was moderate stenosis.  There was trivial regurgitation. VTI ratio of LVOT to aortic valve: 0.24. Valve area (VTI): 1.08 cm^2. Indexed valve area (VTI): 0.54 cm^2/m^2. Mean velocity ratio of LVOT to aortic valve: 0.24. Valve area (Vmean): 1.1 cm^2. Indexed valve area (Vmean): 0.54 cm^2/m^2.  Mean gradient (S): 24 mm Hg.  ------------------------------------------------------------------- Aorta: Aortic root: The aortic root was normal in size.  ------------------------------------------------------------------- Mitral valve:  Calcified annulus. Mildly thickened leaflets . Mobility was not restricted. Doppler: Transvalvular velocity was within the normal range. There was no evidence for stenosis. There was mild regurgitation.  ------------------------------------------------------------------- Left atrium: The atrium was mildly dilated.  ------------------------------------------------------------------- Right ventricle: The cavity size was normal. Wall thickness was normal. Systolic function was normal.  ------------------------------------------------------------------- Ventricular septum:  Septal motion showed paradox.  ------------------------------------------------------------------- Pulmonic valve:  Structurally normal valve.  Cusp separation was normal. Doppler: Transvalvular velocity was within the normal range. There was no evidence for stenosis. There was mild regurgitation.  ------------------------------------------------------------------- Tricuspid valve:  Structurally normal valve.  Doppler: Transvalvular velocity was within the  normal range. There was mild regurgitation.  ------------------------------------------------------------------- Pulmonary artery:  The main pulmonary artery  was normal-sized. Systolic pressure was within the normal range.  ------------------------------------------------------------------- Right atrium: The atrium was normal in size.  ------------------------------------------------------------------- Pericardium: There was no pericardial effusion.  ------------------------------------------------------------------- Systemic veins: Inferior vena cava: The vessel was normal in size.  ------------------------------------------------------------------- Measurements  Left ventricle              Value     Reference LV ID, ED, PLAX chordal          48.8 mm    43 - 52 LV ID, ES, PLAX chordal      (H)   39.2 mm    23 - 38 LV fx shortening, PLAX chordal  (L)   20  %    >=29 LV PW thickness, ED            10.2 mm    --------- IVS/LV PW ratio, ED            1.12      <=1.3 Stroke volume, 2D             84  ml    --------- Stroke volume/bsa, 2D           42  ml/m^2  --------- LV e&', lateral              9.36 cm/s   --------- LV E/e&', lateral             5.43      --------- LV e&', medial               5.95 cm/s   --------- LV E/e&', medial              8.54      --------- LV e&', average              7.66 cm/s   --------- LV E/e&', average             6.64      ---------  Ventricular septum            Value     Reference IVS thickness, ED             11.4 mm    ---------  LVOT                   Value     Reference LVOT ID, S                24  mm    --------- LVOT area                  4.52 cm^2   --------- LVOT ID                  24  mm    --------- LVOT peak velocity, S           83.5 cm/s   --------- LVOT mean velocity, S           54.3 cm/s   --------- LVOT VTI, S                18.5 cm    --------- Stroke volume (SV), LVOT DP        83.7 ml    --------- Stroke index (SV/bsa), LVOT DP      41.5 ml/m^2  ---------  Aortic valve               Value     Reference Aortic valve mean  velocity, S       224  cm/s   --------- Aortic valve VTI, S            77.5 cm    --------- Aortic mean gradient, S          24  mm Hg  --------- VTI ratio, LVOT/AV            0.24      --------- Aortic valve area, VTI          1.08 cm^2   --------- Aortic valve area/bsa, VTI        0.54 cm^2/m^2 --------- Velocity ratio, mean, LVOT/AV       0.24      --------- Aortic valve area, mean velocity     1.1  cm^2   --------- Aortic valve area/bsa, mean        0.54 cm^2/m^2 --------- velocity  Aorta                   Value     Reference Aortic root ID, ED            33  mm    --------- Ascending aorta ID, A-P, S        35  mm    ---------  Left atrium                Value     Reference LA ID, A-P, ES              42  mm    --------- LA ID/bsa, A-P              2.08 cm/m^2  <=2.2 LA volume, S               86  ml    --------- LA volume/bsa, S             42.6 ml/m^2  --------- LA volume, ES, 1-p A4C          85  ml    --------- LA volume/bsa, ES, 1-p A4C        42.1 ml/m^2  --------- LA volume, ES, 1-p A2C          79  ml    --------- LA  volume/bsa, ES, 1-p A2C        39.2 ml/m^2  ---------  Mitral valve               Value     Reference Mitral E-wave peak velocity        50.8 cm/s   --------- Mitral A-wave peak velocity        102  cm/s   --------- Mitral deceleration time     (H)   320  ms    150 - 230 Mitral E/A ratio, peak          0.5      ---------  Pulmonary arteries            Value     Reference PA pressure, S, DP        (H)   32  mm Hg  <=30  Tricuspid valve              Value     Reference Tricuspid regurg peak velocity      271  cm/s   --------- Tricuspid peak RV-RA gradient       29  mm Hg  ---------  Systemic veins  Value     Reference Estimated CVP               3   mm Hg  ---------  Right ventricle              Value     Reference RV pressure, S, DP        (H)   32  mm Hg  <=30 RV s&', lateral, S             15  cm/s   ---------  Legend: (L) and (H) mark values outside specified reference range.  ------------------------------------------------------------------- Prepared and Electronically Authenticated by  Ena Dawley, M.D. 2016-03-04T17:41:26  [facility] [phone], [place] [address] [phone]  ------------------------------------------------------------------- Stress Echocardiography  Patient: Arjun, Hard MR #: [id number] Study Date: 11/03/2016 Gender: M Age: 10 Height: 176.5 cm Weight: 77.7 kg BSA: 1.96 m^2 Pt. Status: Room:  ORDERING Peter [name], M.D. REFERRING Peter [name], M.D. SONOGRAPHER [place], Will ATTENDING Ena Dawley, M.D. PERFORMING  [facility] Outpatient  cc:  -------------------------------------------------------------------  ------------------------------------------------------------------- Indications: (I35.0).  ------------------------------------------------------------------- History: PMH: Aortic stenosis. LBBB. Risk factors: Dyslipidemia.  ------------------------------------------------------------------- Study Conclusions  - Stress ECG conclusions: There were no stress arrhythmias or conduction abnormalities. The stress ECG was negative for ischemia. - Staged echo: There was no echocardiographic evidence for stress-induced ischemia. - Impressions: Dobutamine Echo for low gradient AS:  Baseline EF moderately decreased did not have complete study to assess RWMAls but EF appeared 30-35% with diffuse hypokinesis and abnormal septal motion from LBBB.  Aortic valve severely calcified with restricted leaflet motion.  Baseline: CO: 5.1 L/min Peak Velocity 3.2 m/sec Mean gradient 23 mmHg Peak gradient 41 mmHg AVA 1.05 cm2 Peak: CO: 10/4 L/min Peak Velocity 4.46 m/sec Mean gradient 48 mmHg Peak gradient 79 mmHg AVA 1.5 cm2  Study would indicate moderate to severe AS. Diagnostic study as there is good LV functional reserve with CO going up by over 100% with dobutamine. Gradients increased but AVA also went up with inotropic reserve. AVA at rest and peak over 1 cm2.  Impressions:  - Dobutamine Echo for low gradient AS:  Baseline EF moderately decreased did not have complete study to assess RWMAls but EF appeared 30-35% with diffuse hypokinesis and abnormal septal motion from LBBB.  Aortic valve severely calcified with restricted leaflet motion.  Baseline: CO: 5.1 L/min Peak Velocity 3.2 m/sec Mean gradient 23 mmHg Peak gradient 41 mmHg AVA 1.05 cm2 Peak: CO: 10/4 L/min Peak Velocity 4.46 m/sec Mean gradient 48 mmHg Peak  gradient 79 mmHg AVA 1.5 cm2  Study would indicate moderate to severe AS. Diagnostic study as there is good LV functional reserve with CO going up by over 100% with dobutamine. Gradients increased but AVA also went up with inotropic reserve. AVA at rest and peak over 1 cm2.  ------------------------------------------------------------------- Study data: Study status: Routine. Consent: The risks, benefits, and alternatives to the procedure were explained to the patient and informed consent was obtained. Procedure: The patient reported no pain pre or post test. Initial setup. The patient was brought to the laboratory. A baseline ECG was recorded. Surface ECG leads and automatic cuff blood pressure measurements were monitored. Dobutamine stress test. Stress testing was performed, with dobutamine infusion from 5 to 20 mcg/kg/min by 5 mcg/kg/min increments. The infusion was terminated due to maximal dose administration. Transthoracic stress echocardiography for assessment of valvular function. Images were captured at baseline, low dose, peak dose, and recovery. Study completion: The patient  tolerated the procedure well. There were no complications. Dobutamine. Stress echocardiography. 2D. Birthdate: Patient birthdate: [birth year]. Age: Patient is 83 yr old. Sex: Gender: male. BMI: 24.9 kg/m^2. Blood pressure: 116/77 Patient status: Outpatient. Study date: Study date: 11/03/2016. Study time: 03:03 PM.  -------------------------------------------------------------------  ------------------------------------------------------------------- Aortic valve: Doppler: VTI ratio of LVOT to aortic valve: 0.3. Valve area (VTI): 1.37 cm^2. Indexed valve area (VTI): 0.7 cm^2/m^2. Peak velocity ratio of LVOT to aortic valve: 0.3. Valve area (Vmax): 1.35 cm^2. Indexed valve area (Vmax): 0.69 cm^2/m^2. Mean velocity ratio of LVOT to aortic valve: 0.3. Valve  area (Vmean): 1.36 cm^2. Indexed valve area (Vmean): 0.69 cm^2/m^2. Mean gradient (S): 25 mm Hg. Peak gradient (S): 45 mm Hg.  ------------------------------------------------------------------- Baseline ECG: Normal.  ------------------------------------------------------------------- Stress protocol:  +--------------------+--+-----------+--------------------+--------+ !Stage !HR!BP (mmHg) !Rhythm !Symptoms! +--------------------+--+-----------+--------------------+--------+ !Baseline !64!116/77 (90)!--------------------!None ! +--------------------+--+-----------+--------------------+--------+ !Dobutamine 5 !68!123/79 (94)!--------------------!--------! !ug/kg/min ! ! ! ! ! +--------------------+--+-----------+--------------------+--------+ !Dobutamine 10 !68!137/72 (94)!--------------------!--------! !ug/kg/min ! ! ! ! ! +--------------------+--+-----------+--------------------+--------+ !Dobutamine 20 !77!144/70 (95)!--------------------!--------! !ug/kg/min ! ! ! ! ! +--------------------+--+-----------+--------------------+--------+ !Immediate post !75!138/66 (90)!--------------------!--------! !stress ! ! ! ! ! +--------------------+--+-----------+--------------------+--------+ !Recovery; 1 min !78!-----------!--------------------!--------! +--------------------+--+-----------+--------------------+--------+ !Recovery; 2 min !90!147/68 (94)!Occasional PAC&'s, !--------! ! ! ! !ventricular couplets! ! +--------------------+--+-----------+--------------------+--------+ !Recovery; 3 min  !71!-----------!--------------------!--------! +--------------------+--+-----------+--------------------+--------+ !Recovery; 4 min !69!151/70 (97)!--------------------!--------! +--------------------+--+-----------+--------------------+--------+ !Recovery; 5 min !68!-----------!--------------------!--------! +--------------------+--+-----------+--------------------+--------+ !Recovery; 6 min !68!152/71 (98)!--------------------!--------! +--------------------+--+-----------+--------------------+--------+  ------------------------------------------------------------------- Stress results: Maximal heart rate during stress was 90 bpm (68% of maximal predicted heart rate). The maximal predicted heart rate was 133 bpm.The target heart rate was achieved. The heart rate response to stress was normal. There was a normal resting blood pressure with an appropriate response to stress. Normal blood pressure response to dobutamine. The rate-pressure product for the peak heart rate and blood pressure was 13230 mm Hg/min. The patient experienced no chest pain during stress.  ------------------------------------------------------------------- Stress ECG: There were no stress arrhythmias or conduction abnormalities. The stress ECG was negative for ischemia.  ------------------------------------------------------------------- Baseline:  Low dose: Peak stress: Recovery:  ------------------------------------------------------------------- Stress echo results: Left ventricular ejection fraction was normal at rest and with stress. There was no echocardiographic evidence for stress-induced ischemia.  ------------------------------------------------------------------- Measurements  Left ventricle Value Stroke volume, 2D 106 ml Stroke volume/bsa, 2D 54 ml/m^2  LVOT  Value LVOT ID, S 24 mm LVOT area 4.52 cm^2 LVOT peak velocity, S 101 cm/s LVOT mean velocity, S 68.5 cm/s LVOT VTI, S 23.4 cm LVOT peak gradient, S 4 mm Hg  Aortic valve Value Aortic valve peak velocity, S 337 cm/s Aortic valve mean velocity, S 228 cm/s Aortic valve VTI, S 77.2 cm Aortic mean gradient, S 25 mm Hg Aortic peak gradient, S 45 mm Hg VTI ratio, LVOT/AV 0.3 Aortic valve area, VTI 1.37 cm^2 Aortic valve area/bsa, VTI 0.7 cm^2/m^2 Velocity ratio, peak, LVOT/AV 0.3 Aortic valve area, peak velocity 1.35 cm^2 Aortic valve area/bsa, peak velocity 0.69 cm^2/m^2 Velocity ratio, mean, LVOT/AV 0.3 Aortic valve area, mean velocity 1.36 cm^2 Aortic valve area/bsa, mean velocity 0.69 cm^2/m^2  Legend: (L) and (H) mark values outside specified reference range.  ------------------------------------------------------------------- Prepared and Electronically Authenticated by  Jenkins Rouge, M.D. 2018-03-07T12:50:17  Donetta Potts  Cardiac catheterization  Order# [id number]  Reading physician: [name] [name], MD Ordering physician: [name] [name], MD Study date: 11/13/16  Physicians   Panel Physicians Referring Physician Case Authorizing Physician  [name] [name], MD (Primary)    Procedures   Right/Left Heart Cath and Coronary Angiography  Conclusion     Ost 1st Mrg to 1st Mrg lesion,  50 %stenosed.  There is moderate to severe left ventricular systolic dysfunction.  LV end diastolic pressure is normal.  The left ventricular  ejection fraction is 25-35% by visual estimate.  LV end diastolic pressure is normal.  There is moderate aortic valve stenosis.  1. Mild nonobstructive CAD 2. Normal right heart pressures 3. Normal LV filling pressures  4. Moderate Aortic stenosis with mean gradient of 26 mm Hg. AV area may be underestimated due to low EF.   Plan: consider AVR/TAVR.    Indications   Nonrheumatic aortic valve stenosis [I35.0 (ICD-10-CM)]  Procedural Details/Technique   Technical Details Indication: 83 yo WM with aortic stenosis. Echo shows worsening of EF to 30%. Dobutamine Echo consistent with moderate to severe low gradient AS.  Procedural Details: The right wrist was prepped, draped, and anesthetized with 1% lidocaine. Using the modified Seldinger technique a 6 Fr slender sheath was placed in the right radial artery and a 5 French sheath was placed in the right brachial vein. A Swan-Ganz catheter was used for the right heart catheterization. Standard protocol was followed for recording of right heart pressures and sampling of oxygen saturations. Fick cardiac output was calculated. Standard Judkins catheters were used for selective coronary angiography and left ventriculography. There were no immediate procedural complications. The patient was transferred to the post catheterization recovery area for further monitoring.  Contrast: 90 cc   Estimated blood loss <50 mL.  During this procedure the patient was administered the following to achieve and maintain moderate conscious sedation: Versed 1 mg, Fentanyl 25 mcg, while the patient's heart rate, blood pressure, and oxygen saturation were continuously monitored. The period of conscious sedation was 36 minutes, of which I was present face-to-face 100% of this time.    Complications   Complications documented before study signed (11/13/2016 9:37 AM EDT)    No complications were associated with this study.  Documented by [name] [name], MD -  11/13/2016 9:33 AM EDT    Coronary Findings   Dominance: Right  Left Main  Vessel was injected. Vessel is normal in caliber. Vessel is angiographically normal.  Left Anterior Descending  Vessel was injected. Vessel is normal in caliber. Vessel is angiographically normal.  Ramus Intermedius  Vessel was injected. Vessel is normal in caliber. Vessel is angiographically normal.  Left Circumflex  First Obtuse Marginal Branch  Ost 1st Mrg to 1st Mrg lesion, 50% stenosed.  Right Coronary Artery  Vessel was injected. Vessel is normal in caliber. Vessel is angiographically normal.  Right Heart   Right Heart Pressures LV EDP is normal. Normal right heart pressures    Wall Motion              Left Heart   Left Ventricle The left ventricular size is normal. There is moderate to severe left ventricular systolic dysfunction. LV end diastolic pressure is normal. The left ventricular ejection fraction is 25-35% by visual estimate. There are LV function abnormalities due to global hypokinesis.    Aortic Valve There is moderate aortic valve stenosis. The aortic valve is calcified. There is restricted aortic valve motion.    Coronary Diagrams   Diagnostic Diagram       Implants        No implant documentation for this case.  PACS Images   Show images for Cardiac catheterization   Link to Procedure Log   Procedure Log    Hemo Data    Most Recent Value  Fick Cardiac Output 7.44 L/min  Fick Cardiac  Output Index 3.96 (L/min)/BSA  Aortic Mean Gradient 26.6 mmHg  Aortic Peak Gradient 49 mmHg  Aortic Valve Area 1.52  Aortic Value Area Index 0.81 cm2/BSA  RA A Wave 2 mmHg  RA V Wave 1 mmHg  RA Mean 0 mmHg  RV Systolic Pressure 23 mmHg  RV Diastolic Pressure -1 mmHg  RV EDP 2 mmHg  PA Systolic Pressure 20 mmHg  PA Diastolic Pressure 1 mmHg  PA Mean 8 mmHg  PW A Wave 6 mmHg  PW V Wave 9 mmHg  PW Mean 4 mmHg  AO Systolic Pressure 314 mmHg  AO Diastolic Pressure  61 mmHg  AO Mean 79 mmHg  LV Systolic Pressure 970 mmHg  LV Diastolic Pressure 1 mmHg  LV EDP 1 mmHg  Arterial Occlusion Pressure Extended Systolic Pressure 87 mmHg  Arterial Occlusion Pressure Extended Diastolic Pressure 56 mmHg  Arterial Occlusion Pressure Extended Mean Pressure 72 mmHg  Left Ventricular Apex Extended Systolic Pressure 263 mmHg  Left Ventricular Apex Extended Diastolic Pressure 2 mmHg  Left Ventricular Apex Extended EDP Pressure 5 mmHg  QP/QS 1  TPVR Index 2.02 HRUI  TSVR Index 19.95 HRUI  TPVR/TSVR Ratio 0.1    CT CORONARY MORPH W/CTA COR W/SCORE W/CA W/CM &/OR WO/CM (Accession [id number]) (Order [id number])  Imaging  Date: 11/30/2016 Department: [facility] CT IMAGING Released By: Michael Boston Authorizing: Gaye Pollack, MD  Exam Information   Status Exam Begun  Exam Ended   Final [99] 11/30/2016 3:28 PM 11/30/2016 4:02 PM  PACS Images   Show images for CT CORONARY MORPH W/CTA COR W/SCORE W/CA W/CM &/OR WO/CM  Addendum   ADDENDUM REPORT: 12/07/2016 07:16  EXAM: OVER-READ INTERPRETATION  CT CHEST  The following report is an over-read performed by radiologist Dr. Rebekah Chesterfield [facility] on 12/07/2016. This over-read does not include interpretation of cardiac or coronary anatomy or pathology. The cardiac CTA interpretation by the cardiologist is attached.  COMPARISON:  PET-CT 08/19/2005.  FINDINGS: Extracardiac findings will be described separately under contemporaneously obtained CTA of the chest, abdomen and pelvis 11/30/2016.  IMPRESSION: Please see separate dictation for contemporaneously obtained CTA of the chest, abdomen and pelvis 11/30/2016 for full description of extracardiac findings.   Electronically Signed   By: Vinnie Langton M.D.   On: 12/07/2016 07:16   Addended by Etheleen Mayhew, MD on 12/07/2016 7:18 AM    Study Result   CLINICAL DATA:  83 year old male with severe aortic  stenosis being evaluated for a possible TAVR.  EXAM: Cardiac TAVR CT  TECHNIQUE: The patient was scanned on a Philips 256 scanner. A 120 kV retrospective scan was triggered in the descending thoracic aorta at 111 HU's. Gantry rotation speed was 270 msecs and collimation was .9 mm. 5 mg of iv Metoprolol and no nitro were given. The 3D data set was reconstructed in 5% intervals of the R-R cycle. Systolic and diastolic phases were analyzed on a dedicated work station using MPR, MIP and VRT modes. The patient received 80 cc of contrast.  FINDINGS: Aortic Valve: Severely thickened and calcified predominantly in the non-coronary leaflet. Minimal calcifications extending into the LVOT.  Aorta:  Normal size. Minimal diffuse calcifications.  No dissection.  Sinotubular Junction:  30 x 29 mm  Ascending Thoracic Aorta:  34 x 33 mm  Aortic Arch:  25 x 23 mm  Descending Thoracic Aorta:  25 x 25 mm  Sinus of Valsalva Measurements:  Non-coronary:  31 mm  Right -coronary:  34  mm  Left -coronary:  35 mm  Coronary Artery Height above Annulus:  Left Main:  10 mm  Right Coronary:  14 mm  Virtual Basal Annulus Measurements:  Maximum/Minimum Diameter:  29 x 25 mm  Perimeter:  88 mm  Area:  582 mm2  Optimum Fluoroscopic Angle for Delivery:  RAO 1 CRA 2  A very large left atrial appendage without thrombus.  Normal pulmonary vein drainage into the left atrium.  Normal size of the pulmonary artery.  No ASD/VSD.  IMPRESSION: 1. Severely thickened and calcified predominantly in the non-coronary leaflet. Minimal calcifications extending into the LVOT. Annular measurements suitable for a delivery of a 29 mm Edwards-SAPIEN 3 TAVR valve.  2.  Borderline annulus to LM distance (10 mm).  3. Optimum Fluoroscopic Angle for Delivery:  RAO 1 CRA 2  Ena Dawley  Electronically Signed: By: Ena Dawley On: 12/01/2016 16:17       CT Angio  Abd/Pel w/ and/or w/o (Accession [id number]) (Order [id number])  Imaging  Date: 11/30/2016 Department: [facility] CT IMAGING Released By: Michael Boston Authorizing: Gaye Pollack, MD  Exam Information   Status Exam Begun  Exam Ended   Final [99] 11/30/2016 3:26 PM 11/30/2016 4:02 PM  PACS Images   Show images for CT Angio Abd/Pel w/ and/or w/o  Study Result   CLINICAL DATA:  83 year old male with history of aortic stenosis. Preprocedural study prior to potential transcatheter aortic valve replacement (TAVR) procedure.  EXAM: CT ANGIOGRAPHY CHEST, ABDOMEN AND PELVIS  TECHNIQUE: Multidetector CT imaging through the chest, abdomen and pelvis was performed using the standard protocol during bolus administration of intravenous contrast. Multiplanar reconstructed images and MIPs were obtained and reviewed to evaluate the vascular anatomy.  CONTRAST:  100 mL of Isovue 370  COMPARISON:  CT pelvis 09/16/2012.  FINDINGS: CTA CHEST FINDINGS  Cardiovascular: Heart size is normal. There is no significant pericardial fluid, thickening or pericardial calcification. There is aortic atherosclerosis, as well as atherosclerosis of the great vessels of the mediastinum and the coronary arteries, including calcified atherosclerotic plaque in the left anterior descending coronary arteries. Severe thickening calcification of the aortic valve. Mild calcifications of the posterior leaflet of the mitral valve and mitral annulus.  Mediastinum/Lymph Nodes: No pathologically enlarged mediastinal or hilar lymph nodes. Esophagus is unremarkable in appearance. No axillary lymphadenopathy.  Lungs/Pleura: Nodular areas of bilateral apical pleuroparenchymal thickening are noted, most compatible with chronic post infectious or inflammatory scarring. A few other scattered 2-4 mm pulmonary nodules are noted throughout the lungs bilaterally, highly nonspecific, but statistically  likely benign. No larger more suspicious appearing pulmonary nodules or masses are noted. No acute consolidative airspace disease. No pleural effusions.  Musculoskeletal/Soft Tissues: There are no aggressive appearing lytic or blastic lesions noted in the visualized portions of the skeleton.  CTA ABDOMEN AND PELVIS FINDINGS  Hepatobiliary: There are 2 well-defined low-attenuation lesions in the liver, largest of which is in the central aspect of segment 5 measuring up to 2.4 cm, compatible with simple cysts. No aggressive appearing hepatic lesions are noted. No intra or extrahepatic biliary ductal dilatation. Gallbladder is normal in appearance.  Pancreas: 1.3 x 0.7 cm low-attenuation lesion in the body of the pancreas is nonspecific (axial image 148 of series 401). No larger more suspicious appearing pancreatic mass is noted. No pancreatic ductal dilatation. No peripancreatic fluid or inflammatory changes.  Spleen: Unremarkable.  Adrenals/Urinary Tract: Bilateral kidneys and bilateral adrenal glands are normal in appearance. No hydroureteronephrosis. Urinary bladder is  normal in appearance.  Stomach/Bowel: Normal appearance of the stomach. No pathologic dilatation of small bowel or colon. Numerous colonic diverticulae are noted, particularly in the sigmoid colon, without surrounding inflammatory changes to suggest an acute diverticulitis at this time. The appendix is not confidently identified and may be surgically absent. Regardless, there are no inflammatory changes noted adjacent to the cecum to suggest the presence of an acute appendicitis at this time.  Vascular/Lymphatic: Aortic atherosclerosis, with vascular findings and measurements pertinent to potential TAVR procedure, as detailed below. No aneurysm or dissection noted in the abdominal or pelvic vasculature. Celiac axis, superior mesenteric artery and inferior mesenteric artery are all widely patent without  hemodynamically significant stenosis. Single renal arteries bilaterally are both widely patent. No lymphadenopathy noted in the abdomen or pelvis.  Reproductive: Fiducial markers in the prostate gland. Seminal vesicles are unremarkable in appearance.  Other: No significant volume of ascites.  No pneumoperitoneum.  Musculoskeletal: There are no aggressive appearing lytic or blastic lesions noted in the visualized portions of the skeleton.  VASCULAR MEASUREMENTS PERTINENT TO TAVR:  AORTA:  Minimal Aortic Diameter -  15 x 17 mm  Severity of Aortic Calcification -  mild  RIGHT PELVIS:  Right Common Iliac Artery -  Minimal Diameter - 9.5 x 9.3 mm  Tortuosity - mild  Calcification - mild  Right External Iliac Artery -  Minimal Diameter - 8.1 x 8.1 mm  Tortuosity - moderate  Calcification - none  Right Common Femoral Artery -  Minimal Diameter - 9.0 x 9.2 mm  Tortuosity - mild  Calcification - none  LEFT PELVIS:  Left Common Iliac Artery -  Minimal Diameter - 11.0 x 11.2 mm  Tortuosity - mild  Calcification - none  Left External Iliac Artery -  Minimal Diameter - 8.7 x 8.3 mm  Tortuosity - moderate to severe  Calcification - none  Left Common Femoral Artery -  Minimal Diameter - 9.2 x 9.3 mm  Tortuosity - mild  Calcification - none  Review of the MIP images confirms the above findings.  IMPRESSION: 1. Vascular findings and measurements pertinent to potential TAVR procedure, as detailed above. Patient does appear to have suitable pelvic arterial access bilaterally. 2. Severe thickening calcification of the aortic valve, compatible with the reported clinical history of severe aortic stenosis. 3. Multiple tiny 2-4 mm pulmonary nodules scattered throughout the lungs bilaterally, nonspecific but statistically likely benign. No follow-up needed if patient is low-risk (and has no known or suspected primary  neoplasm). Non-contrast chest CT can be considered in 12 months if patient is high-risk. This recommendation follows the consensus statement: Guidelines for Management of Incidental Pulmonary Nodules Detected on CT Images: From the Fleischner Society 2017; Radiology 2017; 284:228-243. 4. Severe colonic diverticulosis without evidence to suggest an acute diverticulitis at this time. 5. **An incidental finding of potential clinical significance has been found. 1.3 x 0.7 cm low-attenuation lesion in the mid body of the pancreas. This is nonspecific, but warrants attention on follow-up studies. Repeat pancreatic protocol CT or MRI of the abdomen with and without IV gadolinium with MRCP is recommended in 2 years to ensure the stability of this lesion. This recommendation follows ACR consensus guidelines: Management of Incidental Pancreatic Cysts: A White Paper of the ACR Incidental Findings Committee. [place] [id number].** 6. Additional incidental findings, as above.   Electronically Signed   By: Vinnie Langton M.D.   On: 12/07/2016 08:06       RISK SCORES About the  STS Risk Calculator Procedure: AV Replacement + CAB  Risk of Mortality: 4.206%  Morbidity or Mortality: 24.395%  Long Length of Stay: 12.839%  Short Length of Stay: 17.139%  Permanent Stroke: 1.809%  Prolonged Ventilation: 13.967%  DSW Infection: 0.25%  Renal Failure: 6.861%  Reoperation: 10.453%    Impression:  This 83 year old gentleman has stage D severe symptomatic low gradient, low EF aortic stenosis with NYHA class II symptoms of dyspnea and fatigue with mild moderate exertion. I have personally reviewed his echo, cath and dobutamine stress echo. He has a trileaflet valve with marked calcification of all three leaflets and restricted mobility. The valve looks severely stenotic but the mean gradient has remained within the moderate range as the LVEF has progressively deteriorated. His  dobutamine stress echo shows significant contractile reserve with a doubling of cardiac output and a rise in the mean gradient to 48 mm Hg at peak dobutamine suggesting that he is likely to have an improvement of LV function with AVR. He has non-obstructive CAD with a 50% OM stenosis. I think AVR is indicated to prevent further LV deterioration and progression of heart failure symptoms. His risk for open surgical AVR would be at least moderately increased due to his advanced age and poor EF. I think TAVR would be the best option for him.   The patient and his wife, son, and daughter were counseled at length regarding treatment alternatives for management of severe symptomatic aortic stenosis. The risks and benefits of surgical intervention has been discussed in detail. Long-term prognosis with medical therapy was discussed. Alternative approaches such as conventional surgical aortic valve replacement, transcatheter aortic valve replacement, and palliative medical therapy were compared and contrasted at length. This discussion was placed in the context of the patient's own specific clinical presentation and past medical history. All of their questions been addressed.   The patient and his family have been advised of a variety of complications that might develop including but not limited to risks of death, stroke, paravalvular leak, aortic dissection or other major vascular complications, aortic annulus rupture, device embolization, cardiac rupture or perforation, mitral regurgitation, acute myocardial infarction, arrhythmia, heart block or bradycardia requiring permanent pacemaker placement, congestive heart failure, respiratory failure, renal failure, pneumonia, infection, other late complications related to structural valve deterioration or migration, or other complications that might ultimately cause a temporary or permanent loss of functional independence or other long term morbidity. The patient provides  full informed consent for the procedure as described and all questions were answered.     Plan:  Transfemoral TAVR   Gaye Pollack, MD

## 2016-12-08 ENCOUNTER — Encounter (HOSPITAL_COMMUNITY): Payer: Self-pay | Admitting: *Deleted

## 2016-12-08 ENCOUNTER — Inpatient Hospital Stay (HOSPITAL_COMMUNITY): Payer: Medicare Other | Admitting: Certified Registered"

## 2016-12-08 ENCOUNTER — Inpatient Hospital Stay (HOSPITAL_COMMUNITY)
Admission: RE | Admit: 2016-12-08 | Discharge: 2016-12-10 | DRG: 267 | Disposition: A | Discharge status: Home / Self Care | Payer: Medicare Other | Source: Ambulatory Visit | Attending: Cardiovascular Disease | Admitting: Cardiovascular Disease

## 2016-12-08 ENCOUNTER — Ambulatory Visit (HOSPITAL_COMMUNITY): Payer: Medicare Other

## 2016-12-08 ENCOUNTER — Encounter (HOSPITAL_COMMUNITY)
Admission: RE | Disposition: A | Discharge status: Home / Self Care | Payer: Self-pay | Source: Ambulatory Visit | Attending: Cardiovascular Disease

## 2016-12-08 ENCOUNTER — Inpatient Hospital Stay (HOSPITAL_COMMUNITY): Payer: Medicare Other

## 2016-12-08 DIAGNOSIS — Z923 Personal history of irradiation: Secondary | ICD-10-CM | POA: Diagnosis not present

## 2016-12-08 DIAGNOSIS — E7800 Pure hypercholesterolemia, unspecified: Secondary | ICD-10-CM | POA: Diagnosis present

## 2016-12-08 DIAGNOSIS — Z006 Encounter for examination for normal comparison and control in clinical research program: Secondary | ICD-10-CM

## 2016-12-08 DIAGNOSIS — Z79899 Other long term (current) drug therapy: Secondary | ICD-10-CM | POA: Diagnosis not present

## 2016-12-08 DIAGNOSIS — Z8249 Family history of ischemic heart disease and other diseases of the circulatory system: Secondary | ICD-10-CM | POA: Diagnosis not present

## 2016-12-08 DIAGNOSIS — Z823 Family history of stroke: Secondary | ICD-10-CM | POA: Diagnosis not present

## 2016-12-08 DIAGNOSIS — I083 Combined rheumatic disorders of mitral, aortic and tricuspid valves: Secondary | ICD-10-CM | POA: Diagnosis not present

## 2016-12-08 DIAGNOSIS — Z820 Family history of epilepsy and other diseases of the nervous system: Secondary | ICD-10-CM

## 2016-12-08 DIAGNOSIS — Z8546 Personal history of malignant neoplasm of prostate: Secondary | ICD-10-CM

## 2016-12-08 DIAGNOSIS — Z801 Family history of malignant neoplasm of trachea, bronchus and lung: Secondary | ICD-10-CM | POA: Diagnosis not present

## 2016-12-08 DIAGNOSIS — E785 Hyperlipidemia, unspecified: Secondary | ICD-10-CM | POA: Diagnosis not present

## 2016-12-08 DIAGNOSIS — I2510 Atherosclerotic heart disease of native coronary artery without angina pectoris: Secondary | ICD-10-CM | POA: Diagnosis present

## 2016-12-08 DIAGNOSIS — I447 Left bundle-branch block, unspecified: Secondary | ICD-10-CM | POA: Diagnosis present

## 2016-12-08 DIAGNOSIS — Z952 Presence of prosthetic heart valve: Secondary | ICD-10-CM | POA: Diagnosis not present

## 2016-12-08 DIAGNOSIS — I10 Essential (primary) hypertension: Secondary | ICD-10-CM | POA: Diagnosis present

## 2016-12-08 DIAGNOSIS — I350 Nonrheumatic aortic (valve) stenosis: Secondary | ICD-10-CM | POA: Diagnosis not present

## 2016-12-08 DIAGNOSIS — D649 Anemia, unspecified: Secondary | ICD-10-CM | POA: Diagnosis not present

## 2016-12-08 DIAGNOSIS — I429 Cardiomyopathy, unspecified: Secondary | ICD-10-CM | POA: Diagnosis present

## 2016-12-08 DIAGNOSIS — I712 Thoracic aortic aneurysm, without rupture: Secondary | ICD-10-CM | POA: Diagnosis not present

## 2016-12-08 HISTORY — PX: TEE WITHOUT CARDIOVERSION: SHX5443

## 2016-12-08 HISTORY — PX: TRANSCATHETER AORTIC VALVE REPLACEMENT, TRANSFEMORAL: SHX6400

## 2016-12-08 LAB — POCT I-STAT, CHEM 8
BUN: 17 mg/dL (ref 6–20)
BUN: 17 mg/dL (ref 6–20)
CHLORIDE: 104 mmol/L (ref 101–111)
CHLORIDE: 104 mmol/L (ref 101–111)
CREATININE: 0.9 mg/dL (ref 0.61–1.24)
Calcium, Ion: 1.24 mmol/L (ref 1.15–1.40)
Calcium, Ion: 1.25 mmol/L (ref 1.15–1.40)
Creatinine, Ser: 0.8 mg/dL (ref 0.61–1.24)
Glucose, Bld: 99 mg/dL (ref 65–99)
Glucose, Bld: 138 mg/dL — ABNORMAL HIGH (ref 65–99)
HEMATOCRIT: 34 % — AB (ref 39.0–52.0)
HEMATOCRIT: 33 % — AB (ref 39.0–52.0)
HEMOGLOBIN: 11.2 g/dL — AB (ref 13.0–17.0)
Hemoglobin: 11.6 g/dL — ABNORMAL LOW (ref 13.0–17.0)
POTASSIUM: 4.1 mmol/L (ref 3.5–5.1)
POTASSIUM: 4.2 mmol/L (ref 3.5–5.1)
SODIUM: 139 mmol/L (ref 135–145)
Sodium: 142 mmol/L (ref 135–145)
TCO2: 26 mmol/L (ref 0–100)
TCO2: 24 mmol/L (ref 0–100)

## 2016-12-08 LAB — POCT I-STAT 3, ART BLOOD GAS (G3+)
ACID-BASE DEFICIT: 2 mmol/L (ref 0.0–2.0)
BICARBONATE: 21.8 mmol/L (ref 20.0–28.0)
O2 Saturation: 100 %
TCO2: 23 mmol/L (ref 0–100)
pCO2 arterial: 32 mmHg (ref 32.0–48.0)
pH, Arterial: 7.436 (ref 7.350–7.450)
pO2, Arterial: 184 mmHg — ABNORMAL HIGH (ref 83.0–108.0)

## 2016-12-08 LAB — CBC
HCT: 34.1 % — ABNORMAL LOW (ref 39.0–52.0)
Hemoglobin: 11.3 g/dL — ABNORMAL LOW (ref 13.0–17.0)
MCH: 29.4 pg (ref 26.0–34.0)
MCHC: 33.1 g/dL (ref 30.0–36.0)
MCV: 88.8 fL (ref 78.0–100.0)
PLATELETS: 161 10*3/uL (ref 150–400)
RBC: 3.84 MIL/uL — AB (ref 4.22–5.81)
RDW: 14.1 % (ref 11.5–15.5)
WBC: 7.1 10*3/uL (ref 4.0–10.5)

## 2016-12-08 LAB — PROTIME-INR
INR: 1.31
PROTHROMBIN TIME: 16.4 s — AB (ref 11.4–15.2)

## 2016-12-08 LAB — POCT I-STAT 4, (NA,K, GLUC, HGB,HCT)
Glucose, Bld: 119 mg/dL — ABNORMAL HIGH (ref 65–99)
HCT: 32 % — ABNORMAL LOW (ref 39.0–52.0)
Hemoglobin: 10.9 g/dL — ABNORMAL LOW (ref 13.0–17.0)
POTASSIUM: 4.2 mmol/L (ref 3.5–5.1)
Sodium: 140 mmol/L (ref 135–145)

## 2016-12-08 LAB — APTT: APTT: 47 s — AB (ref 24–36)

## 2016-12-08 SURGERY — IMPLANTATION, AORTIC VALVE, TRANSCATHETER, FEMORAL APPROACH
Anesthesia: General

## 2016-12-08 MED ORDER — MIDAZOLAM HCL 2 MG/2ML IJ SOLN: 2.0000 mg | INTRAMUSCULAR | Status: DC | PRN

## 2016-12-08 MED ORDER — ACETAMINOPHEN 500 MG PO TABS
1000.0000 mg | ORAL_TABLET | Freq: Four times a day (QID) | ORAL | Status: DC
  Administered 2016-12-08 – 2016-12-09 (×2): 1000 mg via ORAL
  Filled 2016-12-08 (×3): qty 2

## 2016-12-08 MED ORDER — HEPARIN SODIUM (PORCINE) 1000 UNIT/ML IJ SOLN
INTRAMUSCULAR | Status: DC | PRN
  Administered 2016-12-08: 11000 [IU] via INTRAVENOUS

## 2016-12-08 MED ORDER — MORPHINE SULFATE (PF) 2 MG/ML IV SOLN: 2.0000 mg | INTRAVENOUS | Status: DC | PRN

## 2016-12-08 MED ORDER — SODIUM CHLORIDE 0.9 % IV SOLN
INTRAVENOUS | Status: AC
  Administered 2016-12-08: 50 mL/h via INTRAVENOUS

## 2016-12-08 MED ORDER — ORAL CARE MOUTH RINSE: 15.0000 mL | Freq: Two times a day (BID) | OROMUCOSAL | Status: DC

## 2016-12-08 MED ORDER — SODIUM CHLORIDE 0.9 % IV SOLN
0.1000 ug/kg/h | INTRAVENOUS | Status: DC
  Filled 2016-12-08: qty 2

## 2016-12-08 MED ORDER — FENTANYL CITRATE (PF) 100 MCG/2ML IJ SOLN: 25.0000 ug | INTRAMUSCULAR | Status: DC | PRN

## 2016-12-08 MED ORDER — CHLORHEXIDINE GLUCONATE 4 % EX LIQD: 60.0000 mL | Freq: Once | CUTANEOUS | Status: DC

## 2016-12-08 MED ORDER — SIMVASTATIN 10 MG PO TABS
5.0000 mg | ORAL_TABLET | Freq: Every day | ORAL | Status: DC
  Administered 2016-12-09: 5 mg via ORAL
  Filled 2016-12-08 (×3): qty 1

## 2016-12-08 MED ORDER — PHENYLEPHRINE HCL 10 MG/ML IJ SOLN
INTRAMUSCULAR | Status: DC | PRN
  Administered 2016-12-08 (×2): 80 ug via INTRAVENOUS

## 2016-12-08 MED ORDER — PROPOFOL 10 MG/ML IV BOLUS
INTRAVENOUS | Status: AC
  Filled 2016-12-08: qty 20

## 2016-12-08 MED ORDER — ASPIRIN EC 325 MG PO TBEC
325.0000 mg | DELAYED_RELEASE_TABLET | Freq: Every day | ORAL | Status: DC
  Filled 2016-12-08: qty 1

## 2016-12-08 MED ORDER — ROCURONIUM BROMIDE 100 MG/10ML IV SOLN
INTRAVENOUS | Status: DC | PRN
  Administered 2016-12-08: 50 mg via INTRAVENOUS

## 2016-12-08 MED ORDER — PHENYLEPHRINE HCL 10 MG/ML IJ SOLN
INTRAVENOUS | Status: DC | PRN
  Administered 2016-12-08: 10 ug/min via INTRAVENOUS

## 2016-12-08 MED ORDER — ONDANSETRON HCL 4 MG/2ML IJ SOLN
INTRAMUSCULAR | Status: DC | PRN
  Administered 2016-12-08: 4 mg via INTRAVENOUS

## 2016-12-08 MED ORDER — MORPHINE SULFATE (PF) 4 MG/ML IV SOLN: 1.0000 mg | INTRAVENOUS | Status: DC | PRN

## 2016-12-08 MED ORDER — CHLORHEXIDINE GLUCONATE 0.12 % MT SOLN
15.0000 mL | OROMUCOSAL | Status: AC
  Administered 2016-12-08: 15 mL via OROMUCOSAL

## 2016-12-08 MED ORDER — SUGAMMADEX SODIUM 200 MG/2ML IV SOLN
INTRAVENOUS | Status: DC | PRN
  Administered 2016-12-08: 150 mg via INTRAVENOUS

## 2016-12-08 MED ORDER — ALBUMIN HUMAN 5 % IV SOLN
INTRAVENOUS | Status: DC | PRN
  Administered 2016-12-08 (×2): via INTRAVENOUS

## 2016-12-08 MED ORDER — MIDAZOLAM HCL 2 MG/2ML IJ SOLN
INTRAMUSCULAR | Status: AC
  Filled 2016-12-08: qty 2

## 2016-12-08 MED ORDER — OXYCODONE HCL 5 MG PO TABS: 5.0000 mg | ORAL_TABLET | ORAL | Status: DC | PRN

## 2016-12-08 MED ORDER — LACTATED RINGERS IV SOLN
INTRAVENOUS | Status: DC
  Administered 2016-12-08: 09:00:00 via INTRAVENOUS

## 2016-12-08 MED ORDER — CHLORHEXIDINE GLUCONATE 4 % EX LIQD
60.0000 mL | Freq: Once | CUTANEOUS | Status: DC
Start: 2016-12-08 — End: 2016-12-08

## 2016-12-08 MED ORDER — FENTANYL CITRATE (PF) 100 MCG/2ML IJ SOLN
INTRAMUSCULAR | Status: AC
  Administered 2016-12-08: 100 ug via INTRAVENOUS
  Filled 2016-12-08: qty 2

## 2016-12-08 MED ORDER — PROPOFOL 10 MG/ML IV BOLUS
INTRAVENOUS | Status: DC | PRN
  Administered 2016-12-08: 20 mg via INTRAVENOUS
  Administered 2016-12-08 (×2): 30 mg via INTRAVENOUS

## 2016-12-08 MED ORDER — NOREPINEPHRINE BITARTRATE 1 MG/ML IV SOLN
INTRAVENOUS | Status: DC | PRN
  Administered 2016-12-08: 2 ug/min via INTRAVENOUS

## 2016-12-08 MED ORDER — CHLORHEXIDINE GLUCONATE 4 % EX LIQD: 30.0000 mL | CUTANEOUS | Status: DC

## 2016-12-08 MED ORDER — ACETAMINOPHEN 160 MG/5ML PO SOLN
650.0000 mg | Freq: Once | ORAL | Status: AC
  Administered 2016-12-08: 650 mg
  Filled 2016-12-08: qty 20.3

## 2016-12-08 MED ORDER — CALCIUM CHLORIDE 10 % IV SOLN
INTRAVENOUS | Status: DC | PRN
  Administered 2016-12-08 (×2): 100 mg via INTRAVENOUS

## 2016-12-08 MED ORDER — FAMOTIDINE IN NACL 20-0.9 MG/50ML-% IV SOLN: 20.0000 mg | Freq: Two times a day (BID) | INTRAVENOUS | Status: DC

## 2016-12-08 MED ORDER — ASPIRIN 81 MG PO CHEW: 324.0000 mg | CHEWABLE_TABLET | Freq: Every day | ORAL | Status: DC

## 2016-12-08 MED ORDER — ONDANSETRON HCL 4 MG/2ML IJ SOLN: 4.0000 mg | Freq: Four times a day (QID) | INTRAMUSCULAR | Status: DC | PRN

## 2016-12-08 MED ORDER — SUGAMMADEX SODIUM 200 MG/2ML IV SOLN
INTRAVENOUS | Status: AC
  Filled 2016-12-08: qty 2

## 2016-12-08 MED ORDER — TAMSULOSIN HCL 0.4 MG PO CAPS
0.4000 mg | ORAL_CAPSULE | Freq: Every day | ORAL | Status: DC
  Administered 2016-12-08 – 2016-12-09 (×2): 0.4 mg via ORAL
  Filled 2016-12-08 (×2): qty 1

## 2016-12-08 MED ORDER — REMIFENTANIL HCL 1 MG IV SOLR
INTRAVENOUS | Status: DC | PRN
  Administered 2016-12-08: .5 ug/kg/min via INTRAVENOUS

## 2016-12-08 MED ORDER — OXYCODONE HCL 5 MG PO TABS: 5.0000 mg | ORAL_TABLET | Freq: Once | ORAL | Status: DC | PRN

## 2016-12-08 MED ORDER — HEPARIN SODIUM (PORCINE) 1000 UNIT/ML IJ SOLN
INTRAMUSCULAR | Status: AC
  Filled 2016-12-08: qty 1

## 2016-12-08 MED ORDER — LACTATED RINGERS IV SOLN
INTRAVENOUS | Status: DC | PRN
  Administered 2016-12-08: 10:00:00 via INTRAVENOUS

## 2016-12-08 MED ORDER — CEFUROXIME SODIUM 1.5 G IJ SOLR
1.5000 g | Freq: Two times a day (BID) | INTRAMUSCULAR | Status: AC
  Administered 2016-12-08 – 2016-12-10 (×4): 1.5 g via INTRAVENOUS
  Filled 2016-12-08 (×5): qty 1.5

## 2016-12-08 MED ORDER — MORPHINE SULFATE (PF) 4 MG/ML IV SOLN: 2.0000 mg | INTRAVENOUS | Status: DC | PRN

## 2016-12-08 MED ORDER — OXYCODONE HCL 5 MG/5ML PO SOLN: 5.0000 mg | Freq: Once | ORAL | Status: DC | PRN

## 2016-12-08 MED ORDER — VANCOMYCIN HCL IN DEXTROSE 1-5 GM/200ML-% IV SOLN
1000.0000 mg | Freq: Once | INTRAVENOUS | Status: AC
  Administered 2016-12-08: 1000 mg via INTRAVENOUS
  Filled 2016-12-08: qty 200

## 2016-12-08 MED ORDER — 0.9 % SODIUM CHLORIDE (POUR BTL) OPTIME
TOPICAL | Status: DC | PRN
  Administered 2016-12-08: 1000 mL

## 2016-12-08 MED ORDER — ACETAMINOPHEN 650 MG RE SUPP: 650.0000 mg | Freq: Once | RECTAL | Status: AC

## 2016-12-08 MED ORDER — PROTAMINE SULFATE 10 MG/ML IV SOLN
INTRAVENOUS | Status: DC | PRN
  Administered 2016-12-08: 110 mg via INTRAVENOUS

## 2016-12-08 MED ORDER — ROCURONIUM BROMIDE 50 MG/5ML IV SOSY
PREFILLED_SYRINGE | INTRAVENOUS | Status: AC
  Filled 2016-12-08: qty 5

## 2016-12-08 MED ORDER — FENTANYL CITRATE (PF) 100 MCG/2ML IJ SOLN
100.0000 ug | Freq: Once | INTRAMUSCULAR | Status: AC
Start: 2016-12-08 — End: 2016-12-08
  Administered 2016-12-08: 100 ug via INTRAVENOUS
  Filled 2016-12-08: qty 2

## 2016-12-08 MED ORDER — LACTATED RINGERS IV SOLN: 500.0000 mL | Freq: Once | INTRAVENOUS | Status: DC | PRN

## 2016-12-08 MED ORDER — CLOPIDOGREL BISULFATE 75 MG PO TABS
75.0000 mg | ORAL_TABLET | Freq: Every day | ORAL | Status: DC
  Administered 2016-12-09 – 2016-12-10 (×2): 75 mg via ORAL
  Filled 2016-12-08 (×2): qty 1

## 2016-12-08 MED ORDER — ONDANSETRON HCL 4 MG/2ML IJ SOLN
INTRAMUSCULAR | Status: AC
  Filled 2016-12-08: qty 2

## 2016-12-08 MED ORDER — PANTOPRAZOLE SODIUM 40 MG PO TBEC
40.0000 mg | DELAYED_RELEASE_TABLET | Freq: Every day | ORAL | Status: DC
  Administered 2016-12-10: 40 mg via ORAL
  Filled 2016-12-08: qty 1

## 2016-12-08 MED ORDER — ALBUMIN HUMAN 5 % IV SOLN: 250.0000 mL | INTRAVENOUS | Status: DC | PRN

## 2016-12-08 MED ORDER — MORPHINE SULFATE (PF) 2 MG/ML IV SOLN: 1.0000 mg | INTRAVENOUS | Status: DC | PRN

## 2016-12-08 MED ORDER — PROTAMINE SULFATE 10 MG/ML IV SOLN
INTRAVENOUS | Status: AC
  Filled 2016-12-08: qty 25

## 2016-12-08 MED ORDER — SODIUM CHLORIDE 0.9 % IV SOLN
INTRAVENOUS | Status: DC | PRN
  Administered 2016-12-08: 500 mL

## 2016-12-08 MED ORDER — TRAMADOL HCL 50 MG PO TABS: 50.0000 mg | ORAL_TABLET | ORAL | Status: DC | PRN

## 2016-12-08 MED ORDER — ACETAMINOPHEN 160 MG/5ML PO SOLN: 1000.0000 mg | Freq: Four times a day (QID) | ORAL | Status: DC

## 2016-12-08 MED FILL — Heparin Sodium (Porcine) Inj 1000 Unit/ML: INTRAMUSCULAR | Qty: 30 | Status: AC

## 2016-12-08 MED FILL — Potassium Chloride Inj 2 mEq/ML: INTRAVENOUS | Qty: 40 | Status: AC

## 2016-12-08 MED FILL — Magnesium Sulfate Inj 50%: INTRAMUSCULAR | Qty: 10 | Status: AC

## 2016-12-08 SURGICAL SUPPLY — 97 items
ADAPTER UNIV SWAN GANZ BIP (ADAPTER) ×1 IMPLANT
ADAPTER UNV SWAN GANZ BIP (ADAPTER) ×2
ADH SKN CLS APL DERMABOND .7 (GAUZE/BANDAGES/DRESSINGS) ×2
ADPR CATH UNV NS SG CATH (ADAPTER) ×1
BAG BANDED W/RUBBER/TAPE 36X54 (MISCELLANEOUS) ×3 IMPLANT
BAG DECANTER FOR FLEXI CONT (MISCELLANEOUS) IMPLANT
BAG EQP BAND 135X91 W/RBR TAPE (MISCELLANEOUS) ×1
BAG SNAP BAND KOVER 36X36 (MISCELLANEOUS) ×6 IMPLANT
BLADE CLIPPER SURG (BLADE) IMPLANT
BLADE OSCILLATING /SAGITTAL (BLADE) IMPLANT
BLADE STERNUM SYSTEM 6 (BLADE) ×3 IMPLANT
CABLE PACING FASLOC BIEGE (MISCELLANEOUS) ×3 IMPLANT
CABLE PACING FASLOC BLUE (MISCELLANEOUS) ×3 IMPLANT
CANNULA FEM VENOUS REMOTE 22FR (CANNULA) IMPLANT
CANNULA OPTISITE PERFUSION 16F (CANNULA) IMPLANT
CANNULA OPTISITE PERFUSION 18F (CANNULA) IMPLANT
CATH DIAG EXPO 6F VENT PIG 145 (CATHETERS) ×6 IMPLANT
CATH EXPO 5FR AL1 (CATHETERS) ×3 IMPLANT
CATH S G BIP PACING (SET/KITS/TRAYS/PACK) ×6 IMPLANT
CLIP TI MEDIUM 24 (CLIP) ×3 IMPLANT
CLIP TI WIDE RED SMALL 24 (CLIP) ×3 IMPLANT
CONT SPEC 4OZ CLIKSEAL STRL BL (MISCELLANEOUS) ×6 IMPLANT
COVER BACK TABLE 24X17X13 BIG (DRAPES) ×3 IMPLANT
COVER BACK TABLE 60X90IN (DRAPES) ×3 IMPLANT
COVER BACK TABLE 80X110 HD (DRAPES) ×3 IMPLANT
COVER DOME SNAP 22 D (MISCELLANEOUS) ×3 IMPLANT
COVER MAYO STAND STRL (DRAPES) ×3 IMPLANT
CRADLE DONUT ADULT HEAD (MISCELLANEOUS) ×3 IMPLANT
DERMABOND ADVANCED (GAUZE/BANDAGES/DRESSINGS) ×4
DERMABOND ADVANCED .7 DNX12 (GAUZE/BANDAGES/DRESSINGS) ×1 IMPLANT
DEVICE CLOSURE PERCLS PRGLD 6F (VASCULAR PRODUCTS) IMPLANT
DRAPE INCISE IOBAN 66X45 STRL (DRAPES) IMPLANT
DRAPE SLUSH MACHINE 52X66 (DRAPES) ×3 IMPLANT
DRSG TEGADERM 4X4.75 (GAUZE/BANDAGES/DRESSINGS) ×5 IMPLANT
ELECT REM PT RETURN 9FT ADLT (ELECTROSURGICAL) ×6
ELECTRODE REM PT RTRN 9FT ADLT (ELECTROSURGICAL) ×2 IMPLANT
FELT TEFLON 6X6 (MISCELLANEOUS) ×3 IMPLANT
FEMORAL VENOUS CANN RAP (CANNULA) IMPLANT
GAUZE SPONGE 2X2 8PLY NS (GAUZE/BANDAGES/DRESSINGS) ×4 IMPLANT
GAUZE SPONGE 4X4 12PLY STRL (GAUZE/BANDAGES/DRESSINGS) ×3 IMPLANT
GLOVE BIO SURGEON STRL SZ8 (GLOVE) ×6 IMPLANT
GLOVE EUDERMIC 7 POWDERFREE (GLOVE) ×6 IMPLANT
GLOVE ORTHO TXT STRL SZ7.5 (GLOVE) ×6 IMPLANT
GOWN STRL REUS W/ TWL LRG LVL3 (GOWN DISPOSABLE) ×3 IMPLANT
GOWN STRL REUS W/ TWL XL LVL3 (GOWN DISPOSABLE) ×6 IMPLANT
GOWN STRL REUS W/TWL LRG LVL3 (GOWN DISPOSABLE) ×9
GOWN STRL REUS W/TWL XL LVL3 (GOWN DISPOSABLE) ×18
GUIDEWIRE SAF TJ AMPL .035X180 (WIRE) ×3 IMPLANT
GUIDEWIRE SAFE TJ AMPLATZ EXST (WIRE) ×3 IMPLANT
GUIDEWIRE STRAIGHT .035 260CM (WIRE) ×3 IMPLANT
INSERT FOGARTY 61MM (MISCELLANEOUS) ×3 IMPLANT
INSERT FOGARTY SM (MISCELLANEOUS) IMPLANT
INSERT FOGARTY XLG (MISCELLANEOUS) IMPLANT
KIT BASIN OR (CUSTOM PROCEDURE TRAY) ×3 IMPLANT
KIT DILATOR VASC 18G NDL (KITS) IMPLANT
KIT HEART LEFT (KITS) ×3 IMPLANT
KIT ROOM TURNOVER OR (KITS) ×3 IMPLANT
KIT SUCTION CATH 14FR (SUCTIONS) ×6 IMPLANT
NDL PERC 18GX7CM (NEEDLE) ×1 IMPLANT
NEEDLE PERC 18GX7CM (NEEDLE) ×3 IMPLANT
NS IRRIG 1000ML POUR BTL (IV SOLUTION) ×9 IMPLANT
PACK AORTA (CUSTOM PROCEDURE TRAY) ×3 IMPLANT
PAD ARMBOARD 7.5X6 YLW CONV (MISCELLANEOUS) ×6 IMPLANT
PAD ELECT DEFIB RADIOL ZOLL (MISCELLANEOUS) ×3 IMPLANT
PERCLOSE PROGLIDE 6F (VASCULAR PRODUCTS)
SET MICROPUNCTURE 5F STIFF (MISCELLANEOUS) ×3 IMPLANT
SHEATH AVANTI 11CM 8FR (MISCELLANEOUS) ×3 IMPLANT
SHEATH PINNACLE 6F 10CM (SHEATH) ×6 IMPLANT
SLEEVE REPOSITIONING LENGTH 30 (MISCELLANEOUS) ×3 IMPLANT
SPONGE LAP 4X18 X RAY DECT (DISPOSABLE) ×3 IMPLANT
STOPCOCK MORSE 400PSI 3WAY (MISCELLANEOUS) ×18 IMPLANT
SUT ETHIBOND X763 2 0 SH 1 (SUTURE) IMPLANT
SUT GORETEX CV 4 TH 22 36 (SUTURE) IMPLANT
SUT GORETEX CV4 TH-18 (SUTURE) IMPLANT
SUT GORETEX TH-18 36 INCH (SUTURE) IMPLANT
SUT PROLENE 3 0 SH1 36 (SUTURE) IMPLANT
SUT PROLENE 4 0 RB 1 (SUTURE)
SUT PROLENE 4-0 RB1 .5 CRCL 36 (SUTURE) IMPLANT
SUT PROLENE 5 0 C 1 36 (SUTURE) IMPLANT
SUT PROLENE 6 0 C 1 30 (SUTURE) IMPLANT
SUT SILK  1 MH (SUTURE) ×2
SUT SILK 1 MH (SUTURE) ×1 IMPLANT
SUT SILK 2 0 SH CR/8 (SUTURE) IMPLANT
SUT VIC AB 2-0 CT1 27 (SUTURE)
SUT VIC AB 2-0 CT1 TAPERPNT 27 (SUTURE) IMPLANT
SUT VIC AB 2-0 CTX 36 (SUTURE) IMPLANT
SUT VIC AB 3-0 SH 8-18 (SUTURE) IMPLANT
SYR 10ML LL (SYRINGE) ×9 IMPLANT
SYR 30ML LL (SYRINGE) ×6 IMPLANT
SYR 50ML LL SCALE MARK (SYRINGE) ×3 IMPLANT
TOWEL OR 17X26 10 PK STRL BLUE (TOWEL DISPOSABLE) ×6 IMPLANT
TRANSDUCER W/STOPCOCK (MISCELLANEOUS) ×6 IMPLANT
TRAY FOLEY SILVER 16FR TEMP (SET/KITS/TRAYS/PACK) ×3 IMPLANT
TUBING HIGH PRESSURE 120CM (CONNECTOR) ×3 IMPLANT
VALVE HEART TRANSCATH SZ3 29MM (Prosthesis & Implant Heart) ×2 IMPLANT
WIRE AMPLATZ SS-J .035X180CM (WIRE) ×3 IMPLANT
WIRE BENTSON .035X145CM (WIRE) ×3 IMPLANT

## 2016-12-08 NOTE — Anesthesia Procedure Notes (Signed)
Central Venous Catheter Insertion Performed by: Oleta Mouse, anesthesiologist Start/End4/05/2017 10:18 AM, 12/08/2016 1:22 AM Patient location: Pre-op. Preanesthetic checklist: patient identified, IV checked, site marked, risks and benefits discussed, surgical consent, monitors and equipment checked, pre-op evaluation, timeout performed and anesthesia consent Lidocaine 1% used for infiltration and patient sedated Hand hygiene performed  and maximum sterile barriers used  Catheter size: 8 Fr Total catheter length 16. Central line was placed.Double lumen Procedure performed using ultrasound guided technique. Ultrasound Notes:anatomy identified, needle tip was noted to be adjacent to the nerve/plexus identified, no ultrasound evidence of intravascular and/or intraneural injection and image(s) printed for medical record Attempts: 1 Following insertion, dressing applied, line sutured and Biopatch. Post procedure assessment: blood return through all ports, free fluid flow and no air  Patient tolerated the procedure well with no immediate complications.

## 2016-12-08 NOTE — Interval H&P Note (Signed)
History and Physical Interval Note:  12/08/2016 10:07 AM  Jonathan Graham  has presented today for surgery, with the diagnosis of SEVERE AS  The various methods of treatment have been discussed with the patient and family. After consideration of risks, benefits and other options for treatment, the patient has consented to  Procedure(s): TRANSCATHETER AORTIC VALVE REPLACEMENT, TRANSFEMORAL (N/A) TRANSESOPHAGEAL ECHOCARDIOGRAM (TEE) (N/A) as a surgical intervention .  The patient's history has been reviewed, patient examined, no change in status, stable for surgery.  I have reviewed the patient's chart and labs.  Questions were answered to the patient's satisfaction.     Gaye Pollack

## 2016-12-08 NOTE — Op Note (Signed)
[facility]   MULTIDISCIPLINARY HEART VALVE TEAM   TAVR OPERATIVE NOTE   Date of Procedure:  12/08/2016  Preoperative Diagnosis: Severe Aortic Stenosis   Postoperative Diagnosis: Same   Procedure:    Transcatheter Aortic Valve Replacement - Percutaneous Right Transfemoral Approach  Edwards Sapien 3 THV (size 29 mm, model # 9600TFX, serial # [id number])   Co-Surgeons: Gaye Pollack, MD and Lauree Chandler, MD       Anesthesiologist:  Laurie Panda, MD  Echocardiographer:  Ena Dawley, MD  Pre-operative Echo Findings:   severe aortic stenosis    Moderate to severe left ventricular systolic dysfunction   Post-operative Echo Findings:  trivial paravalvular leak  Unchanged moderate to severe left ventricular systolic dysfunction    BRIEF CLINICAL NOTE AND INDICATIONS FOR SURGERY  The patient is an 83 year old gentleman with a history of hypercholesterolemia, LBBB, hx or prostate cancer in 2014 treated with XRT and known moderate AS by echo. His echo in 10/2014 showed a mean gradient of 24 mm Hg and an LVEF of 40-45%. His mean gradient in 07/2012 was 15 mm Hg and the EF was the same. His initial echo in 2007 showed an EF of 55-60%. He says that he has been in his usual state of health and was scheduled for a routine follow up echo on 10/15/2016. This showed a mean AV gradient of 20 mm Hg with a peak of 32. The aortic valve is heavily calcified and trileaflet with restricted mobility. His EF was down to 25-30% with mild MR. A dobutamine stress echo showed significant contractile reserve with a doubling of cardiac output on peak dobutamine and a rise in the mean gradient from 23 to 48 mm Hg. He subsequently underwent a right and left heart cath showing mild nonobstructive CAD with normal right heart pressures and normal LV filling pressure with a mean AV gradient of 26 mm Hg.  This 83 year old gentleman has stage D severe symptomatic low gradient, low EF  aortic stenosis with NYHA class II symptoms of dyspnea and fatigue with mild moderate exertion. I have personally reviewed his echo, cath and dobutamine stress echo. He has a trileaflet valve with marked calcification of all three leaflets and restricted mobility. The valve looks severely stenotic but the mean gradient has remained within the moderate range as the LVEF has progressively deteriorated. His dobutamine stress echo shows significant contractile reserve with a doubling of cardiac output and a rise in the mean gradient to 48 mm Hg at peak dobutamine suggesting that he is likely to have an improvement of LV function with AVR. He has non-obstructive CAD with a 50% OM stenosis. I think AVR is indicated to prevent further LV deterioration and progression of heart failure symptoms. His risk for open surgical AVR would be at least moderately increased due to his advanced age and poor EF. I think TAVR would be the best option for him.   The patient and his wife, son, and daughter were counseled at length regarding treatment alternatives for management of severe symptomatic aortic stenosis. The risks and benefits of surgical intervention has been discussed in detail. Long-term prognosis with medical therapy was discussed. Alternative approaches such as conventional surgical aortic valve replacement, transcatheter aortic valve replacement, and palliative medical therapy were compared and contrasted at length. This discussion was placed in the context of the patient's own specific clinical presentation and past medical history. All of their questions been addressed.   The patient and his  family havebeen advised of a variety of complications that might develop including but not limited to risks of death, stroke, paravalvular leak, aortic dissection or other major vascular complications, aortic annulus rupture, device embolization, cardiac rupture or perforation, mitral regurgitation, acute myocardial  infarction, arrhythmia, heart block or bradycardia requiring permanent pacemaker placement, congestive heart failure, respiratory failure, renal failure, pneumonia, infection, other late complications related to structural valve deterioration or migration, or other complications that might ultimately cause a temporary or permanent loss of functional independence or other long term morbidity. The patient provides full informed consent for the procedure as described and all questions were answered.   DETAILS OF THE OPERATIVE PROCEDURE  PREPARATION:    The patient is brought to the operating room on the above mentioned date and central monitoring was established by the anesthesia team including placement of a radial arterial line. The patient is placed in the supine position on the operating table.  Intravenous antibiotics are administered.  General endotracheal anesthesia is induced uneventfully.  A Foley catheter is placed.  Baseline transesophageal echocardiogram was performed. The patient's chest, abdomen, both groins, and both lower extremities are prepared and draped in a sterile manner. A time out procedure is performed.   Peripheral and transfemoral access was performed by Dr. Angelena Form and will be included in his note.     BALLOON AORTIC VALVULOPLASTY:   Balloon aortic valvuloplasty was performed using a 25 mm valvuloplasty balloon.  Once optimal position was achieved, BAV was done under rapid ventricular pacing. With the balloon inflated an aortogram was performed and there was no AI around the balloon and good flow into the left main coronary artery. The patient recovered well hemodynamically.    TRANSCATHETER HEART VALVE DEPLOYMENT:   An Edwards Sapien 3 transcatheter heart valve (size 29 mm, model #[id number], serial #[id number]) was prepared and crimped per manufacturer's guidelines, and the proper orientation of the valve is confirmed on the [employer] delivery system. The valve  was advanced through the introducer sheath using normal technique until in an appropriate position in the abdominal aorta beyond the sheath tip. The balloon was then retracted and using the fine-tuning wheel was centered on the valve. The valve was then advanced across the aortic arch using appropriate flexion of the catheter. The valve was carefully positioned across the aortic valve annulus. The Commander catheter was retracted using normal technique. Once final position of the valve has been confirmed by angiographic assessment, the valve is deployed while temporarily holding ventilation and during rapid ventricular pacing to maintain systolic blood pressure < 50 mmHg and pulse pressure < 10 mmHg. The balloon inflation is held for >3 seconds after reaching full deployment volume. Once the balloon has fully deflated the balloon is retracted into the ascending aorta and valve function is assessed using echocardiography. There is felt to be trivial paravalvular leak and no central aortic insufficiency.  The patient's hemodynamic recovery following valve deployment is good.  The deployment balloon and guidewire are both removed. Echo demostrated acceptable post-procedural gradients, stable mitral valve function, and trivial aortic insufficiency.   PROCEDURE COMPLETION:   The sheath was removed and femoral artery closure performed by Dr Angelena Form. Please see his separate report for details.  Protamine was administered once femoral arterial repair was complete. The temporary pacemaker, pigtail catheters and femoral sheaths were removed with manual pressure used for hemostasis.   The patient tolerated the procedure well and is transported to the surgical intensive care in stable condition. There were no immediate  intraoperative complications. All sponge, instrument and needle counts are verified correct at completion of the operation.     Gaye Pollack, MD 12/08/2016 1:14 PM

## 2016-12-08 NOTE — Progress Notes (Signed)
  Echocardiogram Echocardiogram Transesophageal has been performed.  Bobbye Charleston 12/08/2016, 12:52 PM

## 2016-12-08 NOTE — Anesthesia Preprocedure Evaluation (Signed)
Anesthesia Evaluation  Patient identified by MRN, date of birth, ID band Patient awake    Reviewed: Allergy & Precautions, NPO status , Patient's Chart, lab work & pertinent test results  Airway Mallampati: II  TM Distance: >3 FB Neck ROM: Full    Dental  (+) Teeth Intact   Pulmonary shortness of breath, former smoker,    breath sounds clear to auscultation       Cardiovascular + Peripheral Vascular Disease and +CHF  + dysrhythmias + Valvular Problems/Murmurs AS  Rhythm:Regular     Neuro/Psych negative neurological ROS  negative psych ROS   GI/Hepatic Neg liver ROS, hiatal hernia,   Endo/Other  negative endocrine ROS  Renal/GU negative Renal ROS     Musculoskeletal  (+) Arthritis ,   Abdominal   Peds  Hematology  (+) anemia ,   Anesthesia Other Findings   Reproductive/Obstetrics                             Anesthesia Physical Anesthesia Plan  ASA: IV  Anesthesia Plan: General   Post-op Pain Management:    Induction: Intravenous  Airway Management Planned: Oral ETT  Additional Equipment: Arterial line, CVP, Ultrasound Guidance Line Placement and TEE  Intra-op Plan:   Post-operative Plan: Extubation in OR  Informed Consent: I have reviewed the patients History and Physical, chart, labs and discussed the procedure including the risks, benefits and alternatives for the proposed anesthesia with the patient or authorized representative who has indicated his/her understanding and acceptance.   Dental advisory given  Plan Discussed with: CRNA and Surgeon  Anesthesia Plan Comments:         Anesthesia Quick Evaluation

## 2016-12-08 NOTE — Transfer of Care (Signed)
Immediate Anesthesia Transfer of Care Note  Patient: Jonathan Graham  Procedure(s) Performed: Procedure(s): TRANSCATHETER AORTIC VALVE REPLACEMENT, TRANSFEMORAL (N/A) TRANSESOPHAGEAL ECHOCARDIOGRAM (TEE) (N/A)  Patient Location: SICU  Anesthesia Type:General  Level of Consciousness: awake, alert , oriented and patient cooperative  Airway & Oxygen Therapy: Patient Spontanous Breathing and Patient connected to face mask oxygen  Post-op Assessment: Report given to RN and Post -op Vital signs reviewed and stable  Post vital signs: Reviewed  Last Vitals:  Vitals:   12/08/16 1134 12/08/16 1223  BP:    Pulse: (!) 49 (!) 48  Resp:    Temp:      Last Pain:  Vitals:   12/08/16 0807  TempSrc: Oral         Complications: No apparent anesthesia complications

## 2016-12-08 NOTE — Anesthesia Procedure Notes (Signed)
Procedure Name: Intubation Date/Time: 12/08/2016 11:22 AM Performed by: Rebekah Chesterfield L Pre-anesthesia Checklist: Patient identified, Emergency Drugs available, Suction available and Patient being monitored Patient Re-evaluated:Patient Re-evaluated prior to inductionOxygen Delivery Method: Circle System Utilized Preoxygenation: Pre-oxygenation with 100% oxygen Intubation Type: IV induction Ventilation: Mask ventilation without difficulty Laryngoscope Size: Mac and 4 Grade View: Grade I Tube type: Oral Number of attempts: 2 Airway Equipment and Method: Stylet and Oral airway Placement Confirmation: ETT inserted through vocal cords under direct vision,  positive ETCO2 and breath sounds checked- equal and bilateral Secured at: 21 cm Tube secured with: Tape Dental Injury: Teeth and Oropharynx as per pre-operative assessment and Injury to lip

## 2016-12-08 NOTE — Anesthesia Postprocedure Evaluation (Addendum)
Anesthesia Post Note  Patient: Jonathan Graham  Procedure(s) Performed: Procedure(s) (LRB): TRANSCATHETER AORTIC VALVE REPLACEMENT, TRANSFEMORAL (N/A) TRANSESOPHAGEAL ECHOCARDIOGRAM (TEE) (N/A)  Patient location during evaluation: ICU Anesthesia Type: General Level of consciousness: awake and alert Pain management: pain level controlled Vital Signs Assessment: post-procedure vital signs reviewed and stable Respiratory status: spontaneous breathing, nonlabored ventilation, respiratory function stable and patient connected to nasal cannula oxygen Cardiovascular status: blood pressure returned to baseline and stable Postop Assessment: no signs of nausea or vomiting Anesthetic complications: no       Last Vitals:  Vitals:   12/08/16 1134 12/08/16 1223  BP:    Pulse: (!) 49 (!) 48  Resp:    Temp:      Last Pain:  Vitals:   12/08/16 0807  TempSrc: Oral                 Jesicca Dipierro

## 2016-12-08 NOTE — CV Procedure (Signed)
[facility]  TAVR OPERATIVE NOTE   Date of Procedure:  12/08/2016  Preoperative Diagnosis: Severe Aortic Stenosis   Postoperative Diagnosis: Same   Procedure:    Transcatheter Aortic Valve Replacement - Transfemoral Approach  Edwards Sapien 3 THV (size 29 mm, model # [id number], serial # [id number])   Co-Surgeons:  Gaye Pollack, MD and Lauree Chandler, MD  Anesthesiologist:  Ermalene Postin  Echocardiographer:  Meda Coffee  Pre-operative Echo Findings:  Severe aortic stenosis  Normal left ventricular systolic function  Post-operative Echo Findings:  Trivial paravalvular leak  Normal left ventricular systolic function  BRIEF CLINICAL NOTE AND INDICATIONS FOR SURGERY  83 yo male with history of HLD, LBBB, prostate cancer, cardiomyopathy, CAD and severe aortic stenosis who is here today for TAVR. He has been followed in our office by Dr. [name] and has had slow progression of his aortic valve disease over the past few years. Echo in February 2018 showed LVEF=25-30% with heavily calcified aortic valve, mean gradient 20 mmHg. Dobutamine stress echo with rise in mean gradient to 48 mmHg. Right and left heart cath 11/13/16 with mild non-obstructive CAD with normal filling pressures and mean gradient of 26 mm Hg across the aortic valve. He has described dyspnea with minimal exertion and dizziness without syncope. No chest pain. Overall lack of energy.   During the course of the patient's preoperative work up they have been evaluated comprehensively by a multidisciplinary team of specialists coordinated through the [facility] in the [facility].  They have been demonstrated to suffer from symptomatic severe aortic stenosis as noted above. The patient has been counseled extensively as to the relative risks and benefits of all options for the treatment of severe aortic stenosis including long term medical therapy, conventional surgery  for aortic valve replacement, and transcatheter aortic valve replacement.  The patient has been independently evaluated by two cardiac surgeons including Dr Cyndia Bent and Dr. Roxan Hockey and he is felt to be at high risk for conventional surgical aortic valve replacement. Both surgeons indicated the patient would be a poor candidate for conventional surgery. Based upon review of all of the patient's preoperative diagnostic tests they are felt to be candidate for transcatheter aortic valve replacement using the transfemoral approach as an alternative to high risk conventional surgery.    Following the decision to proceed with transcatheter aortic valve replacement, a discussion has been held regarding what types of management strategies would be attempted intraoperatively in the event of life-threatening complications, including whether or not the patient would be considered a candidate for the use of cardiopulmonary bypass and/or conversion to open sternotomy for attempted surgical intervention.  The patient has been advised of a variety of complications that might develop peculiar to this approach including but not limited to risks of death, stroke, paravalvular leak, aortic dissection or other major vascular complications, aortic annulus rupture, device embolization, cardiac rupture or perforation, acute myocardial infarction, arrhythmia, heart block or bradycardia requiring permanent pacemaker placement, congestive heart failure, respiratory failure, renal failure, pneumonia, infection, other late complications related to structural valve deterioration or migration, or other complications that might ultimately cause a temporary or permanent loss of functional independence or other long term morbidity.  The patient provides full informed consent for the procedure as described and all questions were answered preoperatively.    DETAILS OF THE OPERATIVE PROCEDURE  PREPARATION:   The patient is brought to the  operating room on the above mentioned date and central monitoring  was established by the anesthesia team including placement of Swan-Ganz catheter and radial arterial line. The patient is placed in the supine position on the operating table.  Intravenous antibiotics are administered. General endotracheal anesthesia is induced uneventfully. A Foley catheter is placed.  Baseline transesophageal echocardiogram was performed. The patient's chest, abdomen, both groins, and both lower extremities are prepared and draped in a sterile manner. A time out procedure is performed.   PERIPHERAL ACCESS:   Using the modified Seldinger technique, femoral arterial and venous access were obtained with placement of 6 Fr sheaths on the left side.  A pigtail diagnostic catheter was passed through the femoral arterial sheath under fluoroscopic guidance into the aortic root.  A temporary transvenous pacemaker catheter was passed through the femoral venous sheath under fluoroscopic guidance into the right ventricle.  The pacemaker was tested to ensure stable lead placement and pacemaker capture. Aortic root angiography was performed in order to determine the optimal angiographic angle for valve deployment.  TRANSFEMORAL ACCESS:  Arterial access obtained in the right femoral artery with the use of a micropuncture catheter. Pre-closure with double ProGlide closure devices. The patient was heparinized systemically and ACT verified > 250 seconds.    A 16 Fr transfemoral E-sheath was introduced into the right femoral artery after progressively dilating over an Amplatz superstiff wire. An AL-2 catheter was used to direct a straight-tip exchange length wire across the native aortic valve into the left ventricle. This was exchanged out for a pigtail catheter and position was confirmed in the LV apex. Simultaneous LV and Ao pressures were recorded.  The pigtail catheter was then exchanged for an Amplatz Extra-stiff wire in the LV apex.  At that point, BAV was performed using a 25 mm valvuloplasty balloon.  Once optimal position was achieved, BAV was done under rapid ventricular pacing at 180 bpm. The patient recovered well hemodynamically.   TRANSCATHETER HEART VALVE DEPLOYMENT:  An Edwards Sapien 3 THV (size 29 mm) was prepared and crimped per manufacturer's guidelines, and the proper orientation of the valve is confirmed on the [employer] delivery system. The valve was advanced through the introducer sheath using normal technique until in an appropriate position in the abdominal aorta beyond the sheath tip. The balloon was then retracted and using the fine-tuning wheel was centered on the valve. The valve was then advanced across the aortic arch using appropriate flexion of the catheter. The valve was carefully positioned across the aortic valve annulus. The Commander catheter was retracted using normal technique. Once final position of the valve has been confirmed by angiographic assessment, the valve is deployed while temporarily holding ventilation and during rapid ventricular pacing to maintain systolic blood pressure < 50 mmHg and pulse pressure < 10 mmHg. The balloon inflation is held for >3 seconds after reaching full deployment volume. Once the balloon has fully deflated the balloon is retracted into the ascending aorta and valve function is assessed using TEE. There is felt to be trivial paravalvular leak and no central aortic insufficiency.  The patient's hemodynamic recovery following valve deployment is good.  The deployment balloon and guidewire are both removed. Echo demostrated acceptable post-procedural gradients, stable mitral valve function, and trivial AI.   PROCEDURE COMPLETION:  The sheath was then removed and arteriotomy was closed with completion of the ProGlide closure devices. Protamine was administered once femoral arterial closure was complete. The temporary pacemaker was left in place due to paroxysm of a  junctional rhythm. The pigtail catheter and left femoral arterial  sheath was removed with manual pressure used for hemostasis.   The patient tolerated the procedure well and is transported to the surgical intensive care in stable condition. There were no immediate intraoperative complications. All sponge instrument and needle counts are verified correct at completion of the operation.   No blood products were administered during the operation.  The patient received a total of 75 mL of intravenous contrast during the procedure.  Lauree Chandler MD 12/08/2016 1:17 PM

## 2016-12-08 NOTE — Anesthesia Procedure Notes (Signed)
Arterial Line Insertion Start/End4/05/2017 10:06 AM, 12/08/2016 10:06 AM Performed by: Lelon Perla A, CRNA  Preanesthetic checklist: patient identified Right, radial was placed Catheter size: 20 Fr Hand hygiene performed  and maximum sterile barriers used   Attempts: 1 Procedure performed without using ultrasound guided technique. Following insertion, Biopatch and dressing applied. Patient tolerated the procedure well with no immediate complications.

## 2016-12-09 ENCOUNTER — Encounter (HOSPITAL_COMMUNITY): Payer: Self-pay | Admitting: Cardiovascular Disease

## 2016-12-09 ENCOUNTER — Inpatient Hospital Stay (HOSPITAL_COMMUNITY): Payer: Medicare Other

## 2016-12-09 DIAGNOSIS — I350 Nonrheumatic aortic (valve) stenosis: Principal | ICD-10-CM

## 2016-12-09 LAB — BASIC METABOLIC PANEL
Anion gap: 9 (ref 5–15)
BUN: 13 mg/dL (ref 6–20)
CO2: 23 mmol/L (ref 22–32)
Calcium: 8.5 mg/dL — ABNORMAL LOW (ref 8.9–10.3)
Chloride: 105 mmol/L (ref 101–111)
Creatinine, Ser: 0.95 mg/dL (ref 0.61–1.24)
GFR calc Af Amer: >60 mL/min (ref >60)
GFR calc non Af Amer: >60 mL/min (ref >60)
GLUCOSE: 93 mg/dL (ref 65–99)
Potassium: 3.9 mmol/L (ref 3.5–5.1)
SODIUM: 137 mmol/L (ref 135–145)

## 2016-12-09 LAB — CBC
HCT: 33.4 % — ABNORMAL LOW (ref 39.0–52.0)
Hemoglobin: 10.8 g/dL — ABNORMAL LOW (ref 13.0–17.0)
MCH: 28.7 pg (ref 26.0–34.0)
MCHC: 32.3 g/dL (ref 30.0–36.0)
MCV: 88.8 fL (ref 78.0–100.0)
PLATELETS: 151 10*3/uL (ref 150–400)
RBC: 3.76 MIL/uL — ABNORMAL LOW (ref 4.22–5.81)
RDW: 14.1 % (ref 11.5–15.5)
WBC: 7.2 10*3/uL (ref 4.0–10.5)

## 2016-12-09 LAB — MAGNESIUM: MAGNESIUM: 1.9 mg/dL (ref 1.7–2.4)

## 2016-12-09 MED ORDER — ASPIRIN 81 MG PO CHEW
81.0000 mg | CHEWABLE_TABLET | Freq: Every day | ORAL | Status: DC
  Administered 2016-12-09 – 2016-12-10 (×2): 81 mg via ORAL
  Filled 2016-12-09 (×2): qty 1

## 2016-12-09 NOTE — Progress Notes (Signed)
Pt arrived to unit from [facility].  Telemetry applied and CCMD notified x 2.  Pt, wife and son oriented to room including call light and telephone.  Plan of care discussed and family in agreement.  Pt denies pain at this time.  No s/s of distress.

## 2016-12-09 NOTE — Care Management Note (Signed)
Case Management Note  Patient Details  Name: Jonathan Graham MRN: [id number] Date of Birth: 1928-05-15  Subjective/Objective:  POD 1 TAVR , for echo today, dc foley, aline and central line.                    Action/Plan:  NCM will cont to follow for dc needs.  Expected Discharge Date:                  Expected Discharge Plan:  Home/Self Care  In-House Referral:     Discharge planning Services  CM Consult  Post Acute Care Choice:    Choice offered to:     DME Arranged:    DME Agency:     HH Arranged:    HH Agency:     Status of Service:  In process, will continue to follow  If discussed at Long Length of Stay Meetings, dates discussed:    Additional Comments:  Zenon Mayo, RN 12/09/2016, 3:05 PM

## 2016-12-09 NOTE — Progress Notes (Signed)
     SUBJECTIVE: No chest pain or dyspnea  Tele: sinus, PVCs  BP (!) 122/59   Pulse (!) 57   Temp 98.6 F (37 C) (Oral)   Resp 12   Ht 5\' 9"  (1.753 m)   Wt 161 lb 8 oz (73.3 kg)   SpO2 95%   BMI 23.85 kg/m   Intake/Output Summary (Last 24 hours) at 12/09/16 0801 Last data filed at 12/09/16 0700  Gross per 24 hour  Intake          2130.83 ml  Output             1600 ml  Net           530.83 ml    PHYSICAL EXAM General: Well developed, well nourished, in no acute distress. Alert and oriented x 3.  Psych:  Good affect, responds appropriately Neck: No JVD. No masses noted.  Lungs: Clear bilaterally with no wheezes or rhonci noted.  Heart: RRR with no murmurs noted. Abdomen: Bowel sounds are present. Soft, non-tender.  Extremities: No lower extremity edema. No groin hematoma  LABS: Basic Metabolic Panel:  Recent Labs  12/08/16 1220 12/08/16 1359 12/09/16 0249  NA 142 140 137  K 4.2 4.2 3.9  CL 104  --  105  CO2  --   --  23  GLUCOSE 138* 119* 93  BUN 17  --  13  CREATININE 0.90  --  0.95  CALCIUM  --   --  8.5*  MG  --   --  1.9   CBC:  Recent Labs  12/08/16 1354 12/08/16 1359 12/09/16 0249  WBC 7.1  --  7.2  HGB 11.3* 10.9* 10.8*  HCT 34.1* 32.0* 33.4*  MCV 88.8  --  88.8  PLT 161  --  151   Current Meds: . acetaminophen  1,000 mg Oral Q6H   Or  . acetaminophen (TYLENOL) oral liquid 160 mg/5 mL  1,000 mg Per Tube Q6H  . aspirin EC  325 mg Oral Daily   Or  . aspirin  324 mg Per Tube Daily  . cefUROXime (ZINACEF)  IV  1.5 g Intravenous Q12H  . clopidogrel  75 mg Oral Q breakfast  . mouth rinse  15 mL Mouth Rinse BID  . [START ON 12/10/2016] pantoprazole  40 mg Oral Daily  . simvastatin  5 mg Oral q1800  . tamsulosin  0.4 mg Oral QPC supper     ASSESSMENT AND PLAN:  1. Severe aortic valve stenosis: He is POD #1 s/p TAVR. He is doing well this am. Will d/c foley, arterial line and central line. Will continue ASA and Plavix. Echo today to  assess valve.   Transfer to telemetry unit.   Lauree Chandler  4/11/20188:01 AM

## 2016-12-09 NOTE — Discharge Summary (Signed)
Discharge Summary    Patient ID: Jonathan Graham,  MRN: [id number], DOB/AGE: 10/19/1927 83 y.o.  Admit date: 12/08/2016 Discharge date: 12/10/2016  Primary Care Provider: [name] Primary Cardiologist: [name]  Discharge Diagnoses    Active Problems:   Severe aortic stenosis   Allergies Allergies  Allergen Reactions  . Lipitor [Atorvastatin Calcium] Other (See Comments)    MYALGIAS CRAMPS    Diagnostic Studies/Procedures    TAVR: 12/08/16  Procedure:        Transcatheter Aortic Valve Replacement - Transfemoral Approach             Edwards Sapien 3 THV (size 29 mm, model # [id number], serial # [id number])              Co-Surgeons:  Gaye Pollack, MD and Lauree Chandler, MD  Anesthesiologist:                  Ermalene Postin  Echocardiographer:              Meda Coffee  Pre-operative Echo Findings: ? Severe aortic stenosis ? Normal left ventricular systolic function  Post-operative Echo Findings: ? Trivial paravalvular leak ? Normal left ventricular systolic function  Echo: 3/71/69  Study Conclusions  - Left ventricle: The cavity size was normal. Systolic function was   moderately to severely reduced. The estimated ejection fraction   was in the range of 30% to 35%. Diffuse hypokinesis. Doppler   parameters are consistent with abnormal left ventricular   relaxation (grade 1 diastolic dysfunction). - Aortic valve: A TAVR bioprosthesis was present. There was mild   perivalvular regurgitation septal region as well as trace mid   valvular regurgitation. Peak velocity (S): 228 cm/s. Mean   gradient (S): 11 mm Hg. - Mitral valve: Moderately calcified annulus. - Right atrium: The atrium was severely dilated. - Tricuspid valve: There was trivial regurgitation.  Impressions:  - TAVR valve is new. EF remains reduced. ____________   History of Present Illness     83 yo male with PMH of hypercholesterolemia, LBBB, hx or prostate cancer in 2014  treated with XRT and known moderate AS by echo. His echo in 10/2014 showed a mean gradient of 24 mm Hg and an LVEF of 40-45%. His mean gradient in 07/2012 was 15 mm Hg and the EF was the same. His initial echo in 2007 showed an EF of 55-60%. He says that he has been in his usual state of health and was scheduled for a routine follow up echo on 10/15/2016. This showed a mean AV gradient of 20 mm Hg with a peak of 32. The aortic valve is heavily calcified and trileaflet with restricted mobility. His EF was down to 25-30% with mild MR. A dobutamine stress echo showed significant contractile reserve with a doubling of cardiac output on peak dobutamine and a rise in the mean gradient from 23 to 48 mm Hg. He subsequently underwent a right and left heart cath showing mild nonobstructive CAD with normal right heart pressures and normal LV filling pressure with a mean AV gradient of 26 mm Hg. Dobutamine stress echo showed significant contractile reserve with W cardiac output and rising mean gradients of 48 mmHg suggesting improvement of LV function with aVR. It was felt that he was high risk for surgical aVR and TAVR was his best option.  Hospital Course     Consultants: CVTS  He presented on 12/08/16 and underwent successful TAVR with Edwards Sapien 3 THV (size 29 mm).  Patient tolerated the procedure well and was placed in the surgical ICU postprocedure. On postop day one reported feeling well, foley catheter, arterial line and central line were discontinued. He was placed on aspirin and Plavix post procedure. Had a mild drop in Hgb post op, but improved the day of discharge. Follow-up echo showed TAVR valve stable with mild perivalvular regurgitation, with mean gradient of 11 mmHg. Will plan for one month follow up echo.   He was seen by Dr. Angelena Form and determined stable for discharge home. Follow up was arranged in the office.  _____________  Discharge Vitals Blood pressure 101/77, pulse 79, temperature 97.9 F  (36.6 C), temperature source Oral, resp. rate 20, height 5\' 9"  (1.753 m), weight 159 lb 9.6 oz (72.4 kg), SpO2 100 %.  Filed Weights   12/08/16 0807 12/09/16 0500 12/10/16 0454  Weight: 160 lb (72.6 kg) 161 lb 8 oz (73.3 kg) 159 lb 9.6 oz (72.4 kg)    Labs & Radiologic Studies    CBC  Recent Labs  12/09/16 0249 12/10/16 0159  WBC 7.2 8.2  HGB 10.8* 12.1*  HCT 33.4* 36.5*  MCV 88.8 89.2  PLT 151 244   Basic Metabolic Panel  Recent Labs  12/09/16 0249 12/10/16 0159  NA 137 138  K 3.9 4.5  CL 105 106  CO2 23 25  GLUCOSE 93 94  BUN 13 14  CREATININE 0.95 0.95  CALCIUM 8.5* 8.7*  MG 1.9  --    Liver Function Tests No results for input(s): AST, ALT, ALKPHOS, BILITOT, PROT, ALBUMIN in the last 72 hours. No results for input(s): LIPASE, AMYLASE in the last 72 hours. Cardiac Enzymes No results for input(s): CKTOTAL, CKMB, CKMBINDEX, TROPONINI in the last 72 hours. BNP Invalid input(s): POCBNP D-Dimer No results for input(s): DDIMER in the last 72 hours. Hemoglobin A1C No results for input(s): HGBA1C in the last 72 hours. Fasting Lipid Panel No results for input(s): CHOL, HDL, LDLCALC, TRIG, CHOLHDL, LDLDIRECT in the last 72 hours. Thyroid Function Tests No results for input(s): TSH, T4TOTAL, T3FREE, THYROIDAB in the last 72 hours.  Invalid input(s): FREET3 _____________   Dg Chest Port 1 View  Result Date: 12/09/2016 CLINICAL DATA:  Status post transcatheter aortic valve replacement. EXAM: PORTABLE CHEST 1 VIEW COMPARISON:  Portable chest x-ray of December 08, 2016 FINDINGS: The lungs are well-expanded and clear. The heart and pulmonary vascularity are normal. The aortic valve cage appears to be in stable position. The right internal jugular venous catheter tip projects over the junction of the proximal and midportions of the SVC. There is calcification in the wall of the aortic arch. There is no pleural effusion. IMPRESSION: No evidence of CHF nor other acute  cardiopulmonary abnormality. Thoracic aortic atherosclerosis. Electronically Signed   By: David  [name] M.D.   On: 12/09/2016 07:46   Dg Chest Port 1 View  Result Date: 12/08/2016 CLINICAL DATA:  Status post TAVR. EXAM: PORTABLE CHEST 1 VIEW COMPARISON:  Chest radiograph 12/02/2016. FINDINGS: The heart size and mediastinal contours are within normal limits. Both lungs are clear. The visualized skeletal structures are unremarkable. The patient is status post TAVR. Central venous catheter tip SVC RA junction. No pneumothorax. IMPRESSION: Satisfactory appearance status post TAVR.  No adverse features. Electronically Signed   By: Staci Righter M.D.   On: 12/08/2016 15:20     Disposition   Pt is being discharged home today in good condition.  Follow-up Plans & Appointments    Follow-up Information  Almyra Deforest, [place] Follow up on 12/18/2016.   Specialties:  Cardiology, Radiology Why:  at 8am for your follow up appt  Contact information: [address] [place] [address] [phone]          Discharge Instructions    Amb Referral to Cardiac Rehabilitation    Complete by:  As directed    Diagnosis:  Valve Replacement   Valve:  Aortic Comment - TAVR   Call MD for:  redness, tenderness, or signs of infection (pain, swelling, redness, odor or green/yellow discharge around incision site)    Complete by:  As directed    Diet - low sodium heart healthy    Complete by:  As directed    Discharge instructions    Complete by:  As directed    ACTIVITY AND EXERCISE . Daily activity and exercise are an important part of your recovery. People recover at different rates depending on their general health and type of valve procedure. . Most people require six to 10 weeks to feel recovered. . No lifting, pushing, pulling more than 10 pounds (examples to avoid: groceries, vacuuming, gardening, golfing):  - For one week with a procedure through the groin.  - For six weeks for procedures  through the chest wall.  - For three months for procedures through the breast-bone. NOTE: You will typically see one of our providers 7-10 days after your procedure to discuss [place] the above activities.    DRIVING . Do not drive for four weeks after the date of your procedure. . If you have been told by your doctor in the past that you may not drive, you must talk with him/her before you begin driving again. . When you resume driving, you must have someone with you.   DRESSING . Groin site: you may leave the clear dressing over the site for up to one week or until it falls off.   HYGIENE . If you had a femoral (leg) procedure, you may take a shower when you return home. After the shower, pat the site dry. Do NOT use powder, oils or lotions in your groin area until the site has completely healed. . If you had a chest procedure, you may shower when you return home unless specifically instructed not to by your discharging practitioner.  - DO NOT scrub incision; pat dry with a towel  - DO NOT apply any lotions, oils, powders to the incision  - No tub baths / swimming for at least six weeks. . If you notice any fevers, chills, increased pain, swelling, bleeding or pus, please contact your doctor.  ADDITIONAL INFORMATION . If you are going to have an upcoming dental procedure, please contact our office as you may require antibiotics ahead of time to prevent infection on your heart valve.   Increase activity slowly    Complete by:  As directed       Discharge Medications   Current Discharge Medication List    START taking these medications   Details  clopidogrel (PLAVIX) 75 MG tablet Take 1 tablet (75 mg total) by mouth daily with breakfast. Qty: 30 tablet, Refills: 6      CONTINUE these medications which have NOT CHANGED   Details  aspirin EC 81 MG tablet Take 81 mg by mouth daily.    simvastatin (ZOCOR) 10 MG tablet Take 5 mg by mouth daily at 6 PM.    tamsulosin  (FLOMAX) 0.4 MG CAPS capsule Take 0.4 mg by  mouth daily. Daily during the evening         Outstanding Labs/Studies   1 month f/u with Dr. Angelena Form and echo  Duration of Discharge Encounter   Greater than 30 minutes including physician time.  Signed, Reino Bellis NP-C 12/10/2016, 4:30 PM

## 2016-12-10 ENCOUNTER — Inpatient Hospital Stay (HOSPITAL_COMMUNITY): Payer: Medicare Other

## 2016-12-10 ENCOUNTER — Other Ambulatory Visit: Payer: Self-pay | Admitting: *Deleted

## 2016-12-10 DIAGNOSIS — Z952 Presence of prosthetic heart valve: Secondary | ICD-10-CM

## 2016-12-10 DIAGNOSIS — I350 Nonrheumatic aortic (valve) stenosis: Secondary | ICD-10-CM

## 2016-12-10 LAB — ECHOCARDIOGRAM COMPLETE
AOPV: 0.5 m/s
AOVTI: 42.3 cm
AV Area VTI: 1.73 cm2
AV Area mean vel: 1.81 cm2
AV Mean grad: 10 mmHg
AV Peak grad: 19 mmHg
AV VEL mean LVOT/AV: 0.52
AV area mean vel ind: 0.96 cm2/m2
AVA: 1.69 cm2
AVAREAVTIIND: 0.9 cm2/m2
AVPHT: 324 ms
AVPKVEL: 216 cm/s
CHL CUP AV PEAK INDEX: 0.92
CHL CUP AV VALUE AREA INDEX: 0.9
CHL CUP AV VEL: 1.69
DOP CAL AO MEAN VELOCITY: 143 cm/s
EERAT: 7.9
EWDT: 143 ms
FS: 25 % — AB (ref 28–44)
Height: 69 in
IVS/LV PW RATIO, ED: 0.94
LA diam index: 1.91 cm/m2
LA vol index: 35.4 mL/m2
LASIZE: 36 mm
LAVOL: 66.6 mL
LAVOLA4C: 45.7 mL
LEFT ATRIUM END SYS DIAM: 36 mm
LV E/e'average: 7.9
LV SIMPSON'S DISK: 54
LV dias vol index: 115 mL/m2
LV e' LATERAL: 7.8 cm/s
LV sys vol: 100 mL — AB (ref 21–61)
LVDIAVOL: 217 mL — AB (ref 62–150)
LVEEMED: 7.9
LVOT SV: 71 mL
LVOT VTI: 20.6 cm
LVOT area: 3.46 cm2
LVOT diameter: 21 mm
LVOT peak VTI: 0.49 cm
LVOT peak vel: 108 cm/s
LVSYSVOLIN: 53 mL/m2
MV Dec: 143
MV pk E vel: 61.6 m/s
MVPKAVEL: 124 m/s
PW: 10.8 mm — AB (ref 0.6–1.1)
RV LATERAL S' VELOCITY: 13.9 cm/s
RV sys press: 22 mmHg
Reg peak vel: 217 cm/s
Stroke v: 117 ml
TAPSE: 26.7 mm
TDI e' lateral: 7.8
TDI e' medial: 3.92
TRMAXVEL: 217 cm/s
Weight: 2553.6 oz

## 2016-12-10 LAB — BASIC METABOLIC PANEL
ANION GAP: 7 (ref 5–15)
BUN: 14 mg/dL (ref 6–20)
CHLORIDE: 106 mmol/L (ref 101–111)
CO2: 25 mmol/L (ref 22–32)
Calcium: 8.7 mg/dL — ABNORMAL LOW (ref 8.9–10.3)
Creatinine, Ser: 0.95 mg/dL (ref 0.61–1.24)
Glucose, Bld: 94 mg/dL (ref 65–99)
POTASSIUM: 4.5 mmol/L (ref 3.5–5.1)
SODIUM: 138 mmol/L (ref 135–145)

## 2016-12-10 LAB — CBC
HCT: 36.5 % — ABNORMAL LOW (ref 39.0–52.0)
HEMOGLOBIN: 12.1 g/dL — AB (ref 13.0–17.0)
MCH: 29.6 pg (ref 26.0–34.0)
MCHC: 33.2 g/dL (ref 30.0–36.0)
MCV: 89.2 fL (ref 78.0–100.0)
PLATELETS: 165 10*3/uL (ref 150–400)
RBC: 4.09 MIL/uL — ABNORMAL LOW (ref 4.22–5.81)
RDW: 14.2 % (ref 11.5–15.5)
WBC: 8.2 10*3/uL (ref 4.0–10.5)

## 2016-12-10 MED ORDER — CLOPIDOGREL BISULFATE 75 MG PO TABS: 75.0000 mg | ORAL_TABLET | Freq: Every day | ORAL | 6 refills | Status: DC

## 2016-12-10 NOTE — Progress Notes (Signed)
     SUBJECTIVE: No chest pain, dyspnea, dizziness. He is feeling great.   Tele: sinus  BP 124/69 (BP Location: Left Arm)   Pulse 91   Temp 97.7 F (36.5 C) (Oral)   Resp 20   Ht 5\' 9"  (1.753 m)   Wt 159 lb 9.6 oz (72.4 kg)   SpO2 96%   BMI 23.57 kg/m   Intake/Output Summary (Last 24 hours) at 12/10/16 0648 Last data filed at 12/09/16 2252  Gross per 24 hour  Intake               80 ml  Output              645 ml  Net             -565 ml    PHYSICAL EXAM General: Well developed, well nourished, in no acute distress. Alert and oriented x 3.  Psych:  Good affect, responds appropriately Neck: No JVD. No masses noted.  Lungs: Clear bilaterally with no wheezes or rhonci noted.  Heart: RRR with no murmurs noted. Abdomen: Bowel sounds are present. Soft, non-tender.  Extremities: No lower extremity edema. No groin hematoma  LABS: Basic Metabolic Panel:  Recent Labs  12/09/16 0249 12/10/16 0159  NA 137 138  K 3.9 4.5  CL 105 106  CO2 23 25  GLUCOSE 93 94  BUN 13 14  CREATININE 0.95 0.95  CALCIUM 8.5* 8.7*  MG 1.9  --    CBC:  Recent Labs  12/09/16 0249 12/10/16 0159  WBC 7.2 8.2  HGB 10.8* 12.1*  HCT 33.4* 36.5*  MCV 88.8 89.2  PLT 151 165   Current Meds: . acetaminophen  1,000 mg Oral Q6H   Or  . acetaminophen (TYLENOL) oral liquid 160 mg/5 mL  1,000 mg Per Tube Q6H  . aspirin  81 mg Oral Daily  . cefUROXime (ZINACEF)  IV  1.5 g Intravenous Q12H  . clopidogrel  75 mg Oral Q breakfast  . pantoprazole  40 mg Oral Daily  . simvastatin  5 mg Oral q1800  . tamsulosin  0.4 mg Oral QPC supper     ASSESSMENT AND PLAN:  1. Severe aortic valve stenosis: He is POD #2 s/p TAVR. He is doing well this am. Echo is pending today. Discharge will be pending results of his echo. Will continue ASA and Plavix.   Discharge home today if echo is ok. He will need follow up in one week with office APP and follow up in one month with me with echo.   Lauree Chandler  4/12/20186:48 AM

## 2016-12-10 NOTE — Progress Notes (Signed)
CARDIAC REHAB PHASE I   PRE:  Rate/Rhythm: 67 SR  BP:  Supine: 122/60  Sitting:   Standing:    SaO2: 97%RA  MODE:  Ambulation: 550 ft   POST:  Rate/Rhythm: 88  BP:  Supine:   Sitting: 122/73  Standing:    SaO2: 97%RA 1040-1132 Pt walked 550 ft on RA with steady gait. Tolerated well. To sitting on side of bed after walk. Gave pt low sodium diet and encouraged to watch sodium with low EF. To get repeat ECHO today. Encouraged pt to notify MD if increased edema of feet or hands. Encouraged IS. Pt stated he has bone on bone in knee so encouraged walking as tolerated. Discussed CRP 2 and will refer to [place].   Graylon Good, RN BSN  12/10/2016 11:28 AM

## 2016-12-10 NOTE — Progress Notes (Signed)
      [address]       [place] [address]             [phone]      2 Days Post-Op Procedure(s) (LRB): TRANSCATHETER AORTIC VALVE REPLACEMENT, TRANSFEMORAL (N/A) TRANSESOPHAGEAL ECHOCARDIOGRAM (TEE) (N/A)   Subjective:  No complaints.  Feeling pretty good.  Objective: Vital signs in last 24 hours: Temp:  [97.6 F (36.4 C)-99.2 F (37.3 C)] 97.7 F (36.5 C) (04/12 0457) Pulse Rate:  [57-91] 91 (04/12 0457) Cardiac Rhythm: Normal sinus rhythm;Bundle branch block;Heart block (04/11 1900) Resp:  [11-20] 20 (04/12 0457) BP: (108-136)/(55-83) 124/69 (04/12 0457) SpO2:  [95 %-99 %] 96 % (04/12 0457) Arterial Line BP: (157)/(57) 157/57 (04/11 0800) Weight:  [159 lb 9.6 oz (72.4 kg)] 159 lb 9.6 oz (72.4 kg) (04/12 0454)  Intake/Output from previous day: 04/11 0701 - 04/12 0700 In: 70 [I.V.:20; IV Piggyback:50] Out: 645 [Urine:645]  General appearance: alert, cooperative and no distress Heart: regular rate and rhythm Lungs: clear to auscultation bilaterally Abdomen: soft, non-tender; bowel sounds normal; no masses,  no organomegaly Extremities: edema none appreciated Wound: clean, some minor bleeding from right groin incision, dressing in place  Lab Results:  Recent Labs  12/09/16 0249 12/10/16 0159  WBC 7.2 8.2  HGB 10.8* 12.1*  HCT 33.4* 36.5*  PLT 151 165   BMET:  Recent Labs  12/09/16 0249 12/10/16 0159  NA 137 138  K 3.9 4.5  CL 105 106  CO2 23 25  GLUCOSE 93 94  BUN 13 14  CREATININE 0.95 0.95  CALCIUM 8.5* 8.7*    PT/INR:  Recent Labs  12/08/16 1354  LABPROT 16.4*  INR 1.31   ABG    Component Value Date/Time   PHART 7.436 12/08/2016 1354   HCO3 21.8 12/08/2016 1354   TCO2 23 12/08/2016 1354   ACIDBASEDEF 2.0 12/08/2016 1354   O2SAT 100.0 12/08/2016 1354   CBG (last 3)  No results for input(s): GLUCAP in the last 72 hours.  Assessment/Plan: S/P Procedure(s) (LRB): TRANSCATHETER AORTIC VALVE REPLACEMENT,  TRANSFEMORAL (N/A) TRANSESOPHAGEAL ECHOCARDIOGRAM (TEE) (N/A)  1. CV- S/P TAVR, doing well, mild HTN- ECHO today if looks good, will be d/c later today  2. Pulm- no acute issues, continue IS 3. Dispo- patient looks good, care per Cardiology   LOS: 2 days    Ellwood Handler 12/10/2016

## 2016-12-10 NOTE — Progress Notes (Signed)
Pt/family given discharge instructions, medication lists, follow up appointments, and when to call the doctor.  Pt/family verbalizes understanding. Pt given signs and symptoms of infection. Patient given valve card.  Payton Emerald, RN

## 2016-12-10 NOTE — Progress Notes (Signed)
  Echocardiogram 2D Echocardiogram has been performed.  Jonathan Graham Jonathan Graham 12/10/2016, 3:59 PM

## 2016-12-10 NOTE — Consult Note (Signed)
[facility] CM Primary Care Navigator  12/10/2016  Jonathan Graham [birth year] [id number]    Met with patientand wife Jonathan Graham) at the bedside to identify possible discharge needs. Patientreports having some episodes of getting easily fatigued and out of breath which led to this admission/ surgery (TAVR). Patient's wifeendorses Jonathan Graham with  [place] [facility] [place] as his primary care provider.   [facility] [employer] [facility] [place] [facility] to obtain medications without any problem.   Patientis managing hisown medications at home per wife.  He was also driving prior to admission, however, daughter Jonathan Graham) or son Jonathan Graham) will providetransportation to his doctors'appointmentsafter discharge when needed since wife has visual deficit .  His wife isthe primary caregiver at [employer] stated.    Discharge plan is home according to patient.  Patientand wife voiced understanding to call primary care provider's officewhen he gets home,for a post discharge follow-up appointment within a week or sooner if needs arise.Patient letter (with PCP's contact number) wasprovided as a reminder.  Patient and wife deniedfurther health management needs or concerns at this time.  [facility] care management contact information provided for future needs that may arise.  For questions, please contact:  Dannielle Huh, BSN, RN- [facility] Primary Care Navigator  Telephone: [phone] [place]

## 2016-12-11 ENCOUNTER — Telehealth (HOSPITAL_COMMUNITY): Payer: Self-pay | Admitting: Family Medicine

## 2016-12-11 MED FILL — Insulin Regular (Human) Inj 100 Unit/ML: INTRAMUSCULAR | Qty: 250 | Status: AC

## 2016-12-11 NOTE — Telephone Encounter (Signed)
Verified U[employer] Medicare A/B insurance benefits through [employer]. No Coinsurance or Deductible. Copay $20.00. Out of Pocket $4400.00, pt has met $633.14, pt's responsibility [phone]. Reference [id number].... KJ

## 2016-12-14 ENCOUNTER — Encounter: Payer: Self-pay | Admitting: Physician Assistant

## 2016-12-18 ENCOUNTER — Encounter: Payer: Self-pay | Admitting: Physician Assistant

## 2016-12-18 ENCOUNTER — Ambulatory Visit (INDEPENDENT_AMBULATORY_CARE_PROVIDER_SITE_OTHER): Payer: Medicare Other | Admitting: Physician Assistant

## 2016-12-18 VITALS — BP 110/60 | HR 74 | Ht 69.0 in | Wt 162.6 lb

## 2016-12-18 DIAGNOSIS — I2510 Atherosclerotic heart disease of native coronary artery without angina pectoris: Secondary | ICD-10-CM

## 2016-12-18 DIAGNOSIS — I447 Left bundle-branch block, unspecified: Secondary | ICD-10-CM

## 2016-12-18 DIAGNOSIS — Z952 Presence of prosthetic heart valve: Secondary | ICD-10-CM

## 2016-12-18 DIAGNOSIS — E785 Hyperlipidemia, unspecified: Secondary | ICD-10-CM | POA: Diagnosis not present

## 2016-12-18 NOTE — Patient Instructions (Addendum)
Medication Instructions:  Your physician recommends that you continue on your current medications as directed. Please refer to the Current Medication list given to you today.  If you need a refill on your cardiac medications before your next appointment, please call your pharmacy.  Testing/Procedures: ECHO SCHEDULED 01-08-2017 @ 3PM  Follow-Up: Your physician wants you to follow-up in: 4-5 MONTHS WITH DR [name]. You will receive a reminder letter in the mail two months in advance. If you don't receive a letter, please call our office July 2018 to schedule the September 2018 follow-up appointment.  KEEP [place] APPT 01-08-2017 @ 4PM.   Special Instructions: KEEP BP LOG-TAKE BP TWICE DAILY AND BRING TO APPTS   Thank you for choosing [facility]!!

## 2016-12-18 NOTE — Progress Notes (Signed)
Cardiology Office Note    Date:  12/19/2016   ID:  Jonathan Graham, DOB Mar 25, 1928, MRN [id number]  PCP:  [name], MD  Cardiologist:  Dr. [name] TVAR specialist: Dr. Angelena Form  Chief Complaint  Patient presents with  . Follow-up    tavr f/u. Seen for Dr. [name] and Dr. Angelena Form    History of Present Illness:  Jonathan Graham is a 83 y.o. male with PMH of HLD, LBBB, prostate CA, cardiomyopathy, CAD and severe AS. He had radiation therapy for prostate cancer. Echocardiogram obtained in February 2018 showed ejection fraction has decreased to 25-30% from the previous level of 40-45% of the last echocardiogram in March 2016, and also showed at least moderate aortic stenosis. Cardiac catheterization performed on 11/13/2016 showed 50% OM1 lesion, otherwise mild nonobstructive CAD, at least moderate aortic stenosis, EF 25-30%. Patient was referred to Dr. Angelena Form for consideration of TVAR. He did eventually undergo TAPVR on 12/08/2016 with Edwards Sapien 3 THV (size 52mm). Postprocedure, he recovered well. Echocardiogram obtained on 12/10/2016 showed EF 30-35%, TAVR bioprosthetic aortic valve present and there was mild perivalvular regurgitation, severely dilated right atrium.   Patient presents to cardiology office visit today. On physical exam, his heart is regular, low suspicion for atrial arrhythmia. I did not appreciate significant heart murmur either despite the fact recent post TAVR echocardiogram showed mild perivalvular regurgitation. Otherwise he has been doing quite well and ambulating. I think he can start driving by this point given how well he is recovering. He does not have any lower extremity edema, orthopnea, paroxysmal nocturnal dyspnea to suggest any heart failure. Right now he is on aspirin, Plavix, low-dose Zocor. He has a history of myalgia with high-dose Lipitor. I did recommend him to keep a blood pressure diary, his ejection fraction is still low on the last  echocardiogram. We will monitor how his ejection fraction improves on the next echocardiogram done during her visit with Dr. Angelena Form. If EF is still low around 35% that time, we may need to consider addition of carvedilol and lisinopril/losartan. I am hesitant to add those medications at this time as family mentioned sometimes his blood pressure become very low at home. If his blood pressure is stable on the blood pressure diary he bring to the next visit, starting a beta blocker or ACEI/ARB may be reasonable.   Past Medical History:  Diagnosis Date  . Arthritis   . Atypical chest pain    history of   . Back pain   . Cancer [facility]) 2014   prostate  . Cardiovascular stress test abnormal 04/25/2004   Abnormal adenosine Cardiolite study / evidence of inferoseptal ischemia &  an EF of 48% ""recommended that he undergo cardiac catheterization""  . Cervical spine disease   . Dizziness    occasionally apon bending over  . Fatigue    history of   . Heart murmur   . History of hiatal hernia   . History of mononucleosis   . Hypercholesterolemia   . Left bundle branch block   . Pleurisy 07/2005  . S/P radiation therapy  12/05/2012 through 01/30/2013                                                         Prostate 7800 cGy 40 sessions, seminal vesicles 5600 cGy  40 sessions                       . Shortness of breath   . Spondylosis, cervical    at C3-C4  . Varicose veins   . Weakness    history of     Past Surgical History:  Procedure Laterality Date  . BACK SURGERY  1995    2 ruptured discs in lower back / by Dr. Louanne Skye  . CARDIAC CATHETERIZATION  2005   EF estimated at 50% /  PROCEDURE:  Left heart catheterization, coronary and left ventricular angiography /  RAO view demonstrates mild LV enlargement  / mild hypokinesia anteroseptal with  overall low normal LV systolic function / Normal coronary anatomy  . HEMORRHOID SURGERY    . PROSTATE BIOPSY  08/25/2012   Gleason 3+3=6 PSA 7.80  .  RIGHT/LEFT HEART CATH AND CORONARY ANGIOGRAPHY N/A 11/13/2016   Procedure: Right/Left Heart Cath and Coronary Angiography;  Surgeon: [name] [name], MD;  Location: [place] [facility];  Service: Cardiovascular;  Laterality: N/A;  . TEE WITHOUT CARDIOVERSION N/A 12/08/2016   Procedure: TRANSESOPHAGEAL ECHOCARDIOGRAM (TEE);  Surgeon: Burnell Blanks, MD;  Location: [place];  Service: Open Heart Surgery;  Laterality: N/A;  . TONSILLECTOMY AND ADENOIDECTOMY    . TRANSCATHETER AORTIC VALVE REPLACEMENT, TRANSFEMORAL N/A 12/08/2016   Procedure: TRANSCATHETER AORTIC VALVE REPLACEMENT, TRANSFEMORAL;  Surgeon: Burnell Blanks, MD;  Location: [place];  Service: Open Heart Surgery;  Laterality: N/A;    Current Medications: Outpatient Medications Prior to Visit  Medication Sig Dispense Refill  . aspirin EC 81 MG tablet Take 81 mg by mouth daily.    . clopidogrel (PLAVIX) 75 MG tablet Take 1 tablet (75 mg total) by mouth daily with breakfast. 30 tablet 6  . simvastatin (ZOCOR) 10 MG tablet Take 5 mg by mouth daily at 6 PM.    . tamsulosin (FLOMAX) 0.4 MG CAPS capsule Take 0.4 mg by mouth daily. Daily during the evening     No facility-administered medications prior to visit.      Allergies:   Lipitor [atorvastatin calcium]   Social History   Social History  . Marital status: Married    Spouse name: N/A  . Number of children: 2  . 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 Smoking status: Former Smoker    Packs/day: 1.00    Years: 7.00    Types: Cigarettes    Quit date: 09/01/1951  . Smokeless tobacco: Never Used  . Alcohol use No  . Drug use: No  . Sexual activity: Not Asked   Other Topics Concern  . None   Social History Narrative  . None     Family History:  The patient's family history includes Cancer in his father; Heart disease in his mother; Heart failure in his mother; Lung cancer in his brother;  Multiple sclerosis in his sister; Stroke in his father.   ROS:   Please see the history of present illness.    ROS All other systems reviewed and are negative.   PHYSICAL EXAM:   VS:  BP 110/60   Pulse 74   Ht 5\' 9"  (1.753 m)   Wt 162 lb 9.6 oz (73.8 kg)   BMI 24.01 kg/m    GEN: Well nourished, well developed, in no acute distress  HEENT: normal  Neck: no JVD,  carotid bruits, or masses Cardiac: RRR; no murmurs, rubs, or gallops,no edema  Respiratory:  clear to auscultation bilaterally, normal work of breathing GI: soft, nontender, nondistended, + BS MS: no deformity or atrophy  Skin: warm and dry, no rash Neuro:  Alert and Oriented x 3, Strength and sensation are intact Psych: euthymic mood, full affect  Wt Readings from Last 3 Encounters:  12/18/16 162 lb 9.6 oz (73.8 kg)  12/10/16 159 lb 9.6 oz (72.4 kg)  12/02/16 160 lb (72.6 kg)      Studies/Labs Reviewed:   EKG:  EKG is not ordered today.  He appears to be in sinus rhythm based on physical exam  Recent Labs: 12/02/2016: ALT 12 12/09/2016: Magnesium 1.9 12/10/2016: BUN 14; Creatinine, Ser 0.95; Hemoglobin 12.1; Platelets 165; Potassium 4.5; Sodium 138   Lipid Panel    Component Value Date/Time   CHOL 154 09/24/2016 0803   TRIG 55 09/24/2016 0803   HDL 58 09/24/2016 0803   CHOLHDL 2.7 09/24/2016 0803   VLDL 11 09/24/2016 0803   LDLCALC 85 09/24/2016 0803    Additional studies/ records that were reviewed today include:   Echo 10/15/2016 LV EF: 25% -   30%  - Left ventricle: The cavity size was normal. Wall thickness was   increased in a pattern of mild LVH. Systolic function was   severely reduced. The estimated ejection fraction was in the   range of 25% to 30%. Doppler parameters are consistent with   abnormal left ventricular relaxation (grade 1 diastolic   dysfunction). - Aortic valve: Moderately to severely calcified annulus.   Moderately thickened, moderately calcified leaflets. Valve area   (VTI):  1.27 cm^2. Valve area (Vmax): 1.3 cm^2. Valve area   (Vmean): 1.26 cm^2. - Mitral valve: There was mild regurgitation. - Right atrium: The atrium was moderately dilated.  Impressions:  - The arotic valve is heavily calcified with restricted leaflet   mobility.   The peak gradient is 32 with a mean gradient of 20. The LV   function is severely reduced .   Consider low gradient / low cardiac output severe aortic   stenosis.   Cath 11/13/2016 Conclusion     Ost 1st Mrg to 1st Mrg lesion, 50 %stenosed.  There is moderate to severe left ventricular systolic dysfunction.  LV end diastolic pressure is normal.  The left ventricular ejection fraction is 25-35% by visual estimate.  LV end diastolic pressure is normal.  There is moderate aortic valve stenosis.   1. Mild nonobstructive CAD 2. Normal right heart pressures 3. Normal LV filling pressures  4. Moderate Aortic stenosis with mean gradient of 26 mm Hg. AV area may be underestimated due to low EF.   Plan: consider AVR/TAVR.       TEE 12/08/2016 LV EF: 30% -   35%  - Left ventricle: There was mild concentric hypertrophy. Systolic   function was moderately to severely reduced. The estimated   ejection fraction was in the range of 30% to 35%. Wall motion was   normal; there were no regional wall motion abnormalities. - Aortic valve: Cusp separation was severely reduced. There was   severe stenosis. There was trivial regurgitation. - Mitral valve: There was mild regurgitation. - Left atrium: The atrium was dilated. No evidence of thrombus in   the atrial cavity or appendage. No evidence of thrombus in the   atrial cavity or appendage. No evidence of thrombus in the atrial   cavity or appendage. - Right atrium:  No evidence of thrombus in the atrial cavity or   appendage.  Impressions:  - This was a perioperative TEE during a TAVR procedure.     LVEF was severely decreased estimated at 30%.   The native  aortic valve was severely thickened and calcified with   severely restricted leaflet openings. There was asymmetric   calcification in the non-coronary cusp.   The peak/mean transaortic gradients were 45/26 mmHg. This was a   low LVEF/low flow severe aortic stenosis as LVEF severely   impaired estimated at 30%.   A 29 mm Edwards-SAPIEN 3 valve was successfully deployed in the   aortic position.   Post deployment peak/mean gradients were 16/7 mmHg. There was no   central aortic regurgitation and only trivial paravalvular leak.   Mitral regurgitation was mild prior and post deployment.   Tricuspid regurgitation was mild prior and post deployment.   No pericardial effusion.   TAVR 12/08/2016 Pre-operative Echo Findings: ? Severe aortic stenosis ? Normal left ventricular systolic function  Post-operative Echo Findings: ? Trivial paravalvular leak ? Normal left ventricular systolic function  Transcatheter Aortic Valve Replacement - Transfemoral Approach             Edwards Sapien 3 THV (size 29 mm, model # [id number], serial # [id number])   Echo 12/10/2016 LV EF: 30% -   35%  - Left ventricle: The cavity size was normal. Systolic function was   moderately to severely reduced. The estimated ejection fraction   was in the range of 30% to 35%. Diffuse hypokinesis. Doppler   parameters are consistent with abnormal left ventricular   relaxation (grade 1 diastolic dysfunction). - Aortic valve: A TAVR bioprosthesis was present. There was mild   perivalvular regurgitation septal region as well as trace mid   valvular regurgitation. Peak velocity (S): 228 cm/s. Mean   gradient (S): 11 mm Hg. - Mitral valve: Moderately calcified annulus. - Right atrium: The atrium was severely dilated. - Tricuspid valve: There was trivial regurgitation.  Impressions:  - TAVR valve is new. EF remains reduced.  ASSESSMENT:    1. S/P TAVR (transcatheter aortic valve replacement)   2. Hyperlipidemia,  unspecified hyperlipidemia type   3. LBBB (left bundle branch block)   4. Coronary artery disease involving native coronary artery of native heart without angina pectoris      PLAN:  In order of problems listed above:  1. Severe AS s/p TAVR: Recovering well after the procedure, has always had some shortness of breath with exertion. No sign of heart failure on physical exam. Despite the fact that post however echocardiogram indicated mild perivalvular regurgitation, I did not hear any obvious murmur on physical exam. I have informed the patient that he will require a repeat echocardiogram on the day he follow-up with Dr. Angelena Form in order to assess the position of the valve.  2. NICM: EF 25-30% on echo in February, most recent echocardiogram in April showed EF 30-35%.   3. CAD: Only had 50% OM1 lesion on cardiac catheterization in March. Continue aspirin.  4. Hyperlipidemia: Continue Zocor 10 mg.    Medication Adjustments/Labs and Tests Ordered: Current medicines are reviewed at length with the patient today.  Concerns regarding medicines are outlined above.  Medication changes, Labs and Tests ordered today are listed in the Patient Instructions below. Patient Instructions  Medication Instructions:  Your physician recommends that you continue on your current medications as directed. Please refer to the Current Medication list given to you today.  If you need a refill on your cardiac medications before your next appointment, please call your pharmacy.  Testing/Procedures: ECHO SCHEDULED 01-08-2017 @ 3PM  Follow-Up: Your physician wants you to follow-up in: 4-5 MONTHS WITH DR [name]. You will receive a reminder letter in the mail two months in advance. If you don't receive a letter, please call our office July 2018 to schedule the September 2018 follow-up appointment.  KEEP [facility] APPT 01-08-2017 @ 4PM.   Special Instructions: KEEP BP LOG-TAKE BP TWICE DAILY AND BRING TO  APPTS   Thank you for choosing [facility] at [employer], [place]  12/19/2016 12:00 AM    [facility] [address], [place], [place]  [address] Phone: [phone]; Fax: [phone]

## 2016-12-23 ENCOUNTER — Telehealth (HOSPITAL_COMMUNITY): Payer: Self-pay | Admitting: Family Medicine

## 2016-12-30 DIAGNOSIS — C61 Malignant neoplasm of prostate: Secondary | ICD-10-CM | POA: Diagnosis not present

## 2016-12-30 LAB — PSA: PSA: 0.29

## 2017-01-04 DIAGNOSIS — N401 Enlarged prostate with lower urinary tract symptoms: Secondary | ICD-10-CM | POA: Diagnosis not present

## 2017-01-07 ENCOUNTER — Encounter: Payer: Self-pay | Admitting: Family Medicine

## 2017-01-08 ENCOUNTER — Ambulatory Visit (INDEPENDENT_AMBULATORY_CARE_PROVIDER_SITE_OTHER): Payer: Medicare Other | Admitting: Cardiovascular Disease

## 2017-01-08 ENCOUNTER — Encounter: Payer: Self-pay | Admitting: Cardiovascular Disease

## 2017-01-08 ENCOUNTER — Other Ambulatory Visit: Payer: Self-pay

## 2017-01-08 ENCOUNTER — Ambulatory Visit (HOSPITAL_COMMUNITY): Payer: Medicare Other | Attending: Cardiology

## 2017-01-08 VITALS — BP 122/62 | HR 68 | Ht 69.0 in | Wt 163.0 lb

## 2017-01-08 DIAGNOSIS — D649 Anemia, unspecified: Secondary | ICD-10-CM | POA: Diagnosis not present

## 2017-01-08 DIAGNOSIS — C61 Malignant neoplasm of prostate: Secondary | ICD-10-CM | POA: Diagnosis not present

## 2017-01-08 DIAGNOSIS — I447 Left bundle-branch block, unspecified: Secondary | ICD-10-CM | POA: Insufficient documentation

## 2017-01-08 DIAGNOSIS — Z87891 Personal history of nicotine dependence: Secondary | ICD-10-CM | POA: Diagnosis not present

## 2017-01-08 DIAGNOSIS — I350 Nonrheumatic aortic (valve) stenosis: Secondary | ICD-10-CM | POA: Diagnosis not present

## 2017-01-08 DIAGNOSIS — E785 Hyperlipidemia, unspecified: Secondary | ICD-10-CM | POA: Diagnosis not present

## 2017-01-08 DIAGNOSIS — Z952 Presence of prosthetic heart valve: Secondary | ICD-10-CM | POA: Diagnosis not present

## 2017-01-08 DIAGNOSIS — I509 Heart failure, unspecified: Secondary | ICD-10-CM | POA: Insufficient documentation

## 2017-01-08 DIAGNOSIS — I348 Other nonrheumatic mitral valve disorders: Secondary | ICD-10-CM | POA: Insufficient documentation

## 2017-01-08 DIAGNOSIS — Z953 Presence of xenogenic heart valve: Secondary | ICD-10-CM | POA: Diagnosis not present

## 2017-01-08 LAB — ECHOCARDIOGRAM COMPLETE
AOVTI: 51.3 cm
AVCELMEANRAT: 0.41
AVG: 12 mmHg
AVPG: 26 mmHg
AVPKVEL: 254 cm/s
Ao pk vel: 0.36 m/s
Area-P 1/2: 10 cm2
CHL CUP DOP CALC LVOT VTI: 18.2 cm
DOP CAL AO MEAN VELOCITY: 162 cm/s
E decel time: 74 msec
E/e' ratio: 5.78
FS: 18 % — AB (ref 28–44)
IVS/LV PW RATIO, ED: 1
LA diam end sys: 43 mm
LA diam index: 2.28 cm/m2
LA vol A4C: 74.7 ml
LA vol index: 42.6 mL/m2
LA vol: 80.5 mL
LASIZE: 43 mm
LV E/e'average: 5.78
LV TDI E'MEDIAL: 5.33
LV e' LATERAL: 8.05 cm/s
LVEEMED: 5.78
LVOTPV: 92.3 cm/s
LVOTVTI: 0.35 cm
MV Dec: 74
MVPKAVEL: 110 m/s
MVPKEVEL: 46.5 m/s
P 1/2 time: 22 ms
PW: 9 mm — AB (ref 0.6–1.1)
RV LATERAL S' VELOCITY: 12.9 cm/s
RV TAPSE: 28.7 mm
RV sys press: 50 mmHg
Reg peak vel: 324 cm/s
TDI e' lateral: 8.05
TRMAXVEL: 324 cm/s

## 2017-01-08 NOTE — Progress Notes (Signed)
TAVR Clinic Consult Note  Chief Complaint  Patient presents with  . Follow-up    aortic stenosis   History of Present Illness: 83 yo male with history of HLD, LBBB, prostate cancer, cardiomyopathy, CAD and severe aortic stenosis who is here today for one month TAVR follow up. He is followed in our office by Dr. [name]. He underwent TAVR on 12/08/16 with placement of 29 mm Edwards Sapien 3 bioprosthetic AVR. LVEF was improved slightly at 30-35% post valve replacement. His post-operative course was uncomplicated and he was discharged home 2 days post procedure.    He is here today for follow up. The patient denies any chest pain, dyspnea, palpitations, lower extremity edema, orthopnea, PND, dizziness, near syncope or syncope.    Primary Care Physician: [name], MD  Primary Cardiology: [name]  Past Medical History:  Diagnosis Date  . Arthritis   . Atypical chest pain    history of   . Back pain   . Cancer [facility]) 2014   prostate  . Cardiovascular stress test abnormal 04/25/2004   Abnormal adenosine Cardiolite study / evidence of inferoseptal ischemia &  an EF of 48% ""recommended that he undergo cardiac catheterization""  . Cervical spine disease   . Dizziness    occasionally apon bending over  . Fatigue    history of   . Heart murmur   . History of hiatal hernia   . History of mononucleosis   . Hypercholesterolemia   . Left bundle branch block   . Pleurisy 07/2005  . S/P radiation therapy  12/05/2012 through 01/30/2013                                                         Prostate 7800 cGy 40 sessions, seminal vesicles 5600 cGy 40 sessions                       . Shortness of breath   . Spondylosis, cervical    at C3-C4  . Varicose veins   . Weakness    history of     Past Surgical History:  Procedure Laterality Date  . BACK SURGERY  1995    2 ruptured discs in lower back / by Dr. Louanne Skye  . CARDIAC CATHETERIZATION  2005   EF estimated at 50% /  PROCEDURE:   Left heart catheterization, coronary and left ventricular angiography /  RAO view demonstrates mild LV enlargement  / mild hypokinesia anteroseptal with  overall low normal LV systolic function / Normal coronary anatomy  . HEMORRHOID SURGERY    . PROSTATE BIOPSY  08/25/2012   Gleason 3+3=6 PSA 7.80  . RIGHT/LEFT HEART CATH AND CORONARY ANGIOGRAPHY N/A 11/13/2016   Procedure: Right/Left Heart Cath and Coronary Angiography;  Surgeon: [name] [name], MD;  Location: [place] [facility];  Service: Cardiovascular;  Laterality: N/A;  . TEE WITHOUT CARDIOVERSION N/A 12/08/2016   Procedure: TRANSESOPHAGEAL ECHOCARDIOGRAM (TEE);  Surgeon: Burnell Blanks, MD;  Location: [place];  Service: Open Heart Surgery;  Laterality: N/A;  . TONSILLECTOMY AND ADENOIDECTOMY    . TRANSCATHETER AORTIC VALVE REPLACEMENT, TRANSFEMORAL N/A 12/08/2016   Procedure: TRANSCATHETER AORTIC VALVE REPLACEMENT, TRANSFEMORAL;  Surgeon: Burnell Blanks, MD;  Location: [place];  Service: Open Heart Surgery;  Laterality: N/A;  Current Outpatient Prescriptions  Medication Sig Dispense Refill  . aspirin EC 81 MG tablet Take 81 mg by mouth daily.    . clopidogrel (PLAVIX) 75 MG tablet Take 1 tablet (75 mg total) by mouth daily with breakfast. 30 tablet 6  . simvastatin (ZOCOR) 10 MG tablet Take 5 mg by mouth daily at 6 PM.    . tamsulosin (FLOMAX) 0.4 MG CAPS capsule Take 0.4 mg by mouth daily. Daily during the evening     No current facility-administered medications for this visit.     Allergies  Allergen Reactions  . Lipitor [Atorvastatin Calcium] Other (See Comments)    MYALGIAS CRAMPS    Social History   Social History  . Marital status: Married    Spouse name: N/A  . Number of children: 2  . 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 Smoking status: Former Smoker    Packs/day: 1.00    Years: 7.00    Types: Cigarettes    Quit  date: 09/01/1951  . Smokeless tobacco: Never Used  . Alcohol use No  . Drug use: No  . Sexual activity: Not on file   Other Topics Concern  . Not on file   Social History Narrative  . No narrative on file    Family History  Problem Relation Age of Onset  . Stroke Father   . Cancer Father   . Heart failure Mother   . Heart disease Mother   . Multiple sclerosis Sister   . Lung cancer Brother     Review of Systems:  As stated in the HPI and otherwise negative.   BP 122/62   Pulse 68   Ht 5\' 9"  (1.753 m)   Wt 163 lb (73.9 kg)   BMI 24.07 kg/m   Physical Examination: General: Well developed, well nourished, NAD  HEENT: OP clear, mucus membranes moist  SKIN: warm, dry. No rashes. Neuro: No focal deficits  Musculoskeletal: Muscle strength 5/5 all ext  Psychiatric: Mood and affect normal  Neck: No JVD, no carotid bruits, no thyromegaly, no lymphadenopathy.  Lungs:Clear bilaterally, no wheezes, rhonci, crackles Cardiovascular: Regular rate and rhythm. Soft systolic murmur. No gallops or rubs. Abdomen:Soft. Bowel sounds present. Non-tender.  Extremities: No lower extremity edema. Pulses are 2 + in the bilateral DP/PT.   Echo 01/08/17 (Preliminary Read): LVEF 35-40%. Bioprosthetic valve is working well with no AI.   EKG:  EKG is not ordered today.   Recent Labs: 12/02/2016: ALT 12 12/09/2016: Magnesium 1.9 12/10/2016: BUN 14; Creatinine, Ser 0.95; Hemoglobin 12.1; Platelets 165; Potassium 4.5; Sodium 138   Lipid Panel    Component Value Date/Time   CHOL 154 09/24/2016 0803   TRIG 55 09/24/2016 0803   HDL 58 09/24/2016 0803   CHOLHDL 2.7 09/24/2016 0803   VLDL 11 09/24/2016 0803   LDLCALC 85 09/24/2016 0803     Wt Readings from Last 3 Encounters:  01/08/17 163 lb (73.9 kg)  12/18/16 162 lb 9.6 oz (73.8 kg)  12/10/16 159 lb 9.6 oz (72.4 kg)     Other studies Reviewed: Additional studies/ records that were reviewed today include: . Review of the above records  demonstrates:   Assessment and Plan:   1. Severe aortic stenosis: He is now one month post TAVR. He is doing well. Echo today shows moderate LV systolic dysfunction, slight improvement. Normally functioning bioprosthetic AVR with no AI. He  is NYHA class 2. Will continue ASA and Plavix for at least 6 months post TAVR. Would consider Plavix alone after six months of DAPT. He will nee antibiotic prophylaxis before dental visits.   Current medicines are reviewed at length with the patient today.  The patient does not have concerns regarding medicines.  The following changes have been made:  no change  Labs/ tests ordered today include:  No orders of the defined types were placed in this encounter.  Disposition:   FU with me in one year with echo. Keep planned f/u with Dr. [name]   Signed, Lauree Chandler, MD 01/08/2017 4:43 PM    [address], [place],   [address] Phone: [phone]; Fax: [phone]

## 2017-01-08 NOTE — Patient Instructions (Signed)
Medication Instructions:  Your physician recommends that you continue on your current medications as directed. Please refer to the Current Medication list given to you today.   Labwork: none  Testing/Procedures: Your physician has requested that you have an echocardiogram. Echocardiography is a painless test that uses sound waves to create images of your heart. It provides your doctor with information about the size and shape of your heart and how well your heart's chambers and valves are working. This procedure takes approximately one hour. There are no restrictions for this procedure. To be done in 11 months on day of appointment with Dr. Angelena Form  Follow-Up: Your physician recommends that you schedule a follow-up appointment in: 11 months with Dr. Angelena Form.  We will call you to schedule this appointment    Any Other Special Instructions Will Be Listed Below (If Applicable).     If you need a refill on your cardiac medications before your next appointment, please call your pharmacy.

## 2017-01-18 ENCOUNTER — Encounter: Payer: Self-pay | Admitting: Cardiovascular Disease

## 2017-01-18 ENCOUNTER — Other Ambulatory Visit: Payer: Self-pay | Admitting: *Deleted

## 2017-01-18 MED ORDER — AMOXICILLIN 500 MG PO TABS: ORAL_TABLET | ORAL | 4 refills | Status: DC

## 2017-01-19 ENCOUNTER — Telehealth (HOSPITAL_COMMUNITY): Payer: Self-pay

## 2017-01-19 NOTE — Telephone Encounter (Signed)
S/w pt, recd our brochure still not sure about program will c/b if interested. KJ

## 2017-02-01 NOTE — Telephone Encounter (Signed)
Received message that pt had not read message.  I called and spoke with pt and gave him information in message. He reports he has picked up the amoxicillin prescription

## 2017-02-01 NOTE — Addendum Note (Signed)
Addendum  created 02/01/17 1337 by Oleta Mouse, MD   Sign clinical note

## 2017-02-09 ENCOUNTER — Telehealth: Payer: Self-pay | Admitting: Cardiovascular Disease

## 2017-02-09 ENCOUNTER — Ambulatory Visit (INDEPENDENT_AMBULATORY_CARE_PROVIDER_SITE_OTHER): Payer: Medicare Other | Admitting: Family Medicine

## 2017-02-09 ENCOUNTER — Encounter: Payer: Self-pay | Admitting: Family Medicine

## 2017-02-09 VITALS — BP 108/68 | HR 64 | Temp 97.4°F | Wt 159.8 lb

## 2017-02-09 DIAGNOSIS — M1711 Unilateral primary osteoarthritis, right knee: Secondary | ICD-10-CM | POA: Diagnosis not present

## 2017-02-09 DIAGNOSIS — I959 Hypotension, unspecified: Secondary | ICD-10-CM

## 2017-02-09 DIAGNOSIS — I5022 Chronic systolic (congestive) heart failure: Secondary | ICD-10-CM

## 2017-02-09 DIAGNOSIS — R42 Dizziness and giddiness: Secondary | ICD-10-CM

## 2017-02-09 LAB — CBC WITH DIFFERENTIAL/PLATELET
BASOS ABS: 0.2 10*3/uL — AB (ref 0.0–0.1)
BASOS PCT: 2.8 % (ref 0.0–3.0)
EOS ABS: 0.4 10*3/uL (ref 0.0–0.7)
Eosinophils Relative: 7.2 % — ABNORMAL HIGH (ref 0.0–5.0)
HCT: 35.9 % — ABNORMAL LOW (ref 39.0–52.0)
HEMOGLOBIN: 12.1 g/dL — AB (ref 13.0–17.0)
Lymphocytes Relative: 17 % (ref 12.0–46.0)
Lymphs Abs: 1 10*3/uL (ref 0.7–4.0)
MCHC: 33.7 g/dL (ref 30.0–36.0)
MCV: 87.8 fl (ref 78.0–100.0)
MONO ABS: 0.6 10*3/uL (ref 0.1–1.0)
Monocytes Relative: 10.3 % (ref 3.0–12.0)
Neutro Abs: 3.7 10*3/uL (ref 1.4–7.7)
Neutrophils Relative %: 62.7 % (ref 43.0–77.0)
Platelets: 209 10*3/uL (ref 150.0–400.0)
RBC: 4.09 Mil/uL — ABNORMAL LOW (ref 4.22–5.81)
RDW: 14 % (ref 11.5–15.5)
WBC: 5.9 10*3/uL (ref 4.0–10.5)

## 2017-02-09 LAB — BASIC METABOLIC PANEL
BUN: 21 mg/dL (ref 6–23)
CHLORIDE: 103 meq/L (ref 96–112)
CO2: 31 mEq/L (ref 19–32)
Calcium: 9.3 mg/dL (ref 8.4–10.5)
Creatinine, Ser: 1.03 mg/dL (ref 0.40–1.50)
GFR: 72.53 mL/min (ref >60.00)
GLUCOSE: 85 mg/dL (ref 70–99)
POTASSIUM: 4.5 meq/L (ref 3.5–5.1)
SODIUM: 137 meq/L (ref 135–145)

## 2017-02-09 NOTE — Telephone Encounter (Signed)
New message    Pt c/o BP issue: STAT if pt c/o blurred vision, one-sided weakness or slurred speech  1. What are your last 5 BP readings? 84/47  2. Are you having any other symptoms (ex. Dizziness, headache, blurred vision, passed out)? She doesn't know.   3. What is your BP issue? Jonathan Graham states that pt is becoming hypotensive and wants pt seen this week.

## 2017-02-09 NOTE — Telephone Encounter (Signed)
Spoke with pt, he reports his bp was low yesterday for a period of time but it came back up. He reports this has happened before. Today he feels fine. He has an appointment with his medical doctor this morning and he says he is fine and does not need anything at this time. He is aware of his appt at [address] 02-18-17. He will have his medical doctor call with concerns.

## 2017-02-09 NOTE — Progress Notes (Signed)
Subjective:     Patient ID: Jonathan Graham, male   DOB: [birth year], 83 y.o.   MRN: [id number]  HPI Patient was scheduled be seen secondary to concern for hypotensive episode yesterday morning. His past history is that he has history of prostate cancer remotely, osteoarthritis, chronic systolic heart failure, aortic stenosis, hyperlipidemia. He underwent TAVR April 10 and did well without complication. He had echocardiogram in early May which showed stable ejection fraction around 30-35%. Patient takes minimal medications which include aspirin, Plavix, simvastatin, Flomax. No blood pressure medications.  Yesterday morning he got up and after going out to eat for breakfast had episode of dizziness with blood pressure 85/47. This eventually came up to 112/62. No syncope. No chest pains. No dyspnea. Denies recent fever or chills. No nausea, vomiting, or diarrhea. No dysuria. No cough.  Denies any dizziness this morning. He has follow-up scheduled with cardiology June 21.  Patient has history of mild anemia by recent labs.  Past Medical History:  Diagnosis Date  . Arthritis   . Atypical chest pain    history of   . Back pain   . Cancer [facility]) 2014   prostate  . Cardiovascular stress test abnormal 04/25/2004   Abnormal adenosine Cardiolite study / evidence of inferoseptal ischemia &  an EF of 48% ""recommended that he undergo cardiac catheterization""  . Cervical spine disease   . Dizziness    occasionally apon bending over  . Fatigue    history of   . Heart murmur   . History of hiatal hernia   . History of mononucleosis   . Hypercholesterolemia   . Left bundle branch block   . Pleurisy 07/2005  . S/P radiation therapy  12/05/2012 through 01/30/2013                                                         Prostate 7800 cGy 40 sessions, seminal vesicles 5600 cGy 40 sessions                       . Shortness of breath   . Spondylosis, cervical    at C3-C4  . Varicose veins   . Weakness     history of    Past Surgical History:  Procedure Laterality Date  . BACK SURGERY  1995    2 ruptured discs in lower back / by Dr. Louanne Skye  . CARDIAC CATHETERIZATION  2005   EF estimated at 50% /  PROCEDURE:  Left heart catheterization, coronary and left ventricular angiography /  RAO view demonstrates mild LV enlargement  / mild hypokinesia anteroseptal with  overall low normal LV systolic function / Normal coronary anatomy  . HEMORRHOID SURGERY    . PROSTATE BIOPSY  08/25/2012   Gleason 3+3=6 PSA 7.80  . RIGHT/LEFT HEART CATH AND CORONARY ANGIOGRAPHY N/A 11/13/2016   Procedure: Right/Left Heart Cath and Coronary Angiography;  Surgeon: [name] [name], MD;  Location: [place] [facility];  Service: Cardiovascular;  Laterality: N/A;  . TEE WITHOUT CARDIOVERSION N/A 12/08/2016   Procedure: TRANSESOPHAGEAL ECHOCARDIOGRAM (TEE);  Surgeon: Burnell Blanks, MD;  Location: [place];  Service: Open Heart Surgery;  Laterality: N/A;  . TONSILLECTOMY AND ADENOIDECTOMY    . TRANSCATHETER AORTIC VALVE REPLACEMENT, TRANSFEMORAL N/A 12/08/2016   Procedure: TRANSCATHETER AORTIC  VALVE REPLACEMENT, TRANSFEMORAL;  Surgeon: Burnell Blanks, MD;  Location: [place];  Service: Open Heart Surgery;  Laterality: N/A;    reports that he quit smoking about 65 years ago. His smoking use included Cigarettes. He has a 7.00 pack-year smoking history. He has never used smokeless tobacco. He reports that he does not drink alcohol or use drugs. family history includes Cancer in his father; Heart disease in his mother; Heart failure in his mother; Lung cancer in his brother; Multiple sclerosis in his sister; Stroke in his father. Allergies  Allergen Reactions  . Lipitor [Atorvastatin Calcium] Other (See Comments)    MYALGIAS CRAMPS     Review of Systems  Constitutional: Negative for chills, fatigue and fever.  Eyes: Negative for visual disturbance.  Respiratory: Negative for cough, chest tightness and shortness of  breath.   Cardiovascular: Negative for chest pain and palpitations.  Gastrointestinal: Negative for abdominal pain.  Endocrine: Negative for polydipsia and polyuria.  Genitourinary: Negative for dysuria.  Neurological: Positive for light-headedness. Negative for dizziness, syncope, weakness and headaches.       Objective:   Physical Exam  Constitutional: He is oriented to person, place, and time. He appears well-developed and well-nourished.  Neck: Neck supple.  Cardiovascular: Normal rate and regular rhythm.   Pulmonary/Chest: Effort normal and breath sounds normal. No respiratory distress. He has no wheezes. He has no rales.  Musculoskeletal:  Significant varicosities lower extremities but no pitting edema  Neurological: He is alert and oriented to person, place, and time.       Assessment:     #1 history of aortic stenosis with recent TAVR  #2 episode of lightheadedness yesterday with reported blood pressure 85/47. Blood pressure today seated 98/60 and standing 108/60. Patient asymptomatic today    Plan:     -Check CBC and basic metabolic panel -He is encouraged when changing positions such as seated this standing to get up slowly. -Continue support hose lower extremities -Stay well-hydrated but avoid overhydration secondary to systolic heart failure. We recommend daily body weights  [name] MD [facility]

## 2017-02-09 NOTE — Patient Instructions (Signed)
Continue with daily weights and be in touch for weight gain > 3 pounds per day or 5 pounds per week. When getting up to change positions stand for a moment before walking Stay well hydrated.   Avoid prolonged showers.

## 2017-02-16 DIAGNOSIS — M1711 Unilateral primary osteoarthritis, right knee: Secondary | ICD-10-CM | POA: Diagnosis not present

## 2017-02-17 NOTE — Progress Notes (Signed)
Cardiology Office Note    Date:  02/18/2017   ID:  Jonathan Graham, DOB 12/09/1927, MRN [id number]  PCP:  [name], MD  Cardiologist: Dr. [name] TAVR: Dr. Angelena Form  Chief Complaint  Patient presents with  . Dizziness    History of Present Illness:  Jonathan Graham is a 83 y.o. male with history of CAD, nonischemic cardiomyopathy, HLD, LBBB, severe aortic stenosis status post TAVR 12/08/16 with a bioprosthetic aVR. LVEF improved slightly at 30-35% from 25-30% post-valve replacement. Saw Dr. Angelena Form 12/09/16 and doing well. Recommend Plavix and ASA 6 months then Plavix alone. Antibiotic prophylaxis. Cardiac catheterization 11/13/1848% OM1 lesion otherwise mild nonobstructive CAD.  Patient is being seen today because of orthostatic hypotension. Patient became dizzy and blood pressure dropped to 85 systolic. He saw primary care and had blood work checked 02/09/17. Potassium 4.5 BUN 21 creatinine 1.03 hemoglobin 12.1. Glucose 85.She doesn't take any medications that would lower his blood pressure. He is not drinking enough water. He drinks 2 cups of coffee daily but no alcohol. He continues to have dizziness when he transitions from lying to sitting or sitting to standing. He has not fallen or had syncope. He's had this problem for many years off and on.    Past Medical History:  Diagnosis Date  . Arthritis   . Atypical chest pain    history of   . Back pain   . Cancer [facility]) 2014   prostate  . Cardiovascular stress test abnormal 04/25/2004   Abnormal adenosine Cardiolite study / evidence of inferoseptal ischemia &  an EF of 48% ""recommended that he undergo cardiac catheterization""  . Cervical spine disease   . Dizziness    occasionally apon bending over  . Fatigue    history of   . Heart murmur   . History of hiatal hernia   . History of mononucleosis   . Hypercholesterolemia   . Left bundle branch block   . Pleurisy 07/2005  . S/P radiation therapy  12/05/2012  through 01/30/2013                                                         Prostate 7800 cGy 40 sessions, seminal vesicles 5600 cGy 40 sessions                       . Shortness of breath   . Spondylosis, cervical    at C3-C4  . Varicose veins   . Weakness    history of     Past Surgical History:  Procedure Laterality Date  . BACK SURGERY  1995    2 ruptured discs in lower back / by Dr. Louanne Skye  . CARDIAC CATHETERIZATION  2005   EF estimated at 50% /  PROCEDURE:  Left heart catheterization, coronary and left ventricular angiography /  RAO view demonstrates mild LV enlargement  / mild hypokinesia anteroseptal with  overall low normal LV systolic function / Normal coronary anatomy  . HEMORRHOID SURGERY    . PROSTATE BIOPSY  08/25/2012   Gleason 3+3=6 PSA 7.80  . RIGHT/LEFT HEART CATH AND CORONARY ANGIOGRAPHY N/A 11/13/2016   Procedure: Right/Left Heart Cath and Coronary Angiography;  Surgeon: [name] [name], MD;  Location: [place] CV LAB;  Service: Cardiovascular;  Laterality: N/A;  .  TEE WITHOUT CARDIOVERSION N/A 12/08/2016   Procedure: TRANSESOPHAGEAL ECHOCARDIOGRAM (TEE);  Surgeon: Burnell Blanks, MD;  Location: [place];  Service: Open Heart Surgery;  Laterality: N/A;  . TONSILLECTOMY AND ADENOIDECTOMY    . TRANSCATHETER AORTIC VALVE REPLACEMENT, TRANSFEMORAL N/A 12/08/2016   Procedure: TRANSCATHETER AORTIC VALVE REPLACEMENT, TRANSFEMORAL;  Surgeon: Burnell Blanks, MD;  Location: [place];  Service: Open Heart Surgery;  Laterality: N/A;    Current Medications: Outpatient Medications Prior to Visit  Medication Sig Dispense Refill  . amoxicillin (AMOXIL) 500 MG tablet Take 4 tablets by mouth 30-60 minutes prior to dental procedure 4 tablet 4  . aspirin EC 81 MG tablet Take 81 mg by mouth daily.    . clopidogrel (PLAVIX) 75 MG tablet Take 1 tablet (75 mg total) by mouth daily with breakfast. 30 tablet 6  . simvastatin (ZOCOR) 10 MG tablet Take 5 mg by mouth daily at 6 PM.      . tamsulosin (FLOMAX) 0.4 MG CAPS capsule Take 0.4 mg by mouth daily. Daily during the evening     No facility-administered medications prior to visit.      Allergies:   Lipitor [atorvastatin calcium]   Social History   Social History  . Marital status: Married    Spouse name: N/A  . Number of children: 2  . 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 Smoking status: Former Smoker    Packs/day: 1.00    Years: 7.00    Types: Cigarettes    Quit date: 09/01/1951  . Smokeless tobacco: Never Used  . Alcohol use No  . Drug use: No  . Sexual activity: Not on file   Other Topics Concern  . Not on file   Social History Narrative  . No narrative on file     Family History:  The patient's family history includes Cancer in his father; Heart disease in his mother; Heart failure in his mother; Lung cancer in his brother; Multiple sclerosis in his sister; Stroke in his father.   ROS:   Please see the history of present illness.    Review of Systems  Constitution: Negative.  HENT: Negative.   Cardiovascular: Positive for dyspnea on exertion.  Respiratory: Negative.   Endocrine: Negative.   Hematologic/Lymphatic: Bruises/bleeds easily.  Musculoskeletal: Negative.   Gastrointestinal: Negative.   Genitourinary: Positive for frequency.  Neurological: Negative.    All other systems reviewed and are negative.   PHYSICAL EXAM:   VS:  BP 109/63   Pulse 64   Ht 5\' 9"  (1.753 m)   Wt 160 lb (72.6 kg)   BMI 23.63 kg/m   Physical Exam  GEN: Well nourished, well developed, in no acute distress  Neck: no JVD, carotid bruits, or masses Cardiac:RRR; no murmurs, rubs, or gallops  Respiratory:  clear to auscultation bilaterally, normal work of breathing GI: soft, nontender, nondistended, + BS Ext: Significant varicosities of the right leg without cyanosis, clubbing, or edema, Good distal pulses bilaterally  Skin:  warm and dry, no rash, positive tenting Neuro:  Alert and Oriented x 3 Psych: euthymic mood, full affect  Wt Readings from Last 3 Encounters:  02/18/17 160 lb (72.6 kg)  02/09/17 159 lb 12.8 oz (72.5 kg)  01/08/17 163 lb (73.9 kg)      Studies/Labs Reviewed:   EKG:  EKG is notordered today.     Recent Labs: 12/02/2016: ALT 12  12/09/2016: Magnesium 1.9 02/09/2017: BUN 21; Creatinine, Ser 1.03; Hemoglobin 12.1; Platelets 209.0; Potassium 4.5; Sodium 137   Lipid Panel    Component Value Date/Time   CHOL 154 09/24/2016 0803   TRIG 55 09/24/2016 0803   HDL 58 09/24/2016 0803   CHOLHDL 2.7 09/24/2016 0803   VLDL 11 09/24/2016 0803   LDLCALC 85 09/24/2016 0803    Additional studies/ records that were reviewed today include:  2-D echo 5/11/18Study Conclusions   - Left ventricle: The cavity size was normal. Wall thickness was   normal. Systolic function was moderately to severely reduced. The   estimated ejection fraction was in the range of 30% to 35%. Wall   motion was normal; there were no regional wall motion   abnormalities. Doppler parameters are consistent with abnormal   left ventricular relaxation (grade 1 diastolic dysfunction). - Ventricular septum: Septal motion showed abnormal function and   dyssynergy. - Aortic valve: A TAVR bioprosthesis was present and functioning   normally. Mean gradient (S): 12 mm Hg. Peak gradient (S): 26 mm   Hg. - Mitral valve: Calcified annulus. - Left atrium: The atrium was mildly dilated. - Pulmonary arteries: Systolic pressure was moderately increased.   PA peak pressure: 50 mm Hg (S).   Impressions:   - Compared to the prior study, there has been no significant   interval change.   Echo 10/15/2016 LV EF: 25% -   30%   - Left ventricle: The cavity size was normal. Wall thickness was   increased in a pattern of mild LVH. Systolic function was   severely reduced. The estimated ejection fraction was in the   range of 25% to 30%.  Doppler parameters are consistent with   abnormal left ventricular relaxation (grade 1 diastolic   dysfunction). - Aortic valve: Moderately to severely calcified annulus.   Moderately thickened, moderately calcified leaflets. Valve area   (VTI): 1.27 cm^2. Valve area (Vmax): 1.3 cm^2. Valve area   (Vmean): 1.26 cm^2. - Mitral valve: There was mild regurgitation. - Right atrium: The atrium was moderately dilated.   Impressions:   - The arotic valve is heavily calcified with restricted leaflet   mobility.   The peak gradient is 32 with a mean gradient of 20. The LV   function is severely reduced .   Consider low gradient / low cardiac output severe aortic   stenosis.     Cath 11/13/2016 Conclusion       Ost 1st Mrg to 1st Mrg lesion, 50 %stenosed.  There is moderate to severe left ventricular systolic dysfunction.  LV end diastolic pressure is normal.  The left ventricular ejection fraction is 25-35% by visual estimate.  LV end diastolic pressure is normal.  There is moderate aortic valve stenosis.   1. Mild nonobstructive CAD 2. Normal right heart pressures 3. Normal LV filling pressures  4. Moderate Aortic stenosis with mean gradient of 26 mm Hg. AV area may be underestimated due to low EF.    Plan: consider AVR/TAVR.           TEE 12/08/2016 LV EF: 30% -   35%   - Left ventricle: There was mild concentric hypertrophy. Systolic   function was moderately to severely reduced. The estimated   ejection fraction was in the range of 30% to 35%. Wall motion was   normal; there were no regional wall motion abnormalities. - Aortic valve: Cusp separation was severely reduced. There was   severe stenosis. There was trivial regurgitation. -  Mitral valve: There was mild regurgitation. - Left atrium: The atrium was dilated. No evidence of thrombus in   the atrial cavity or appendage. No evidence of thrombus in the   atrial cavity or appendage. No evidence of thrombus in the  atrial   cavity or appendage. - Right atrium: No evidence of thrombus in the atrial cavity or   appendage.   Impressions:   - This was a perioperative TEE during a TAVR procedure.     LVEF was severely decreased estimated at 30%.   The native aortic valve was severely thickened and calcified with   severely restricted leaflet openings. There was asymmetric   calcification in the non-coronary cusp.   The peak/mean transaortic gradients were 45/26 mmHg. This was a   low LVEF/low flow severe aortic stenosis as LVEF severely   impaired estimated at 30%.   A 29 mm Edwards-SAPIEN 3 valve was successfully deployed in the   aortic position.   Post deployment peak/mean gradients were 16/7 mmHg. There was no   central aortic regurgitation and only trivial paravalvular leak.   Mitral regurgitation was mild prior and post deployment.   Tricuspid regurgitation was mild prior and post deployment.   No pericardial effusion.     TAVR 12/08/2016 Pre-operative Echo Findings:  Severe aortic stenosis  Normal left ventricular systolic function   Post-operative Echo Findings:  Trivial paravalvular leak  Normal left ventricular systolic function   Transcatheter Aortic Valve Replacement - Transfemoral Approach             Edwards Sapien 3 THV (size 29 mm, model # [id number], serial # [id number])     Echo 12/10/2016 LV EF: 30% -   35%   - Left ventricle: The cavity size was normal. Systolic function was   moderately to severely reduced. The estimated ejection fraction   was in the range of 30% to 35%. Diffuse hypokinesis. Doppler   parameters are consistent with abnormal left ventricular   relaxation (grade 1 diastolic dysfunction). - Aortic valve: A TAVR bioprosthesis was present. There was mild   perivalvular regurgitation septal region as well as trace mid   valvular regurgitation. Peak velocity (S): 228 cm/s. Mean   gradient (S): 11 mm Hg. - Mitral valve: Moderately calcified annulus. -  Right atrium: The atrium was severely dilated. - Tricuspid valve: There was trivial regurgitation.   Impressions:   - TAVR valve is new. EF remains reduced.     ASSESSMENT:    1. Orthostatic hypotension   2. Severe aortic stenosis   3. Chronic systolic CHF (congestive heart failure) ([place])   4. Varicose vein of leg      PLAN:  In order of problems listed above:  Orthostatic hypotension patient continues to be orthostatic.I believe he is dehydrated. Encourage water and Gatorade, take his time transitioning from lying to sitting to standing, compression hose. If these measures don't help I asked him to come back. He may need an abdominal binder. With LV dysfunction I wouldn't prescribe anything else at this time.  Severe aortic stenosis status post have her in 11/2016 with good results.  Chronic systolic CHF well compensated. Slight improvement in LV function on follow-up echo.  Varicose veins have given him a prescription for compression stockings     Medication Adjustments/Labs and Tests Ordered: Current medicines are reviewed at length with the patient today.  Concerns regarding medicines are outlined above.  Medication changes, Labs and Tests ordered today are listed in the Patient Instructions  below. Patient Instructions  Medication Instructions:   Your physician recommends that you continue on your current medications as directed. Please refer to the Current Medication list given to you today.   If you need a refill on your cardiac medications before your next appointment, please call your pharmacy.  Labwork: NONE ORDERED  TODAY    Testing/Procedures: NONE ORDERED  TODAY    Follow-Up: AS SCHEDULED WITH DR [name]    Any Other Special Instructions Will Be Listed Below (If Applicable).   MAKE SURE YOU DRINK PLENTY OF CLEAR FLUIDS AND KEEP YOUR SELF HYDRATED  COMPRESSION STOCKINGS  WEAR DAILY AND TAKE TIME GETTING UP FROM SITTING                                                                                                                                                    Signed, Ermalinda Barrios, PA-C  02/18/2017 9:28 AM    [facility] [address], [place], [place]  [address] Phone: [phone]; Fax: [phone]

## 2017-02-18 ENCOUNTER — Encounter: Payer: Self-pay | Admitting: Physician Assistant

## 2017-02-18 ENCOUNTER — Ambulatory Visit (INDEPENDENT_AMBULATORY_CARE_PROVIDER_SITE_OTHER): Payer: Medicare Other | Admitting: Physician Assistant

## 2017-02-18 VITALS — BP 109/63 | HR 64 | Ht 69.0 in | Wt 160.0 lb

## 2017-02-18 DIAGNOSIS — I951 Orthostatic hypotension: Secondary | ICD-10-CM

## 2017-02-18 DIAGNOSIS — I8390 Asymptomatic varicose veins of unspecified lower extremity: Secondary | ICD-10-CM | POA: Diagnosis not present

## 2017-02-18 DIAGNOSIS — I5022 Chronic systolic (congestive) heart failure: Secondary | ICD-10-CM

## 2017-02-18 DIAGNOSIS — I350 Nonrheumatic aortic (valve) stenosis: Secondary | ICD-10-CM | POA: Diagnosis not present

## 2017-02-18 NOTE — Patient Instructions (Signed)
Medication Instructions:   Your physician recommends that you continue on your current medications as directed. Please refer to the Current Medication list given to you today.   If you need a refill on your cardiac medications before your next appointment, please call your pharmacy.  Labwork: NONE ORDERED  TODAY    Testing/Procedures: NONE ORDERED  TODAY    Follow-Up: AS SCHEDULED WITH DR [name]    Any Other Special Instructions Will Be Listed Below (If Applicable).   MAKE SURE YOU DRINK PLENTY OF CLEAR FLUIDS AND KEEP YOUR SELF HYDRATED  COMPRESSION STOCKINGS  WEAR DAILY AND TAKE TIME GETTING UP FROM SITTING

## 2017-02-23 DIAGNOSIS — M1711 Unilateral primary osteoarthritis, right knee: Secondary | ICD-10-CM | POA: Diagnosis not present

## 2017-02-24 ENCOUNTER — Telehealth: Payer: Self-pay | Admitting: Cardiovascular Disease

## 2017-02-24 NOTE — Telephone Encounter (Signed)
Spoke with pt's wife to let her know that Dr. Angelena Form okay to give pt a prescription for a "thigh high hose". Wife states that she will try to see if the store where she got the calf hose can take the same prescription and  change it to a thigh compression stockings. Pt will call back tomorrow,  If she needs the new prescription, because now [place] is in [place] and is unable to come for the new prescription.

## 2017-02-24 NOTE — Telephone Encounter (Signed)
Can we give them a prescription for the thigh high hose? Thanks, chris

## 2017-02-24 NOTE — Telephone Encounter (Signed)
New message    Pt wife is calling asking for a call back. It's about compression stockings. She said he wont be able to wear them because they hit on where he has varicose veins.

## 2017-02-24 NOTE — Telephone Encounter (Signed)
Pt's wife called she states that on his 6/21/ [employer] PA order Knee high compression stockings. Pt got them yesterday he tried them the hose make an indentation on his varicose veins, and is very uncomfortable. Pt won't be able to wear them. Pt's wife would like to know if pt can get a prescription for the compression hose that can go above the knee.

## 2017-03-01 ENCOUNTER — Encounter: Payer: Self-pay | Admitting: Family Medicine

## 2017-03-01 ENCOUNTER — Ambulatory Visit (INDEPENDENT_AMBULATORY_CARE_PROVIDER_SITE_OTHER): Payer: Medicare Other | Admitting: Family Medicine

## 2017-03-01 VITALS — BP 102/70 | HR 73 | Temp 98.1°F | Wt 161.4 lb

## 2017-03-01 DIAGNOSIS — R509 Fever, unspecified: Secondary | ICD-10-CM | POA: Diagnosis not present

## 2017-03-01 NOTE — Patient Instructions (Signed)
Follow up for any recurrent fever, chills, or other concerns.

## 2017-03-02 NOTE — Progress Notes (Signed)
Subjective:     Patient ID: Jonathan Graham, male   DOB: 1927-10-07, 83 y.o.   MRN: [id number]  HPI Patient presents today with transient fever. He states over the past 4 years or so he had about 5 separate episodes of shaking chill and transient fever lasting couple hours. Most recent episode occurred Saturday night. This occurred shortly after midnight. He woke up with chills followed by fever up 102.7 and this lasted approximately 2 hours. He felt generally weak afterwards on Sunday but no persistent fever. He denies any recent headache, sinusitis symptoms, urinary symptoms, abdominal pain, nausea, vomiting, diarrhea, cough, arthralgias, or any skin rash.  He states most recent episode was about 9 months prior. He has been evaluated a couple times in the ER and workup has been consistently unrevealing. He has history of history of recent aortic valve surgery and was concerned because of that. He has not had any recent chest pains. No dyspnea. Remote history of prostate cancer treated with radiation therapy. No history of acute prostatitis. Appetite is stable. Basically feels back to baseline at this time.  Past Medical History:  Diagnosis Date  . Arthritis   . Atypical chest pain    history of   . Back pain   . Cancer [facility]) 2014   prostate  . Cardiovascular stress test abnormal 04/25/2004   Abnormal adenosine Cardiolite study / evidence of inferoseptal ischemia &  an EF of 48% ""recommended that he undergo cardiac catheterization""  . Cervical spine disease   . Dizziness    occasionally apon bending over  . Fatigue    history of   . Heart murmur   . History of hiatal hernia   . History of mononucleosis   . Hypercholesterolemia   . Left bundle branch block   . Pleurisy 07/2005  . S/P radiation therapy  12/05/2012 through 01/30/2013                                                         Prostate 7800 cGy 40 sessions, seminal vesicles 5600 cGy 40 sessions                       .  Shortness of breath   . Spondylosis, cervical    at C3-C4  . Varicose veins   . Weakness    history of    Past Surgical History:  Procedure Laterality Date  . BACK SURGERY  1995    2 ruptured discs in lower back / by Dr. Louanne Skye  . CARDIAC CATHETERIZATION  2005   EF estimated at 50% /  PROCEDURE:  Left heart catheterization, coronary and left ventricular angiography /  RAO view demonstrates mild LV enlargement  / mild hypokinesia anteroseptal with  overall low normal LV systolic function / Normal coronary anatomy  . HEMORRHOID SURGERY    . PROSTATE BIOPSY  08/25/2012   Gleason 3+3=6 PSA 7.80  . RIGHT/LEFT HEART CATH AND CORONARY ANGIOGRAPHY N/A 11/13/2016   Procedure: Right/Left Heart Cath and Coronary Angiography;  Surgeon: [name] [name], MD;  Location: [place] [facility];  Service: Cardiovascular;  Laterality: N/A;  . TEE WITHOUT CARDIOVERSION N/A 12/08/2016   Procedure: TRANSESOPHAGEAL ECHOCARDIOGRAM (TEE);  Surgeon: Burnell Blanks, MD;  Location: [place];  Service: Open Heart Surgery;  Laterality: N/A;  . TONSILLECTOMY AND ADENOIDECTOMY    . TRANSCATHETER AORTIC VALVE REPLACEMENT, TRANSFEMORAL N/A 12/08/2016   Procedure: TRANSCATHETER AORTIC VALVE REPLACEMENT, TRANSFEMORAL;  Surgeon: Burnell Blanks, MD;  Location: [place];  Service: Open Heart Surgery;  Laterality: N/A;    reports that he quit smoking about 65 years ago. His smoking use included Cigarettes. He has a 7.00 pack-year smoking history. He has never used smokeless tobacco. He reports that he does not drink alcohol or use drugs. family history includes Cancer in his father; Heart disease in his mother; Heart failure in his mother; Lung cancer in his brother; Multiple sclerosis in his sister; Stroke in his father. Allergies  Allergen Reactions  . Lipitor [Atorvastatin Calcium] Other (See Comments)    MYALGIAS CRAMPS     Review of Systems  Constitutional: Positive for chills, fatigue and fever. Negative for  appetite change.  HENT: Negative for congestion, ear pain, sinus pain, sinus pressure and sore throat.   Respiratory: Negative for cough.   Cardiovascular: Negative for chest pain.  Gastrointestinal: Negative for abdominal pain, diarrhea, nausea and vomiting.  Genitourinary: Negative for dysuria and flank pain.  Musculoskeletal: Negative for back pain.  Skin: Negative for rash.  Neurological: Negative for weakness.  Hematological: Negative for adenopathy.       Objective:   Physical Exam  Constitutional: He appears well-developed and well-nourished.  HENT:  Right Ear: External ear normal.  Left Ear: External ear normal.  Mouth/Throat: Oropharynx is clear and moist. No oropharyngeal exudate.  Neck: Neck supple.  Cardiovascular: Normal rate and regular rhythm.   Pulmonary/Chest: Effort normal and breath sounds normal. No respiratory distress. He has no wheezes. He has no rales.  Abdominal: Soft. He exhibits no mass. There is no tenderness. There is no rebound and no guarding.  Musculoskeletal: He exhibits no edema.  Lymphadenopathy:    He has no cervical adenopathy.  Skin: No rash noted.       Assessment:     Recurrent transient fever. Patient afebrile as time and feels back to baseline. He's had recurrence of this for about 4 or 5 years now and symptoms were very few and far between. This makes etiology such as malignancy or connective tissue disorder very much less likely    Plan:     -We discussed possible further evaluation including possible infectious disease consult but at this point he wished to observe. -We elected not to give chest x-ray, urinalysis, further labs at this point since he is totally asymptomatic and similar testing in the past has been completely normal  [name] MD [facility] at [facility]

## 2017-03-08 NOTE — Telephone Encounter (Signed)
I called the pt to let him know that Dr. Angelena Form has signed an new form for the compression stockings for the thigh high. I s/w pt's wife (DPR) today about new form. I have asked pt's wife if she would like for me to mail this form to them. Pt's wife said that would be great. I verified pt address and placed compression stocking form in the mail. Pt's wife asked if there was another place to go than the place they went to in [facility]. I gave her [facility] which is in [place], [place]. Pt's wife thanked me for the help today.

## 2017-04-22 ENCOUNTER — Other Ambulatory Visit: Payer: Self-pay | Admitting: Cardiology

## 2017-05-14 ENCOUNTER — Encounter: Payer: Self-pay | Admitting: Cardiology

## 2017-05-17 NOTE — Progress Notes (Addendum)
Subjective:   Jonathan Graham is a 83 y.o. male who presents for Medicare Annual/Subsequent preventive examination.  The Patient was informed that the wellness visit is to identify future health risk and educate and initiate measures that can reduce risk for increased disease through the lifespan.    Annual Wellness Assessment  Reports health as   States something happens to him and starts shaking; temp goes up to 104; no warning / has not happened in awhile  Preventive Screening -Counseling & Management  Medicare Annual Preventive Care Visit - Subsequent Last OV  June and July  Valve replacement  - 12/08/2016 this year  Health Maintenance Due  Topic Date Due  . INFLUENZA VACCINE  03/31/2017  . PNA vac Low Risk Adult (2 of 2 - PPSV23) 04/28/2017   PSA 12/2016 Colonoscopy aged out Smoking hx; No aneurysm or dissection noted in the abdominal or pelvic vasculature. 11/2016  VS reviewed;   Diet  Breakfast bowl of cereal; muffin; bagel  panera  Bread for breakfast as well  Lunch; Chicken salad on cracker Super eats flounder; potato and veg   BMI 23  Exercise Can still split wood for downstairs furnace Will get wood pile stocked in Oct  Does get help with yard   Dental - no issues   Stressors: no  Getting up to the bathroom to void  Dr. Risa Grill and will fup with him soon  Sleep patterns: sleeps well     Cardiac Risk Factors Addressed Hyperlipidemia - chol 154; hdl 58; LDL 85; trig 55 Diabetes -neg Obesity neg   Advanced Directives completed   Patient Care Team: [name], MD as PCP - General (Family Medicine)    Cardiac Risk Factors include: advanced age (>51men, >83 women);family history of premature cardiovascular disease;male gender     Objective:    Vitals: BP 110/60   Pulse 68   Ht 5\' 9"  (1.753 m)   Wt 162 lb (73.5 kg)   SpO2 98%   BMI 23.92 kg/m   Body mass index is 23.92 kg/m.  Tobacco History  Smoking Status  . Former Smoker   . Packs/day: 1.00  . Years: 2.00  . Types: Cigarettes  . Start date: 08/31/1949  . Quit date: 09/01/1951  Smokeless Tobacco  . Never Used     Counseling given: Yes   Past Medical History:  Diagnosis Date  . Arthritis   . Atypical chest pain    history of   . Back pain   . Cancer [facility]) 2014   prostate  . Cardiovascular stress test abnormal 04/25/2004   Abnormal adenosine Cardiolite study / evidence of inferoseptal ischemia &  an EF of 48% ""recommended that he undergo cardiac catheterization""  . Cervical spine disease   . Dizziness    occasionally apon bending over  . Fatigue    history of   . Heart murmur   . History of hiatal hernia   . History of mononucleosis   . Hypercholesterolemia   . Left bundle branch block   . Pleurisy 07/2005  . S/P radiation therapy  12/05/2012 through 01/30/2013                                                         Prostate 7800 cGy 40 sessions, seminal vesicles 5600 cGy  40 sessions                       . Shortness of breath   . Spondylosis, cervical    at C3-C4  . Varicose veins   . Weakness    history of    Past Surgical History:  Procedure Laterality Date  . BACK SURGERY  1995    2 ruptured discs in lower back / by Dr. Louanne Skye  . CARDIAC CATHETERIZATION  2005   EF estimated at 50% /  PROCEDURE:  Left heart catheterization, coronary and left ventricular angiography /  RAO view demonstrates mild LV enlargement  / mild hypokinesia anteroseptal with  overall low normal LV systolic function / Normal coronary anatomy  . HEMORRHOID SURGERY    . PROSTATE BIOPSY  08/25/2012   Gleason 3+3=6 PSA 7.80  . RIGHT/LEFT HEART CATH AND CORONARY ANGIOGRAPHY N/A 11/13/2016   Procedure: Right/Left Heart Cath and Coronary Angiography;  Surgeon: [name] [name], MD;  Location: [place] [facility];  Service: Cardiovascular;  Laterality: N/A;  . TEE WITHOUT CARDIOVERSION N/A 12/08/2016   Procedure: TRANSESOPHAGEAL ECHOCARDIOGRAM (TEE);  Surgeon: Burnell Blanks, MD;  Location: [place];  Service: Open Heart Surgery;  Laterality: N/A;  . TONSILLECTOMY AND ADENOIDECTOMY    . TRANSCATHETER AORTIC VALVE REPLACEMENT, TRANSFEMORAL N/A 12/08/2016   Procedure: TRANSCATHETER AORTIC VALVE REPLACEMENT, TRANSFEMORAL;  Surgeon: Burnell Blanks, MD;  Location: [place];  Service: Open Heart Surgery;  Laterality: N/A;   Family History  Problem Relation Age of Onset  . Stroke Father   . Cancer Father   . Heart failure Mother   . Heart disease Mother   . Multiple sclerosis Sister   . Lung cancer Brother    History  Sexual Activity  . Sexual activity: Not on file    Outpatient Encounter Prescriptions as of 05/18/2017  Medication Sig  . amoxicillin (AMOXIL) 500 MG tablet Take 4 tablets by mouth 30-60 minutes prior to dental procedure  . aspirin EC 81 MG tablet Take 81 mg by mouth daily.  . clopidogrel (PLAVIX) 75 MG tablet Take 1 tablet (75 mg total) by mouth daily with breakfast.  . simvastatin (ZOCOR) 10 MG tablet TAKE ONE-HALF TABLET BY  MOUTH AT BEDTIME  . tamsulosin (FLOMAX) 0.4 MG CAPS capsule Take 0.4 mg by mouth daily. Daily during the evening  . [DISCONTINUED] simvastatin (ZOCOR) 10 MG tablet Take 5 mg by mouth daily at 6 PM.   No facility-administered encounter medications on file as of 05/18/2017.     Activities of Daily Living In your present state of health, do you have any difficulty performing the following activities: 05/18/2017 12/09/2016  Hearing? Y Y  Comment - -  Vision? N N  Difficulty concentrating or making decisions? N Y  Comment - -  Walking or climbing stairs? N N  Dressing or bathing? N N  Doing errands, shopping? N Y  Conservation officer, nature and eating ? N -  Using the Toilet? N -  In the past six months, have you accidently leaked urine? Y -  Comment followed by UR  -  Do you have problems with loss of bowel control? N -  Managing your Medications? N -  Managing your Finances? N -  Housekeeping or managing your  Housekeeping? N -  Some recent data might be hidden    Patient Care Team: [name], MD as PCP - General (Family Medicine)   Assessment:  Exercise Activities and Dietary recommendations Current Exercise Habits: Home exercise routine, Intensity: Moderate (splits wood )  Goals    . patient          Maintain health        Fall Risk Fall Risk  05/18/2017 07/02/2014 06/16/2013  Falls in the past year? 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 Influenza Split 09/21/2011  . Influenza, High Dose Seasonal PF 06/26/2015, 06/17/2016  . Influenza,inj,Quad PF,6+ Mos 06/16/2013, 07/02/2014  . Pneumococcal Conjugate-13 04/28/2016  . Tdap 04/28/2016   Screening Tests Health Maintenance  Topic Date Due  . INFLUENZA VACCINE  03/31/2017  . PNA vac Low Risk Adult (2 of 2 - PPSV23) 04/28/2017  . TETANUS/TDAP  04/28/2026      Plan:     PCP Notes   Health Maintenance Declined flu vaccine today Discussed PSV 23 and did not want to take this    Abnormal Screens  none  Referrals  None  Patient concerns; None verbalized; very happy, enjoying his life   Nurse Concerns; none  Next PCP apt  TBS        I have personally reviewed and noted the following in the patient's chart:   . Medical  and social history . Use of alcohol, tobacco or illicit drugs  . Current medications and supplements . Functional ability and status . Nutritional status . Physical activity . Advanced directives . List of other physicians . Hospitalizations, surgeries, and ER visits in previous 12 months . Vitals . Screenings to include cognitive, depression, and falls . Referrals and appointments  In addition, I have reviewed and discussed with patient certain preventive protocols, quality metrics, and best practice recommendations. A written personalized care plan for preventive services as well as general preventive health recommendations were provided to patient.     DXAJO,INOMV, RN  05/18/2017  Agree with assessment as above.  We will continue to encourage him to get flu vaccine.  [name] MD [facility] at [facility]

## 2017-05-18 ENCOUNTER — Ambulatory Visit (INDEPENDENT_AMBULATORY_CARE_PROVIDER_SITE_OTHER): Payer: Medicare Other

## 2017-05-18 VITALS — BP 110/60 | HR 68 | Ht 69.0 in | Wt 162.0 lb

## 2017-05-18 DIAGNOSIS — Z0000 Encounter for general adult medical examination without abnormal findings: Secondary | ICD-10-CM

## 2017-05-18 NOTE — Patient Instructions (Addendum)
[name] , Thank you for taking time to come for your Medicare Wellness Visit. I appreciate your ongoing commitment to your health goals. Please review the following plan we discussed and let me know if I can assist you in the future.   Keep in mind the flu shot is an inactivated vaccine and takes at least 2 weeks to build immunity. The flu virus can be dormant for 4 days prior to symptoms Taking the flu shot at the beginning of the season can reduce the risk for the entire community.   Also, it looks like you may be due for a PSV 23 or your last pneumonia vaccination.   These are the goals we discussed: Goals    . patient          Maintain health         This is a list of the screening recommended for you and due dates:  Health Maintenance  Topic Date Due  . Flu Shot  03/31/2017  . Pneumonia vaccines (2 of 2 - PPSV23) 04/28/2017  . Tetanus Vaccine  04/28/2026   Prevention of falls: Remove rugs or any tripping hazards in the home Use Non slip mats in bathtubs and showers Placing grab bars next to the toilet and or shower Placing handrails on both sides of the stair way Adding extra lighting in the home.   Personal safety issues reviewed:  1. Consider starting a community watch program per [facility] 2.  Changes batteries is smoke detector and/or carbon monoxide detector  3.  If you have firearms; keep them in a safe place 4.  Wear protection when in the sun; Always wear sunscreen or a hat; It is good to have your doctor check your skin annually or review any new areas of concern 5. Driving safety; Keep in the right lane; stay 3 car lengths behind the car in front of you on the highway; look 3 times prior to pulling out; carry your cell phone everywhere you go!    Learn about the Yellow Dot program:  The program allows first responders at your emergency to have access to who your physician is, as well as your medications and medical conditions.  Citizens  requesting the Yellow Dot Packages should contact Master Corporal Nunzio Cobbs at the [facility] [phone] for the first week of the program and beginning the week after Easter citizens should contact their Scientist, physiological.        Fall Prevention in the Home Falls can cause injuries and can affect people from all age groups. There are many simple things that you can do to make your home safe and to help prevent falls. What can I do on the outside of my home?  Regularly repair the edges of walkways and driveways and fix any cracks.  Remove high doorway thresholds.  Trim any shrubbery on the main path into your home.  Use bright outdoor lighting.  Clear walkways of debris and clutter, including tools and rocks.  Regularly check that handrails are securely fastened and in good repair. Both sides of any steps should have handrails.  Install guardrails along the edges of any raised decks or porches.  Have leaves, snow, and ice cleared regularly.  Use sand or salt on walkways during winter months.  In the garage, clean up any spills right away, including grease or oil spills. What can I do in the bathroom?  Use night lights.  Install grab bars by  the toilet and in the tub and shower. Do not use towel bars as grab bars.  Use non-skid mats or decals on the floor of the tub or shower.  If you need to sit down while you are in the shower, use a plastic, non-slip stool.  Keep the floor dry. Immediately clean up any water that spills on the floor.  Remove soap buildup in the tub or shower on a regular basis.  Attach bath mats securely with double-sided non-slip rug tape.  Remove throw rugs and other tripping hazards from the floor. What can I do in the bedroom?  Use night lights.  Make sure that a bedside light is easy to reach.  Do not use oversized bedding that drapes onto the floor.  Have a firm chair that has side arms to use for  getting dressed.  Remove throw rugs and other tripping hazards from the floor. What can I do in the kitchen?  Clean up any spills right away.  Avoid walking on wet floors.  Place frequently used items in easy-to-reach places.  If you need to reach for something above you, use a sturdy step stool that has a grab bar.  Keep electrical cables out of the way.  Do not use floor polish or wax that makes floors slippery. If you have to use wax, make sure that it is non-skid floor wax.  Remove throw rugs and other tripping hazards from the floor. What can I do in the stairways?  Do not leave any items on the stairs.  Make sure that there are handrails on both sides of the stairs. Fix handrails that are broken or loose. Make sure that handrails are as long as the stairways.  Check any carpeting to make sure that it is firmly attached to the stairs. Fix any carpet that is loose or worn.  Avoid having throw rugs at the top or bottom of stairways, or secure the rugs with carpet tape to prevent them from moving.  Make sure that you have a light switch at the top of the stairs and the bottom of the stairs. If you do not have them, have them installed. What are some other fall prevention tips?  Wear closed-toe shoes that fit well and support your feet. Wear shoes that have rubber soles or low heels.  When you use a stepladder, make sure that it is completely opened and that the sides are firmly locked. Have someone hold the ladder while you are using it. Do not climb a closed stepladder.  Add color or contrast paint or tape to grab bars and handrails in your home. Place contrasting color strips on the first and last steps.  Use mobility aids as needed, such as canes, walkers, scooters, and crutches.  Turn on lights if it is dark. Replace any light bulbs that burn out.  Set up furniture so that there are clear paths. Keep the furniture in the same spot.  Fix any uneven floor  surfaces.  Choose a carpet design that does not hide the edge of steps of a stairway.  Be aware of any and all pets.  Review your medicines with your healthcare provider. Some medicines can cause dizziness or changes in blood pressure, which increase your risk of falling. Talk with your health care provider about other ways that you can decrease your risk of falls. This may include working with a physical therapist or trainer to improve your strength, balance, and endurance. This information is not intended  to replace advice given to you by your health care provider. Make sure you discuss any questions you have with your health care provider. Document Released: 08/07/2002 Document Revised: 01/14/2016 Document Reviewed: 09/21/2014 Elsevier Interactive Patient Education  2017 [place] Maintenance, Male A healthy lifestyle and preventive care is important for your health and wellness. Ask your health care provider about what schedule of regular examinations is right for you. What should I know about weight and diet? Eat a Healthy Diet  Eat plenty of vegetables, fruits, whole grains, low-fat dairy products, and lean protein.  Do not eat a lot of foods high in solid fats, added sugars, or salt.  Maintain a Healthy Weight Regular exercise can help you achieve or maintain a healthy weight. You should:  Do at least 150 minutes of exercise each week. The exercise should increase your heart rate and make you sweat (moderate-intensity exercise).  Do strength-training exercises at least twice a week.  Watch Your Levels of Cholesterol and Blood Lipids  Have your blood tested for lipids and cholesterol every 5 years starting at 81 years of age. If you are at high risk for heart disease, you should start having your blood tested when you are 81 years old. You may need to have your cholesterol levels checked more often if: ? Your lipid or cholesterol levels are high. ? You are older  than 81 years of age. ? You are at high risk for heart disease.  What should I know about cancer screening? Many types of cancers can be detected early and may often be prevented. Lung Cancer  You should be screened every year for lung cancer if: ? You are a current smoker who has smoked for at least 30 years. ? You are a former smoker who has quit within the past 15 years.  Talk to your health care provider about your screening options, when you should start screening, and how often you should be screened.  Colorectal Cancer  Routine colorectal cancer screening usually begins at 81 years of age and should be repeated every 5-10 years until you are 81 years old. You may need to be screened more often if early forms of precancerous polyps or small growths are found. Your health care provider may recommend screening at an earlier age if you have risk factors for colon cancer.  Your health care provider may recommend using home test kits to check for hidden blood in the stool.  A small camera at the end of a tube can be used to examine your colon (sigmoidoscopy or colonoscopy). This checks for the earliest forms of colorectal cancer.  Prostate and Testicular Cancer  Depending on your age and overall health, your health care provider may do certain tests to screen for prostate and testicular cancer.  Talk to your health care provider about any symptoms or concerns you have about testicular or prostate cancer.  Skin Cancer  Check your skin from head to toe regularly.  Tell your health care provider about any new moles or changes in moles, especially if: ? There is a change in a mole's size, shape, or color. ? You have a mole that is larger than a pencil eraser.  Always use sunscreen. Apply sunscreen liberally and repeat throughout the day.  Protect yourself by wearing long sleeves, pants, a wide-brimmed hat, and sunglasses when outside.  What should I know about heart disease,  diabetes, and high blood pressure?  If you are 18-39 years  of age, have your blood pressure checked every 3-5 years. If you are 80 years of age or older, have your blood pressure checked every year. You should have your blood pressure measured twice-once when you are at a hospital or clinic, and once when you are not at a hospital or clinic. Record the average of the two measurements. To check your blood pressure when you are not at a hospital or clinic, you can use: ? An automated blood pressure machine at a pharmacy. ? A home blood pressure monitor.  Talk to your health care provider about your target blood pressure.  If you are between 45-65 years old, ask your health care provider if you should take aspirin to prevent heart disease.  Have regular diabetes screenings by checking your fasting blood sugar level. ? If you are at a normal weight and have a low risk for diabetes, have this test once every three years after the age of 85. ? If you are overweight and have a high risk for diabetes, consider being tested at a younger age or more often.  A one-time screening for abdominal aortic aneurysm (AAA) by ultrasound is recommended for men aged 64-75 years who are current or former smokers. What should I know about preventing infection? Hepatitis B If you have a higher risk for hepatitis B, you should be screened for this virus. Talk with your health care provider to find out if you are at risk for hepatitis B infection. Hepatitis C Blood testing is recommended for:  Everyone born from [birth year] through 1963.  Anyone with known risk factors for hepatitis C.  Sexually Transmitted Diseases (STDs)  You should be screened each year for STDs including gonorrhea and chlamydia if: ? You are sexually active and are younger than 81 years of age. ? You are older than 81 years of age and your health care provider tells you that you are at risk for this type of infection. ? Your sexual activity has  changed since you were last screened and you are at an increased risk for chlamydia or gonorrhea. Ask your health care provider if you are at risk.  Talk with your health care provider about whether you are at high risk of being infected with HIV. Your health care provider may recommend a prescription medicine to help prevent HIV infection.  What else can I do?  Schedule regular health, dental, and eye exams.  Stay current with your vaccines (immunizations).  Do not use any tobacco products, such as cigarettes, chewing tobacco, and e-cigarettes. If you need help quitting, ask your health care provider.  Limit alcohol intake to no more than 2 drinks per day. One drink equals 12 ounces of beer, 5 ounces of wine, or 1 ounces o.f hard liquor.  Do not use street drugs.  Do not share needles.  Ask your health care provider for help if you need support or information about quitting drugs.  Tell your health care provider if you often feel depressed.  Tell your health care provider if you have ever been abused or do not feel safe at home. This information is not intended to replace advice given to you by your health care provider. Make sure you discuss any questions you have with your health care provider. Document Released: 02/13/2008 Document Revised: 04/15/2016 Document Reviewed: 05/21/2015 Elsevier Interactive Patient Education  [employer].

## 2017-05-20 ENCOUNTER — Encounter: Payer: Self-pay | Admitting: Family Medicine

## 2017-05-27 ENCOUNTER — Ambulatory Visit: Payer: Medicare Other | Admitting: Cardiology

## 2017-06-03 DIAGNOSIS — I447 Left bundle-branch block, unspecified: Secondary | ICD-10-CM | POA: Diagnosis not present

## 2017-06-03 DIAGNOSIS — I350 Nonrheumatic aortic (valve) stenosis: Secondary | ICD-10-CM | POA: Diagnosis not present

## 2017-06-03 DIAGNOSIS — E7800 Pure hypercholesterolemia, unspecified: Secondary | ICD-10-CM | POA: Diagnosis not present

## 2017-06-03 LAB — BASIC METABOLIC PANEL
BUN / CREAT RATIO: 16 (ref 10–24)
BUN: 18 mg/dL (ref 8–27)
CHLORIDE: 104 mmol/L (ref 96–106)
CO2: 20 mmol/L (ref 20–29)
Calcium: 9.3 mg/dL (ref 8.6–10.2)
Creatinine, Ser: 1.12 mg/dL (ref 0.76–1.27)
GFR, EST AFRICAN AMERICAN: 68 mL/min/{1.73_m2} (ref >59)
GFR, EST NON AFRICAN AMERICAN: 59 mL/min/{1.73_m2} — AB (ref >59)
Glucose: 88 mg/dL (ref 65–99)
POTASSIUM: 4.5 mmol/L (ref 3.5–5.2)
SODIUM: 141 mmol/L (ref 134–144)

## 2017-06-03 LAB — HEPATIC FUNCTION PANEL
ALBUMIN: 4.3 g/dL (ref 3.5–4.7)
ALK PHOS: 93 IU/L (ref 39–117)
ALT: 10 IU/L (ref 0–44)
AST: 21 IU/L (ref 0–40)
Bilirubin Total: 0.5 mg/dL (ref 0.0–1.2)
Bilirubin, Direct: 0.19 mg/dL (ref 0.00–0.40)
TOTAL PROTEIN: 6.5 g/dL (ref 6.0–8.5)

## 2017-06-03 LAB — LIPID PANEL
CHOL/HDL RATIO: 2.7 ratio (ref 0.0–5.0)
CHOLESTEROL TOTAL: 156 mg/dL (ref 100–199)
HDL: 58 mg/dL (ref >39)
LDL CALC: 85 mg/dL (ref 0–99)
TRIGLYCERIDES: 66 mg/dL (ref 0–149)
VLDL CHOLESTEROL CAL: 13 mg/dL (ref 5–40)

## 2017-06-03 NOTE — Progress Notes (Signed)
Cardiology Office Note   Date:  06/07/2017   ID:  Jonathan Graham, DOB Jonathan Graham, MRN [id number]  PCP:  Jonathan Post, MD  Cardiologist: Jonathan Decock [name] MD  Chief Complaint  Patient presents with  . Follow-up  . Congestive Heart Failure      History of Present Illness: Jonathan Graham is a 83 y.o. male who presents for follow up Aortic stenosis, CHF, LBBB, and HLD.    He has a history of known left bundle branch block and a history of hypercholesterolemia.  He has a history of prostate CA treated with radiation therapy. The patient has a history of  aortic stenosis.  When seen earlier this year Echo demonstrated at least moderate AS. EF had dropped from 40-45% to 25-30%. As such a Dobutamine Echo and suggested he had low gradient severe AS. He subsequently underwent TAVR with Dr. Angelena Graham on 12/08/16 with # 10 Jonathan Graham valve. He did well with this. Recommended DAPT for 6 months. Repeat Echo 01/08/17 showed normal function of valve but persistently low EF 30-35%. He was seen in June with an episode of orthostatic hypotension. Has not been on any BP meds. Compression hose ordered but he was not able to tolerate since he said they aggravated his large varicosities.   His wife states he gets out of breath sometimes making the bed. He states he is doing well and has been chopping wood. He denies any lightheadedness since June. No palpitations. He does have a history of periodic hyperthermia but states he hasn't had this since his valve procedure.    Past Medical History:  Diagnosis Date  . Arthritis   . Atypical chest pain    history of   . Back pain   . Cancer [facility]) 2014   prostate  . Cardiovascular stress test abnormal 04/25/2004   Abnormal adenosine Cardiolite study / evidence of inferoseptal ischemia &  an EF of 48% ""recommended that he undergo cardiac catheterization""  . Cervical spine disease   . Dizziness    occasionally apon bending over  . Fatigue    history of   . Heart murmur   . History of hiatal hernia   . History of mononucleosis   . Hypercholesterolemia   . Left bundle branch block   . Pleurisy 07/2005  . S/P radiation therapy  12/05/2012 through 01/30/2013                                                         Prostate 7800 cGy 40 sessions, seminal vesicles 5600 cGy 40 sessions                       . Shortness of breath   . Spondylosis, cervical    at C3-C4  . Varicose veins   . Weakness    history of     Past Surgical History:  Procedure Laterality Date  . BACK SURGERY  1995    2 ruptured discs in lower back / by Dr. Louanne Graham  . CARDIAC CATHETERIZATION  2005   EF estimated at 50% /  PROCEDURE:  Left heart catheterization, coronary and left ventricular angiography /  RAO view demonstrates mild LV enlargement  / mild hypokinesia anteroseptal with  overall low normal LV systolic function / Normal coronary  anatomy  . HEMORRHOID SURGERY    . PROSTATE BIOPSY  08/25/2012   Gleason 3+3=6 PSA 7.80  . RIGHT/LEFT HEART CATH AND CORONARY ANGIOGRAPHY N/A 11/13/2016   Procedure: Right/Left Heart Cath and Coronary Angiography;  Surgeon: Jonathan Hershey M [name], MD;  Location: [place] [facility];  Service: Cardiovascular;  Laterality: N/A;  . TEE WITHOUT CARDIOVERSION N/A 12/08/2016   Procedure: TRANSESOPHAGEAL ECHOCARDIOGRAM (TEE);  Surgeon: Jonathan Blanks, MD;  Location: [place];  Service: Open Heart Surgery;  Laterality: N/A;  . TONSILLECTOMY AND ADENOIDECTOMY    . TRANSCATHETER AORTIC VALVE REPLACEMENT, TRANSFEMORAL N/A 12/08/2016   Procedure: TRANSCATHETER AORTIC VALVE REPLACEMENT, TRANSFEMORAL;  Surgeon: Jonathan Blanks, MD;  Location: [place];  Service: Open Heart Surgery;  Laterality: N/A;     Current Outpatient Prescriptions  Medication Sig Dispense Refill  . amoxicillin (AMOXIL) 500 MG tablet Take 4 tablets by mouth 30-60 minutes prior to dental procedure 4 tablet 4  . aspirin EC 81 MG tablet Take 81 mg by mouth daily.      . simvastatin (ZOCOR) 10 MG tablet TAKE ONE-HALF TABLET BY  MOUTH AT BEDTIME 45 tablet 1  . tamsulosin (FLOMAX) 0.4 MG CAPS capsule Take 0.4 mg by mouth daily. Daily during the evening    . lisinopril (PRINIVIL,ZESTRIL) 2.5 MG tablet Take 0.5 tablets (1.25 mg total) by mouth daily. 90 tablet 3   No current facility-administered medications for this visit.     Allergies:   Lipitor [atorvastatin calcium]    Social History:  The patient  reports that he quit smoking about 65 years ago. His smoking use included Cigarettes. He started smoking about 67 years ago. He has a 2.00 pack-year smoking history. He has never used smokeless tobacco. He reports that he does not drink alcohol or use drugs.   Family History:  The patient's family history includes Cancer in his father; Heart disease in his mother; Heart failure in his mother; Lung cancer in his brother; Multiple sclerosis in his sister; Stroke in his father.    ROS:  Please see the history of present illness. Otherwise, review of systems are positive for none.   All other systems are reviewed and negative.    PHYSICAL EXAM: VS:  BP 123/69   Pulse 69   Ht 5\' 9"  (1.753 m)   Wt 163 lb 12.8 oz (74.3 kg)   BMI 24.19 kg/m  , BMI Body mass index is 24.19 kg/m. GENERAL:  Well appearing, elderly WM in NAD HEENT:  PERRL, EOMI, sclera are clear. Oropharynx is clear. NECK:  No jugular venous distention, carotid upstroke brisk and symmetric, no bruits, no thyromegaly or adenopathy LUNGS:  Clear to auscultation bilaterally CHEST:  Unremarkable HEART:  RRR,  PMI not displaced or sustained,S1 and S2 within normal limits, no S3, no S4: no clicks, no rubs, soft 1/6 systolic murmur RUSB. ABD:  Soft, nontender. BS +, no masses or bruits. No hepatomegaly, no splenomegaly EXT:  2 + pulses throughout, no edema, no cyanosis no clubbing SKIN:  Warm and dry.  No rashes NEURO:  Alert and oriented x 3. Cranial nerves II through XII intact. PSYCH:  Cognitively  intact  EKG:  EKG is not ordered today.   Recent Labs: 12/09/2016: Magnesium 1.9 02/09/2017: Hemoglobin 12.1; Platelets 209.0 06/03/2017: ALT 10; BUN 18; Creatinine, Ser 1.12; Potassium 4.5; Sodium 141    Lipid Panel    Component Value Date/Time   CHOL 156 06/03/2017 0832   TRIG 66 06/03/2017 0832   HDL  58 06/03/2017 0832   CHOLHDL 2.7 06/03/2017 0832   CHOLHDL 2.7 09/24/2016 0803   VLDL 11 09/24/2016 0803   LDLCALC 85 06/03/2017 0832      Wt Readings from Last 3 Encounters:  06/07/17 163 lb 12.8 oz (74.3 kg)  05/18/17 162 lb (73.5 kg)  03/01/17 161 lb 6.4 oz (73.2 kg)     Cardiac cath March 2018: Procedures   Right/Left Heart Cath and Coronary Angiography  Conclusion     Ost 1st Mrg to 1st Mrg lesion, 50 %stenosed.  There is moderate to severe left ventricular systolic dysfunction.  LV end diastolic pressure is normal.  The left ventricular ejection fraction is 25-35% by visual estimate.  LV end diastolic pressure is normal.  There is moderate aortic valve stenosis.   1. Mild nonobstructive CAD 2. Normal right heart pressures 3. Normal LV filling pressures  4. Moderate Aortic stenosis with mean gradient of 26 mm Hg. AV area may be underestimated due to low EF.   Plan: consider AVR/TAVR.     Echo 01/08/17: Study Conclusions  - Left ventricle: The cavity size was normal. Wall thickness was   normal. Systolic function was moderately to severely reduced. The   estimated ejection fraction was in the range of 30% to 35%. Wall   motion was normal; there were no regional wall motion   abnormalities. Doppler parameters are consistent with abnormal   left ventricular relaxation (grade 1 diastolic dysfunction). - Ventricular septum: Septal motion showed abnormal function and   dyssynergy. - Aortic valve: A TAVR bioprosthesis was present and functioning   normally. Mean gradient (S): 12 mm Hg. Peak gradient (S): 26 mm   Hg. - Mitral valve: Calcified  annulus. - Left atrium: The atrium was mildly dilated. - Pulmonary arteries: Systolic pressure was moderately increased.   PA peak pressure: 50 mm Hg (S).  Impressions:  - Compared to the prior study, there has been no significant   interval change.   ASSESSMENT AND PLAN:  1. Severe  aortic stenosis- s/p TAVR. He has made an excellent recovery. Will stop Plavix this week. Continue ASA indefinitely. 2. Hypercholesterolemia- fair control. On Zocor.  3. Prostate cancer treated with radiation therapy 4. LBBB chronic.  5. Nonischemic cardiomyopathy with EF 30-35%. Currently on no CHF therapy. Patient does have a history of orthostatic hypotension. BP looks fine now. Would like to start ACEi but will need to start low and make sure he can tolerate. Start 1.25 mg lisinopril daily. Check BMET in one month.Follow up in 3 months. 6. History of orthostatic hypotension.   Current medicines are reviewed at length with the patient today.  The patient does not have concerns regarding medicines.  The following changes have been made: none  Labs/ tests ordered today include:   Orders Placed This Encounter  Procedures  . Basic metabolic panel     Signed, Jonathan Landi [name] MD, [facility]   06/07/2017 4:59 PM    [facility]

## 2017-06-07 ENCOUNTER — Encounter: Payer: Self-pay | Admitting: Cardiology

## 2017-06-07 ENCOUNTER — Ambulatory Visit (INDEPENDENT_AMBULATORY_CARE_PROVIDER_SITE_OTHER): Payer: Medicare Other | Admitting: Cardiology

## 2017-06-07 VITALS — BP 123/69 | HR 69 | Ht 69.0 in | Wt 163.8 lb

## 2017-06-07 DIAGNOSIS — I5022 Chronic systolic (congestive) heart failure: Secondary | ICD-10-CM | POA: Diagnosis not present

## 2017-06-07 DIAGNOSIS — I350 Nonrheumatic aortic (valve) stenosis: Secondary | ICD-10-CM | POA: Diagnosis not present

## 2017-06-07 DIAGNOSIS — I447 Left bundle-branch block, unspecified: Secondary | ICD-10-CM | POA: Diagnosis not present

## 2017-06-07 DIAGNOSIS — I951 Orthostatic hypotension: Secondary | ICD-10-CM | POA: Diagnosis not present

## 2017-06-07 DIAGNOSIS — Z952 Presence of prosthetic heart valve: Secondary | ICD-10-CM | POA: Diagnosis not present

## 2017-06-07 MED ORDER — LISINOPRIL 2.5 MG PO TABS: 1.2500 mg | ORAL_TABLET | Freq: Every day | ORAL | 3 refills | Status: DC

## 2017-06-07 NOTE — Patient Instructions (Addendum)
You may stop Plavix in 2 days  Start lisinopril 1.25 mg daily for your weak heart.  We will check blood work in one month  I will see you in 3 months.

## 2017-06-17 ENCOUNTER — Ambulatory Visit (INDEPENDENT_AMBULATORY_CARE_PROVIDER_SITE_OTHER): Payer: Medicare Other | Admitting: *Deleted

## 2017-06-17 DIAGNOSIS — Z23 Encounter for immunization: Secondary | ICD-10-CM | POA: Diagnosis not present

## 2017-07-08 DIAGNOSIS — I951 Orthostatic hypotension: Secondary | ICD-10-CM | POA: Diagnosis not present

## 2017-07-08 DIAGNOSIS — Z952 Presence of prosthetic heart valve: Secondary | ICD-10-CM | POA: Diagnosis not present

## 2017-07-08 DIAGNOSIS — I447 Left bundle-branch block, unspecified: Secondary | ICD-10-CM | POA: Diagnosis not present

## 2017-07-08 DIAGNOSIS — I350 Nonrheumatic aortic (valve) stenosis: Secondary | ICD-10-CM | POA: Diagnosis not present

## 2017-07-08 DIAGNOSIS — I5022 Chronic systolic (congestive) heart failure: Secondary | ICD-10-CM | POA: Diagnosis not present

## 2017-07-08 LAB — BASIC METABOLIC PANEL
BUN / CREAT RATIO: 21 (ref 10–24)
BUN: 22 mg/dL (ref 8–27)
CHLORIDE: 103 mmol/L (ref 96–106)
CO2: 24 mmol/L (ref 20–29)
Calcium: 8.9 mg/dL (ref 8.6–10.2)
Creatinine, Ser: 1.07 mg/dL (ref 0.76–1.27)
GFR calc non Af Amer: 62 mL/min/{1.73_m2} (ref >59)
GFR, EST AFRICAN AMERICAN: 72 mL/min/{1.73_m2} (ref >59)
Glucose: 80 mg/dL (ref 65–99)
POTASSIUM: 4.8 mmol/L (ref 3.5–5.2)
SODIUM: 140 mmol/L (ref 134–144)

## 2017-08-25 DIAGNOSIS — R5383 Other fatigue: Secondary | ICD-10-CM | POA: Insufficient documentation

## 2017-08-25 DIAGNOSIS — M47812 Spondylosis without myelopathy or radiculopathy, cervical region: Secondary | ICD-10-CM | POA: Insufficient documentation

## 2017-08-25 DIAGNOSIS — R0789 Other chest pain: Secondary | ICD-10-CM | POA: Insufficient documentation

## 2017-08-25 DIAGNOSIS — M1990 Unspecified osteoarthritis, unspecified site: Secondary | ICD-10-CM | POA: Insufficient documentation

## 2017-08-25 DIAGNOSIS — Z923 Personal history of irradiation: Secondary | ICD-10-CM | POA: Insufficient documentation

## 2017-08-25 DIAGNOSIS — Z8719 Personal history of other diseases of the digestive system: Secondary | ICD-10-CM | POA: Insufficient documentation

## 2017-08-25 DIAGNOSIS — M549 Dorsalgia, unspecified: Secondary | ICD-10-CM | POA: Insufficient documentation

## 2017-08-25 DIAGNOSIS — Z8619 Personal history of other infectious and parasitic diseases: Secondary | ICD-10-CM | POA: Insufficient documentation

## 2017-08-25 DIAGNOSIS — M489 Spondylopathy, unspecified: Secondary | ICD-10-CM | POA: Insufficient documentation

## 2017-08-25 DIAGNOSIS — R0602 Shortness of breath: Secondary | ICD-10-CM | POA: Insufficient documentation

## 2017-08-25 DIAGNOSIS — R011 Cardiac murmur, unspecified: Secondary | ICD-10-CM | POA: Insufficient documentation

## 2017-08-25 DIAGNOSIS — R531 Weakness: Secondary | ICD-10-CM | POA: Insufficient documentation

## 2017-09-04 ENCOUNTER — Other Ambulatory Visit: Payer: Self-pay | Admitting: Cardiology

## 2017-09-06 ENCOUNTER — Encounter: Payer: Self-pay | Admitting: Family Medicine

## 2017-09-06 ENCOUNTER — Ambulatory Visit (INDEPENDENT_AMBULATORY_CARE_PROVIDER_SITE_OTHER)
Admission: RE | Admit: 2017-09-06 | Discharge: 2017-09-06 | Disposition: A | Discharge status: Home / Self Care | Payer: Medicare Other | Source: Ambulatory Visit | Attending: Family Medicine | Admitting: Family Medicine

## 2017-09-06 ENCOUNTER — Ambulatory Visit (INDEPENDENT_AMBULATORY_CARE_PROVIDER_SITE_OTHER): Payer: Medicare Other | Admitting: Family Medicine

## 2017-09-06 VITALS — BP 118/68 | HR 64 | Temp 97.7°F | Resp 16 | Ht 69.0 in | Wt 161.4 lb

## 2017-09-06 DIAGNOSIS — M545 Low back pain, unspecified: Secondary | ICD-10-CM

## 2017-09-06 DIAGNOSIS — T464X5A Adverse effect of angiotensin-converting-enzyme inhibitors, initial encounter: Secondary | ICD-10-CM

## 2017-09-06 DIAGNOSIS — R05 Cough: Secondary | ICD-10-CM | POA: Diagnosis not present

## 2017-09-06 DIAGNOSIS — W19XXXA Unspecified fall, initial encounter: Secondary | ICD-10-CM

## 2017-09-06 DIAGNOSIS — R058 Other specified cough: Secondary | ICD-10-CM

## 2017-09-06 NOTE — Patient Instructions (Addendum)
Jonathan Graham I have seen you today for an acute visit.  A few things to remember from today's visit:   Acute bilateral low back pain without sciatica - Plan: DG Lumbar Spine Complete  Fall, accidental, initial encounter - Plan: DG Lumbar Spine Complete  Cough due to ACE inhibitor   Tylenol 500 mg 3 times per day of needed. Icy Hot patch may also help.  Today X ray was ordered.  This can be done at [facility] between 8 am and 5 pm: [place]. [phone].    Fall Prevention in the Home Falls can cause injuries. They can happen to people of all ages. There are many things you can do to make your home safe and to help prevent falls. What can I do on the outside of my home?  Regularly fix the edges of walkways and driveways and fix any cracks.  Remove anything that might make you trip as you walk through a door, such as a raised step or threshold.  Trim any bushes or trees on the path to your home.  Use bright outdoor lighting.  Clear any walking paths of anything that might make someone trip, such as rocks or tools.  Regularly check to see if handrails are loose or broken. Make sure that both sides of any steps have handrails.  Any raised decks and porches should have guardrails on the edges.  Have any leaves, snow, or ice cleared regularly.  Use sand or salt on walking paths during winter.  Clean up any spills in your garage right away. This includes oil or grease spills. What can I do in the bathroom?  Use night lights.  Install grab bars by the toilet and in the tub and shower. Do not use towel bars as grab bars.  Use non-skid mats or decals in the tub or shower.  If you need to sit down in the shower, use a plastic, non-slip stool.  Keep the floor dry. Clean up any water that spills on the floor as soon as it happens.  Remove soap buildup in the tub or shower regularly.  Attach bath mats securely with  double-sided non-slip rug tape.  Do not have throw rugs and other things on the floor that can make you trip. What can I do in the bedroom?  Use night lights.  Make sure that you have a light by your bed that is easy to reach.  Do not use any sheets or blankets that are too big for your bed. They should not hang down onto the floor.  Have a firm chair that has side arms. You can use this for support while you get dressed.  Do not have throw rugs and other things on the floor that can make you trip. What can I do in the kitchen?  Clean up any spills right away.  Avoid walking on wet floors.  Keep items that you use a lot in easy-to-reach places.  If you need to reach something above you, use a strong step stool that has a grab bar.  Keep electrical cords out of the way.  Do not use floor polish or wax that makes floors slippery. If you must use wax, use non-skid floor wax.  Do not have throw rugs and other things on the floor that can make you trip. What can I do with my stairs?  Do not leave any items on the stairs.  Make sure that there  are handrails on both sides of the stairs and use them. Fix handrails that are broken or loose. Make sure that handrails are as long as the stairways.  Check any carpeting to make sure that it is firmly attached to the stairs. Fix any carpet that is loose or worn.  Avoid having throw rugs at the top or bottom of the stairs. If you do have throw rugs, attach them to the floor with carpet tape.  Make sure that you have a light switch at the top of the stairs and the bottom of the stairs. If you do not have them, ask someone to add them for you. What else can I do to help prevent falls?  Wear shoes that: ? Do not have high heels. ? Have rubber bottoms. ? Are comfortable and fit you well. ? Are closed at the toe. Do not wear sandals.  If you use a stepladder: ? Make sure that it is fully opened. Do not climb a closed stepladder. ? Make  sure that both sides of the stepladder are locked into place. ? Ask someone to hold it for you, if possible.  Clearly mark and make sure that you can see: ? Any grab bars or handrails. ? First and last steps. ? Where the edge of each step is.  Use tools that help you move around (mobility aids) if they are needed. These include: ? Canes. ? Walkers. ? Scooters. ? Crutches.  Turn on the lights when you go into a dark area. Replace any light bulbs as soon as they burn out.  Set up your furniture so you have a clear path. Avoid moving your furniture around.  If any of your floors are uneven, fix them.  If there are any pets around you, be aware of where they are.  Review your medicines with your doctor. Some medicines can make you feel dizzy. This can increase your chance of falling. Ask your doctor what other things that you can do to help prevent falls. This information is not intended to replace advice given to you by your health care provider. Make sure you discuss any questions you have with your health care provider. Document Released: 06/13/2009 Document Revised: 01/23/2016 Document Reviewed: 09/21/2014 Elsevier Interactive Patient Education  [employer].   In general please monitor for signs of worsening symptoms and seek immediate medical attention if any concerning.  If symptoms are not resolved in a few days/weeks you should schedule a follow up appointment with your doctor, before if needed.  I hope you get better soon!

## 2017-09-06 NOTE — Progress Notes (Addendum)
ACUTE VISIT   HPI:  Chief Complaint  Patient presents with  . Fall    Jonathan Graham is a 84 y.o. male, who is here today with his wife complaining of lower back pain that is started at the after a fall at home.  On 09/03/17 around 3 pm she slip on wooden floor while walking outdoors.  He landed on his back, hit his head,right forearm, and right hip.  He denies LOC, he was able to get up and walk into the house.  He started with right arm pain and lower back pain next day in the morning and night respectively.  Lower back pain was severe,achy/sharp, 10/10, no radiated, no lower extremity numbness or tingling and no saddle anesthesia. He has history of lumbar spine surgery. Lower back pain is worse when he is laying down in bed, it was keeping him from sleep. Alleviated by getting up and walking around.   Pain has improved. He took Aspirin 2 days ago and Aleve last night. He denies headache, visual changes, MS changes, focal deficit, fever, or chills.  He also mentions nonproductive cough, intermittently, he feels like a "tickle" in his throat.  Problem started after he was started on Lisinopril. He denies associated chest pain, dyspnea, wheezing, or diaphoresis. History of CHF, he has appointment with his cardiologist, Dr.  Peter [name], this coming Monday (09/13/17).  He has not noted orthopnea, PND, or edema.   Review of Systems  Constitutional: Negative for appetite change, chills, fatigue and fever.  Eyes: Negative for photophobia and visual disturbance.  Respiratory: Positive for cough. Negative for shortness of breath.   Cardiovascular: Negative for chest pain and leg swelling.  Gastrointestinal: Negative for abdominal pain, nausea and vomiting.       No changes in bowel habits.  Genitourinary: Negative for decreased urine volume and hematuria.  Musculoskeletal: Positive for back pain. Negative for gait problem and neck pain.  Skin: Negative for wound.    Neurological: Negative for syncope, weakness, numbness and headaches.  Psychiatric/Behavioral: Negative for confusion. The patient is not nervous/anxious.       Current Outpatient Medications on File Prior to Visit  Medication Sig Dispense Refill  . amoxicillin (AMOXIL) 500 MG tablet Take 4 tablets by mouth 30-60 minutes prior to dental procedure 4 tablet 4  . aspirin EC 81 MG tablet Take 81 mg by mouth daily.    [name] lisinopril (PRINIVIL,ZESTRIL) 2.5 MG tablet Take 0.5 tablets (1.25 mg total) by mouth daily. 90 tablet 3  . simvastatin (ZOCOR) 10 MG tablet TAKE ONE-HALF TABLET BY  MOUTH AT BEDTIME 45 tablet 1  . tamsulosin (FLOMAX) 0.4 MG CAPS capsule Take 0.4 mg by mouth daily. Daily during the evening     No current facility-administered medications on file prior to visit.      Past Medical History:  Diagnosis Date  . Arthritis   . Atypical chest pain    history of   . Back pain   . Cancer [facility]) 2014   prostate  . Cardiovascular stress test abnormal 04/25/2004   Abnormal adenosine Cardiolite study / evidence of inferoseptal ischemia &  an EF of 48% ""recommended that he undergo cardiac catheterization""  . Cervical spine disease   . Dizziness    occasionally apon bending over  . Fatigue    history of   . Heart murmur   . History of hiatal hernia   . History of mononucleosis   . Hypercholesterolemia   .  Left bundle branch block   . Pleurisy 07/2005  . S/P radiation therapy  12/05/2012 through 01/30/2013                                                         Prostate 7800 cGy 40 sessions, seminal vesicles 5600 cGy 40 sessions                       . Shortness of breath   . Spondylosis, cervical    at C3-C4  . Varicose veins   . Weakness    history of    Allergies  Allergen Reactions  . Lipitor [Atorvastatin Calcium] Other (See Comments)    MYALGIAS CRAMPS    Social History   Socioeconomic History  . Marital status: Married    Spouse name: None  . Number of  children: 2  . Years of education: None  . Highest education level: None  Social Needs  . Financial resource strain: None  . Food insecurity - worry: None  . Food insecurity - inability: None  . Transportation needs - medical: None  . Transportation needs - non-medical: None  Occupational History  . Occupation: Insurance    Comment: Financial planner  Tobacco Use  . Smoking status: Former Smoker    Packs/day: 1.00    Years: 2.00    Pack years: 2.00    Types: Cigarettes    Start date: 08/31/1949    Last attempt to quit: 09/01/1951    Years since quitting: 66.0  . Smokeless tobacco: Never Used  Substance and Sexual Activity  . Alcohol use: No  . Drug use: No  . Sexual activity: None  Other Topics Concern  . None  Social History Narrative  . None    Vitals:   09/06/17 1454  BP: 118/68  Pulse: 64  Resp: 16  Temp: 97.7 F (36.5 C)  SpO2: 98%   Body mass index is 23.83 kg/m.   Physical Exam  Nursing note and vitals reviewed. Constitutional: He is oriented to person, place, and time. He appears well-developed and well-nourished. No distress.  HENT:  Head: Normocephalic and atraumatic. Head is without abrasion and without laceration.  Mouth/Throat: Mucous membranes are normal.  No deformity appreciated on occipital scalp and no tenderness.  Eyes: Conjunctivae are normal.  Cardiovascular: Normal rate and regular rhythm.  Murmur (SEM I/VI RUSB) heard. Pulses:      Dorsalis pedis pulses are 2+ on the right side, and 2+ on the left side.  Respiratory: Effort normal and breath sounds normal. No respiratory distress.  Musculoskeletal: He exhibits no edema or tenderness.       Right wrist: He exhibits normal range of motion, no tenderness and no bony tenderness.       Cervical back: He exhibits decreased range of motion. He exhibits no tenderness and no bony tenderness.       Lumbar back: He exhibits no tenderness and no bony tenderness.  Right elbow and wrist: No  deformity, no edema, normal ROM. Pain is not elicited with movement or palpation.  Lymphadenopathy:    He has no cervical adenopathy.  Neurological: He is alert and oriented to person, place, and time. He has normal strength.  Reflex Scores:      Patellar reflexes are 2+ on the  right side and 2+ on the left side.      Achilles reflexes are 2+ on the right side and 2+ on the left side. SLR negative bilateral. He can walk on heels and tiptoes with no pain or limitation. Stable gait without assistance.  Skin: Skin is warm. Ecchymosis (R forearm: x 2,: below elbow and ulnar aspect fo wrist, 2 cm each one) noted. No erythema.  Psychiatric: He has a normal mood and affect.  Well groomed, good eye contact.    ASSESSMENT AND PLAN:   Jonathan Graham was seen today for fall.  Diagnoses and all orders for this visit:  Acute bilateral low back pain without sciatica  Examination today does not suggest fractures or dislocation. He is reporting improvement of pain. Because he had severe pain at night while in bed, I think it is appropriate lumbar imaging. Instructed about warning signs. Because of history of CHF, I told not recommending OTC NSAIDs. Tylenol 500 mg 3 times per day as needed and local icy hot patch might help. Instructed about warning signs. Follow-up as needed with PCP.  -     DG Lumbar Spine Complete; Future  Fall, accidental, initial encounter  We discussed fall prevention. Alateen imaging of right upper extremity or lower extremity are needed at this time.  -     DG Lumbar Spine Complete; Future  Cough due to ACE inhibitor  Lung auscultation negative. Some side effects of Lisinopril discussed. He is going to see Dr. [name] this coming Monday and prefers to hold on changing Lisinopril for Cozaar until he sees cardiologist.     -JonathanDonetta Potts was advised to seek immediate medical attention if sudden worsening symptoms or to follow if they persist or if new  concerns arise.       Davine Coba G. [name], MD  [facility] [place] office.

## 2017-09-08 ENCOUNTER — Ambulatory Visit: Payer: Self-pay | Admitting: *Deleted

## 2017-09-08 ENCOUNTER — Telehealth: Payer: Self-pay | Admitting: Family Medicine

## 2017-09-08 NOTE — Progress Notes (Signed)
Cardiology Office Note   Date:  09/13/2017   ID:  Jonathan Graham, DOB 02-01-1928, MRN [id number]  PCP:  [name], MD  Cardiologist: Orabelle Rylee [name] MD  Chief Complaint  Patient presents with  . Follow-up  . Congestive Heart Failure      History of Present Illness: Jonathan Graham is a 84 y.o. male who presents for follow up Aortic stenosis, CHF, LBBB, and HLD.    He has a history of known left bundle branch block and a history of hypercholesterolemia.  He has a history of prostate CA treated with radiation therapy. The patient has a history of  aortic stenosis.  When seen earlier this year Echo demonstrated at least moderate AS. EF had dropped from 40-45% to 25-30%. As such a Dobutamine Echo and suggested he had low gradient severe AS. He subsequently underwent TAVR with Dr. Angelena Form on 12/08/16 with # 10 Edwards Sapien valve. He did well with this. Recommended DAPT for 6 months. Repeat Echo 01/08/17 showed normal function of valve but persistently low EF 30-35%. He was seen in June with an episode of orthostatic hypotension. Has not been on any BP meds. Compression hose ordered but he was not able to tolerate since he said they aggravated his large varicosities.   On follow up today he is doing well. He denies any chest pain or dyspnea. We tried him on a very low dose of lisinopril but he has developed a dry cough and runny nose. He had a recent fall - slipping on a wet board and hurt his back. Xrays showed no fracture. He does complain of constipation.   Past Medical History:  Diagnosis Date  . Arthritis   . Atypical chest pain    history of   . Back pain   . Cancer [facility]) 2014   prostate  . Cardiovascular stress test abnormal 04/25/2004   Abnormal adenosine Cardiolite study / evidence of inferoseptal ischemia &  an EF of 48% ""recommended that he undergo cardiac catheterization""  . Cervical spine disease   . Dizziness    occasionally apon bending over  . Fatigue     history of   . Heart murmur   . History of hiatal hernia   . History of mononucleosis   . Hypercholesterolemia   . Left bundle branch block   . Pleurisy 07/2005  . S/P radiation therapy  12/05/2012 through 01/30/2013                                                         Prostate 7800 cGy 40 sessions, seminal vesicles 5600 cGy 40 sessions                       . Shortness of breath   . Spondylosis, cervical    at C3-C4  . Varicose veins   . Weakness    history of     Past Surgical History:  Procedure Laterality Date  . BACK SURGERY  1995    2 ruptured discs in lower back / by Dr. Louanne Skye  . CARDIAC CATHETERIZATION  2005   EF estimated at 50% /  PROCEDURE:  Left heart catheterization, coronary and left ventricular angiography /  RAO view demonstrates mild LV enlargement  / mild hypokinesia anteroseptal with  overall low normal LV systolic function / Normal coronary anatomy  . HEMORRHOID SURGERY    . PROSTATE BIOPSY  08/25/2012   Gleason 3+3=6 PSA 7.80  . RIGHT/LEFT HEART CATH AND CORONARY ANGIOGRAPHY N/A 11/13/2016   Procedure: Right/Left Heart Cath and Coronary Angiography;  Surgeon: Keydi Giel M [name], MD;  Location: [place] CV LAB;  Service: Cardiovascular;  Laterality: N/A;  . TEE WITHOUT CARDIOVERSION N/A 12/08/2016   Procedure: TRANSESOPHAGEAL ECHOCARDIOGRAM (TEE);  Surgeon: Burnell Blanks, MD;  Location: [place];  Service: Open Heart Surgery;  Laterality: N/A;  . TONSILLECTOMY AND ADENOIDECTOMY    . TRANSCATHETER AORTIC VALVE REPLACEMENT, TRANSFEMORAL N/A 12/08/2016   Procedure: TRANSCATHETER AORTIC VALVE REPLACEMENT, TRANSFEMORAL;  Surgeon: Burnell Blanks, MD;  Location: [place];  Service: Open Heart Surgery;  Laterality: N/A;     Current Outpatient Medications  Medication Sig Dispense Refill  . amoxicillin (AMOXIL) 500 MG tablet Take 4 tablets by mouth 30-60 minutes prior to dental procedure 4 tablet 4  . aspirin EC 81 MG tablet Take 81 mg by mouth daily.      . simvastatin (ZOCOR) 10 MG tablet TAKE ONE-HALF TABLET BY  MOUTH AT BEDTIME 45 tablet 1  . tamsulosin (FLOMAX) 0.4 MG CAPS capsule Take 0.4 mg by mouth daily. Daily during the evening    . losartan (COZAAR) 25 MG tablet Take 0.5 tablets (12.5 mg total) by mouth daily. 45 tablet 3   No current facility-administered medications for this visit.     Allergies:   Lipitor [atorvastatin calcium]    Social History:  The patient  reports that he quit smoking about 66 years ago. His smoking use included cigarettes. He started smoking about 68 years ago. He has a 2.00 pack-year smoking history. he has never used smokeless tobacco. He reports that he does not drink alcohol or use drugs.   Family History:  The patient's family history includes Cancer in his father; Heart disease in his mother; Heart failure in his mother; Lung cancer in his brother; Multiple sclerosis in his sister; Stroke in his father.    ROS:  Please see the history of present illness. Otherwise, review of systems are positive for none.   All other systems are reviewed and negative.    PHYSICAL EXAM: VS:  BP 112/68   Pulse 78   Ht 5\' 9"  (1.753 m)   Wt 157 lb (71.2 kg)   BMI 23.18 kg/m  , BMI Body mass index is 23.18 kg/m. GENERAL:  Well appearing, elderly WM in NAD HEENT:  PERRL, EOMI, sclera are clear. Oropharynx is clear. NECK:  No jugular venous distention, carotid upstroke brisk and symmetric, no bruits, no thyromegaly or adenopathy LUNGS:  Clear to auscultation bilaterally CHEST:  Unremarkable HEART:  RRR,  PMI not displaced or sustained,S1 and S2 within normal limits, no S3, no S4: no clicks, no rubs, soft 1/6 systolic murmur RUSB. ABD:  Soft, nontender. BS +, no masses or bruits. No hepatomegaly, no splenomegaly EXT:  2 + pulses throughout, no edema, no cyanosis no clubbing SKIN:  Warm and dry.  No rashes NEURO:  Alert and oriented x 3. Cranial nerves II through XII intact. PSYCH:  Cognitively intact    EKG:   EKG is not ordered today.   Recent Labs: 12/09/2016: Magnesium 1.9 02/09/2017: Hemoglobin 12.1; Platelets 209.0 06/03/2017: ALT 10 07/08/2017: BUN 22; Creatinine, Ser 1.07; Potassium 4.8; Sodium 140    Lipid Panel    Component Value Date/Time   CHOL 156 06/03/2017  0175   TRIG 66 06/03/2017 0832   HDL 58 06/03/2017 0832   CHOLHDL 2.7 06/03/2017 0832   CHOLHDL 2.7 09/24/2016 0803   VLDL 11 09/24/2016 0803   LDLCALC 85 06/03/2017 0832      Wt Readings from Last 3 Encounters:  09/13/17 157 lb (71.2 kg)  09/06/17 161 lb 6 oz (73.2 kg)  06/07/17 163 lb 12.8 oz (74.3 kg)     Cardiac cath March 2018: Procedures   Right/Left Heart Cath and Coronary Angiography  Conclusion     Ost 1st Mrg to 1st Mrg lesion, 50 %stenosed.  There is moderate to severe left ventricular systolic dysfunction.  LV end diastolic pressure is normal.  The left ventricular ejection fraction is 25-35% by visual estimate.  LV end diastolic pressure is normal.  There is moderate aortic valve stenosis.   1. Mild nonobstructive CAD 2. Normal right heart pressures 3. Normal LV filling pressures  4. Moderate Aortic stenosis with mean gradient of 26 mm Hg. AV area may be underestimated due to low EF.   Plan: consider AVR/TAVR.     Echo 01/08/17: Study Conclusions  - Left ventricle: The cavity size was normal. Wall thickness was   normal. Systolic function was moderately to severely reduced. The   estimated ejection fraction was in the range of 30% to 35%. Wall   motion was normal; there were no regional wall motion   abnormalities. Doppler parameters are consistent with abnormal   left ventricular relaxation (grade 1 diastolic dysfunction). - Ventricular septum: Septal motion showed abnormal function and   dyssynergy. - Aortic valve: A TAVR bioprosthesis was present and functioning   normally. Mean gradient (S): 12 mm Hg. Peak gradient (S): 26 mm   Hg. - Mitral valve: Calcified annulus. - Left  atrium: The atrium was mildly dilated. - Pulmonary arteries: Systolic pressure was moderately increased.   PA peak pressure: 50 mm Hg (S).  Impressions:  - Compared to the prior study, there has been no significant   interval change.   ASSESSMENT AND PLAN:  1. Severe  aortic stenosis- s/p TAVR. He has made an excellent recovery. Continue ASA indefinitely. 2. Hypercholesterolemia- fair control. On Zocor.  3. Prostate cancer treated with radiation therapy 4. LBBB chronic.  5. Nonischemic cardiomyopathy with EF 30-35%. Currently on no CHF therapy. Patient does have a history of orthostatic hypotension. BP OK on low dose lisinopril but now with side effects on ACEi. Will switch to losartan 12.5 mg daily.  6. History of orthostatic hypotension. 7. Constipation. Recommend a stool softener daily and prn Miralax.    Current medicines are reviewed at length with the patient today.  The patient does not have concerns regarding medicines.  The following changes have been made: none  Labs/ tests ordered today include:   No orders of the defined types were placed in this encounter.    Signed, Sammie Denner [name] MD, [facility]   09/13/2017 1:07 PM    [place]

## 2017-09-08 NOTE — Telephone Encounter (Signed)
Pts wife reporting pt has not had a BM since 09/03/17 when pt fell(seen by MD).  Pharmacy recommended Mag Citrate which pt took as directed. Finished bottle 1 hour ago. Pt concerned he has not had a BM yet. Instructed pt may take longer than 1 hour to be effective. Pt also reports normal bowel pattern is every 3 days. Home care advise given.

## 2017-09-13 ENCOUNTER — Ambulatory Visit: Payer: Medicare Other | Admitting: Cardiology

## 2017-09-13 ENCOUNTER — Encounter: Payer: Self-pay | Admitting: Cardiology

## 2017-09-13 VITALS — BP 112/68 | HR 78 | Ht 69.0 in | Wt 157.0 lb

## 2017-09-13 DIAGNOSIS — Z952 Presence of prosthetic heart valve: Secondary | ICD-10-CM

## 2017-09-13 DIAGNOSIS — I447 Left bundle-branch block, unspecified: Secondary | ICD-10-CM | POA: Diagnosis not present

## 2017-09-13 DIAGNOSIS — I5022 Chronic systolic (congestive) heart failure: Secondary | ICD-10-CM | POA: Diagnosis not present

## 2017-09-13 DIAGNOSIS — I350 Nonrheumatic aortic (valve) stenosis: Secondary | ICD-10-CM

## 2017-09-13 MED ORDER — LOSARTAN POTASSIUM 25 MG PO TABS: 12.5000 mg | ORAL_TABLET | Freq: Every day | ORAL | 3 refills | Status: DC

## 2017-09-13 NOTE — Patient Instructions (Signed)
Stop taking lisinopril  Start losartan 12.5 mg daily  Use a stool softener daily  Take Miralax prn for constipation

## 2017-09-16 DIAGNOSIS — H43393 Other vitreous opacities, bilateral: Secondary | ICD-10-CM | POA: Diagnosis not present

## 2017-09-20 ENCOUNTER — Other Ambulatory Visit: Payer: Self-pay

## 2017-09-20 DIAGNOSIS — Z952 Presence of prosthetic heart valve: Secondary | ICD-10-CM

## 2017-09-20 DIAGNOSIS — I350 Nonrheumatic aortic (valve) stenosis: Secondary | ICD-10-CM

## 2017-10-11 ENCOUNTER — Other Ambulatory Visit: Payer: Self-pay | Admitting: Cardiology

## 2017-10-12 DIAGNOSIS — L570 Actinic keratosis: Secondary | ICD-10-CM | POA: Diagnosis not present

## 2017-10-12 DIAGNOSIS — D229 Melanocytic nevi, unspecified: Secondary | ICD-10-CM | POA: Diagnosis not present

## 2017-10-12 DIAGNOSIS — L218 Other seborrheic dermatitis: Secondary | ICD-10-CM | POA: Diagnosis not present

## 2017-10-12 DIAGNOSIS — L821 Other seborrheic keratosis: Secondary | ICD-10-CM | POA: Diagnosis not present

## 2017-10-12 DIAGNOSIS — L819 Disorder of pigmentation, unspecified: Secondary | ICD-10-CM | POA: Diagnosis not present

## 2017-10-12 NOTE — Telephone Encounter (Signed)
REFILL 

## 2017-10-17 ENCOUNTER — Other Ambulatory Visit: Payer: Self-pay | Admitting: Cardiology

## 2017-11-22 ENCOUNTER — Ambulatory Visit (INDEPENDENT_AMBULATORY_CARE_PROVIDER_SITE_OTHER): Payer: Medicare Other | Admitting: Family Medicine

## 2017-11-22 ENCOUNTER — Encounter: Payer: Self-pay | Admitting: Family Medicine

## 2017-11-22 VITALS — BP 100/60 | HR 88 | Temp 97.9°F | Wt 154.7 lb

## 2017-11-22 DIAGNOSIS — I5022 Chronic systolic (congestive) heart failure: Secondary | ICD-10-CM

## 2017-11-22 DIAGNOSIS — I951 Orthostatic hypotension: Secondary | ICD-10-CM

## 2017-11-22 DIAGNOSIS — M545 Low back pain, unspecified: Secondary | ICD-10-CM

## 2017-11-22 NOTE — Progress Notes (Signed)
Subjective:     Patient ID: Jonathan Graham, male   DOB: 03-17-1928, 84 y.o.   MRN: [id number]  HPI Patient for medical follow-up. History of aortic stenosis, chronic systolic heart failure, osteoarthritis involving multiple joints, history of prostate cancer, hyperlipidemia  He has history of orthostatic hypotension and is currently taking only very low dose losartan, simvastatin, Flomax, and aspirin.  Fall back in January. He slipped on a wet board and landed very hard on his head and back. No LOC.  Had some low back pain since then. X-rays revealed no acute changes. Slowly improving. Back pain is bilateral.  Severe osteoarthritis right knee which is limiting activities. Wife states that he "sleeps a lot". He apparently has good quality sleep at night but is taking frequent daytime naps. No dyspnea at rest. Had aortic valve replacement transcutaneously last year but has not seen much improvement in symptoms since then  Past Medical History:  Diagnosis Date  . Arthritis   . Atypical chest pain    history of   . Back pain   . Cancer [facility]) 2014   prostate  . Cardiovascular stress test abnormal 04/25/2004   Abnormal adenosine Cardiolite study / evidence of inferoseptal ischemia &  an EF of 48% ""recommended that he undergo cardiac catheterization""  . Cervical spine disease   . Dizziness    occasionally apon bending over  . Fatigue    history of   . Heart murmur   . History of hiatal hernia   . History of mononucleosis   . Hypercholesterolemia   . Left bundle branch block   . Pleurisy 07/2005  . S/P radiation therapy  12/05/2012 through 01/30/2013                                                         Prostate 7800 cGy 40 sessions, seminal vesicles 5600 cGy 40 sessions                       . Shortness of breath   . Spondylosis, cervical    at C3-C4  . Varicose veins   . Weakness    history of    Past Surgical History:  Procedure Laterality Date  . BACK SURGERY  1995    2 ruptured discs in lower back / by Dr. Louanne Skye  . CARDIAC CATHETERIZATION  2005   EF estimated at 50% /  PROCEDURE:  Left heart catheterization, coronary and left ventricular angiography /  RAO view demonstrates mild LV enlargement  / mild hypokinesia anteroseptal with  overall low normal LV systolic function / Normal coronary anatomy  . HEMORRHOID SURGERY    . PROSTATE BIOPSY  08/25/2012   Gleason 3+3=6 PSA 7.80  . RIGHT/LEFT HEART CATH AND CORONARY ANGIOGRAPHY N/A 11/13/2016   Procedure: Right/Left Heart Cath and Coronary Angiography;  Surgeon: [name] [name], MD;  Location: [place] [facility];  Service: Cardiovascular;  Laterality: N/A;  . TEE WITHOUT CARDIOVERSION N/A 12/08/2016   Procedure: TRANSESOPHAGEAL ECHOCARDIOGRAM (TEE);  Surgeon: Burnell Blanks, MD;  Location: [place];  Service: Open Heart Surgery;  Laterality: N/A;  . TONSILLECTOMY AND ADENOIDECTOMY    . TRANSCATHETER AORTIC VALVE REPLACEMENT, TRANSFEMORAL N/A 12/08/2016   Procedure: TRANSCATHETER AORTIC VALVE REPLACEMENT, TRANSFEMORAL;  Surgeon: Burnell Blanks, MD;  Location:  [place] OR;  Service: Open Heart Surgery;  Laterality: N/A;    reports that he quit smoking about 66 years ago. His smoking use included cigarettes. He started smoking about 68 years ago. He has a 2.00 pack-year smoking history. He has never used smokeless tobacco. He reports that he does not drink alcohol or use drugs. family history includes Cancer in his father; Heart disease in his mother; Heart failure in his mother; Lung cancer in his brother; Multiple sclerosis in his sister; Stroke in his father. Allergies  Allergen Reactions  . Lipitor [Atorvastatin Calcium] Other (See Comments)    MYALGIAS CRAMPS  . Lisinopril Cough  \   Review of Systems  Constitutional: Positive for fatigue. Negative for appetite change.  Eyes: Negative for visual disturbance.  Respiratory: Negative for cough, chest tightness and shortness of breath.   Cardiovascular:  Negative for chest pain, palpitations and leg swelling.  Endocrine: Negative for polydipsia and polyuria.  Genitourinary: Negative for dysuria.  Musculoskeletal: Positive for back pain.  Neurological: Negative for dizziness, syncope, weakness, light-headedness and headaches.       Objective:   Physical Exam  Constitutional: He is oriented to person, place, and time. He appears well-developed and well-nourished.  HENT:  Right Ear: External ear normal.  Left Ear: External ear normal.  Mouth/Throat: Oropharynx is clear and moist.  Eyes: Pupils are equal, round, and reactive to light.  Neck: Neck supple. No thyromegaly present.  Cardiovascular: Normal rate and regular rhythm.  Pulmonary/Chest: Effort normal and breath sounds normal. No respiratory distress. He has no wheezes. He has no rales.  Musculoskeletal: He exhibits no edema.  Neurological: He is alert and oriented to person, place, and time.       Assessment:     #1 bilateral lumbar back pain without sciatica following fall. Slowly improving  #2 history of orthostatic hypotension. Blood pressure today seated left arm 100/60 and standing 94/60  #3 history of systolic heart failure symptomatically stable    Plan:     -Emphasized importance of good hydration -Increased activity as tolerated -change positions slowly. -routine follow up in 6 months and sooner prn.  [name] MD [facility]

## 2017-12-13 ENCOUNTER — Encounter: Payer: Self-pay | Admitting: Cardiovascular Disease

## 2017-12-13 ENCOUNTER — Other Ambulatory Visit: Payer: Self-pay

## 2017-12-13 ENCOUNTER — Ambulatory Visit: Payer: Medicare Other | Admitting: Cardiovascular Disease

## 2017-12-13 ENCOUNTER — Ambulatory Visit (HOSPITAL_COMMUNITY): Payer: Medicare Other | Attending: Cardiology

## 2017-12-13 VITALS — BP 124/70 | HR 61 | Ht 69.0 in | Wt 155.8 lb

## 2017-12-13 DIAGNOSIS — I340 Nonrheumatic mitral (valve) insufficiency: Secondary | ICD-10-CM | POA: Insufficient documentation

## 2017-12-13 DIAGNOSIS — R42 Dizziness and giddiness: Secondary | ICD-10-CM | POA: Insufficient documentation

## 2017-12-13 DIAGNOSIS — E785 Hyperlipidemia, unspecified: Secondary | ICD-10-CM | POA: Diagnosis not present

## 2017-12-13 DIAGNOSIS — Z8546 Personal history of malignant neoplasm of prostate: Secondary | ICD-10-CM | POA: Insufficient documentation

## 2017-12-13 DIAGNOSIS — Z952 Presence of prosthetic heart valve: Secondary | ICD-10-CM | POA: Diagnosis not present

## 2017-12-13 DIAGNOSIS — Z48812 Encounter for surgical aftercare following surgery on the circulatory system: Secondary | ICD-10-CM | POA: Insufficient documentation

## 2017-12-13 DIAGNOSIS — Z87891 Personal history of nicotine dependence: Secondary | ICD-10-CM | POA: Diagnosis not present

## 2017-12-13 DIAGNOSIS — I350 Nonrheumatic aortic (valve) stenosis: Secondary | ICD-10-CM

## 2017-12-13 DIAGNOSIS — I509 Heart failure, unspecified: Secondary | ICD-10-CM | POA: Insufficient documentation

## 2017-12-13 DIAGNOSIS — R0600 Dyspnea, unspecified: Secondary | ICD-10-CM | POA: Insufficient documentation

## 2017-12-13 DIAGNOSIS — I447 Left bundle-branch block, unspecified: Secondary | ICD-10-CM | POA: Diagnosis not present

## 2017-12-13 DIAGNOSIS — Z8249 Family history of ischemic heart disease and other diseases of the circulatory system: Secondary | ICD-10-CM | POA: Diagnosis not present

## 2017-12-13 NOTE — Progress Notes (Signed)
TAVR Clinic Note  Chief Complaint  Patient presents with  . Follow-up    one year TAVR   History of Present Illness: 84 yo male with history of HLD, LBBB, prostate cancer, cardiomyopathy, CAD and severe aortic stenosis who is here today for one year TAVR follow up. He is followed in our office by Dr. [name]. He underwent TAVR on 12/08/16 with placement of a 29 mm Edwards Sapien 3 bioprosthetic AVR. LVEF was improved slightly at 30-35% post valve replacement. His post-operative course was uncomplicated and he was discharged home 2 days post procedure.    He is here today for follow up. The patient denies any chest pain, dyspnea, palpitations, lower extremity edema, orthopnea, PND, dizziness, near syncope or syncope. He continues to have occasional low blood pressures.   Primary Care Physician: [name], MD  Primary Cardiology: [name]  Past Medical History:  Diagnosis Date  . Arthritis   . Atypical chest pain    history of   . Back pain   . Cancer [facility]) 2014   prostate  . Cardiovascular stress test abnormal 04/25/2004   Abnormal adenosine Cardiolite study / evidence of inferoseptal ischemia &  an EF of 48% ""recommended that he undergo cardiac catheterization""  . Cervical spine disease   . Dizziness    occasionally apon bending over  . Fatigue    history of   . Heart murmur   . History of hiatal hernia   . History of mononucleosis   . Hypercholesterolemia   . Left bundle branch block   . Pleurisy 07/2005  . S/P radiation therapy  12/05/2012 through 01/30/2013                                                         Prostate 7800 cGy 40 sessions, seminal vesicles 5600 cGy 40 sessions                       . Shortness of breath   . Spondylosis, cervical    at C3-C4  . Varicose veins   . Weakness    history of     Past Surgical History:  Procedure Laterality Date  . BACK SURGERY  1995    2 ruptured discs in lower back / by Dr. Louanne Skye  . CARDIAC CATHETERIZATION   2005   EF estimated at 50% /  PROCEDURE:  Left heart catheterization, coronary and left ventricular angiography /  RAO view demonstrates mild LV enlargement  / mild hypokinesia anteroseptal with  overall low normal LV systolic function / Normal coronary anatomy  . HEMORRHOID SURGERY    . PROSTATE BIOPSY  08/25/2012   Gleason 3+3=6 PSA 7.80  . RIGHT/LEFT HEART CATH AND CORONARY ANGIOGRAPHY N/A 11/13/2016   Procedure: Right/Left Heart Cath and Coronary Angiography;  Surgeon: [name] [name], MD;  Location: [place] [facility];  Service: Cardiovascular;  Laterality: N/A;  . TEE WITHOUT CARDIOVERSION N/A 12/08/2016   Procedure: TRANSESOPHAGEAL ECHOCARDIOGRAM (TEE);  Surgeon: Burnell Blanks, MD;  Location: [place];  Service: Open Heart Surgery;  Laterality: N/A;  . TONSILLECTOMY AND ADENOIDECTOMY    . TRANSCATHETER AORTIC VALVE REPLACEMENT, TRANSFEMORAL N/A 12/08/2016   Procedure: TRANSCATHETER AORTIC VALVE REPLACEMENT, TRANSFEMORAL;  Surgeon: Burnell Blanks, MD;  Location: [place];  Service: Open  Heart Surgery;  Laterality: N/A;    Current Outpatient Medications  Medication Sig Dispense Refill  . amoxicillin (AMOXIL) 500 MG tablet Take 4 tablets by mouth 30-60 minutes prior to dental procedure 4 tablet 4  . aspirin EC 81 MG tablet Take 81 mg by mouth daily.    [name] losartan (COZAAR) 25 MG tablet Take 0.5 tablets (12.5 mg total) by mouth daily. 45 tablet 3  . simvastatin (ZOCOR) 10 MG tablet TAKE ONE-HALF TABLET BY  MOUTH AT BEDTIME 45 tablet 1  . tamsulosin (FLOMAX) 0.4 MG CAPS capsule TAKE 1 CAPSULE BY MOUTH  DAILY 90 capsule 1   No current facility-administered medications for this visit.     Allergies  Allergen Reactions  . Lipitor [Atorvastatin Calcium] Other (See Comments)    MYALGIAS CRAMPS  . Lisinopril Cough    Social History   Socioeconomic History  . Marital status: Married    Spouse name: Not on file  . Number of children: 2  . Years of education: Not on file  .  Highest education level: Not on file  Occupational History  . Occupation: Insurance    Comment: Printmaker  . Financial resource strain: Not on file  . Food insecurity:    Worry: Not on file    Inability: Not on file  . Transportation needs:    Medical: Not on file    Non-medical: Not on file  Tobacco Use  . Smoking status: Former Smoker    Packs/day: 1.00    Years: 2.00    Pack years: 2.00    Types: Cigarettes    Start date: 08/31/1949    Last attempt to quit: 09/01/1951    Years since quitting: 66.3  . Smokeless tobacco: Never Used  Substance and Sexual Activity  . Alcohol use: No  . Drug use: No  . Sexual activity: Not on file  Lifestyle  . Physical activity:    Days per week: Not on file    Minutes per session: Not on file  . Stress: Not on file  Relationships  . Social connections:    Talks on phone: Not on file    Gets together: Not on file    Attends religious service: Not on file    Active member of club or organization: Not on file    Attends meetings of clubs or organizations: Not on file    Relationship status: Not on file  . Intimate partner violence:    Fear of current or ex partner: Not on file    Emotionally abused: Not on file    Physically abused: Not on file    Forced sexual activity: Not on file  Other Topics Concern  . Not on file  Social History Narrative  . Not on file    Family History  Problem Relation Age of Onset  . Stroke Father   . Cancer Father   . Heart failure Mother   . Heart disease Mother   . Multiple sclerosis Sister   . Lung cancer Brother     Review of Systems:  As stated in the HPI and otherwise negative.   BP 124/70   Pulse 61   Ht 5\' 9"  (1.753 m)   Wt 155 lb 12.8 oz (70.7 kg)   BMI 23.01 kg/m   Physical Examination:  General: Well developed, well nourished, NAD  HEENT: OP clear, mucus membranes moist  SKIN: warm, dry. No rashes. Neuro: No focal deficits  Musculoskeletal: Muscle  strength 5/5 all ext  Psychiatric: Mood and affect normal  Neck: No JVD, no carotid bruits, no thyromegaly, no lymphadenopathy.  Lungs:Clear bilaterally, no wheezes, rhonci, crackles Cardiovascular: Regular rate and rhythm. No murmurs, gallops or rubs. Abdomen:Soft. Bowel sounds present. Non-tender.  Extremities: No lower extremity edema. Pulses are 2 + in the bilateral DP/PT.  Echo 12/13/17: - Left ventricle: The cavity size was normal. Wall thickness was   normal. Septal-lateral dyssynchrony with diffuse hypokinesis.   Systolic function was moderately to severely reduced. The   estimated ejection fraction was in the range of 30% to 35%.   Doppler parameters are consistent with abnormal left ventricular   relaxation (grade 1 diastolic dysfunction). - Aortic valve: Bioprosthetic aortic valve s/p TAVR. Trivial   peri-valvular regurgitation. No significant stenosis. Mean   gradient (S): 12 mm Hg. Valve area (VTI): 1.93 cm^2. - Mitral valve: Mildly calcified annulus. There was mild   regurgitation. - Left atrium: The atrium was mildly dilated. - Right ventricle: The cavity size was normal. Systolic function   was normal. - Right atrium: The atrium was mildly dilated. - Tricuspid valve: Peak RV-RA gradient (S): 26 mm Hg. - Pulmonary arteries: PA peak pressure: 29 mm Hg (S). - Inferior vena cava: The vessel was normal in size. The   respirophasic diameter changes were in the normal range (>= 50%),   consistent with normal central venous pressure.  Impressions:  - Normal LV size with EF 30-35%. Diffuse hypokinesis with   septal-lateral dyssynchrony. Normal RV size and systolic   function. Bioprosthetic aortic valve s/p TAVR with trivial AI, no   significant stenosis.   EKG:  EKG is ordered today. This demonstrates Sinus, rate 67 bpm, LBBB  Recent Labs: 02/09/2017: Hemoglobin 12.1; Platelets 209.0 06/03/2017: ALT 10 07/08/2017: BUN 22; Creatinine, Ser 1.07; Potassium 4.8; Sodium  140   Lipid Panel    Component Value Date/Time   CHOL 156 06/03/2017 0832   TRIG 66 06/03/2017 0832   HDL 58 06/03/2017 0832   CHOLHDL 2.7 06/03/2017 0832   CHOLHDL 2.7 09/24/2016 0803   VLDL 11 09/24/2016 0803   LDLCALC 85 06/03/2017 0832     Wt Readings from Last 3 Encounters:  12/13/17 155 lb 12.8 oz (70.7 kg)  11/22/17 154 lb 11.2 oz (70.2 kg)  09/13/17 157 lb (71.2 kg)     Other studies Reviewed: Additional studies/ records that were reviewed today include: . Review of the above records demonstrates:   Assessment and Plan:   1. Severe aortic stenosis: He is one year post TAVR. He is doing well. He is NYHA class 2. Echo today shows normally functioning AVR with mean gradient of 12 mmHg and trivial AI. LV systolic function is moderately reduced, LVEF is 30-35%, unchanged. Will continue ASA. He will need to use antibiotics for SBE prophylaxis as indicated.   Follow up with DR. [name] as planned this summer.   Current medicines are reviewed at length with the patient today.  The patient does not have concerns regarding medicines.  The following changes have been made:  no change  Labs/ tests ordered today include:  No orders of the defined types were placed in this encounter.  Disposition:   FU with me in one year with echo. Keep planned f/u with Dr. [name]   Signed, Lauree Chandler, MD 12/13/2017 4:21 PM    [facility] [place], [place], [place]  [address] Phone: [phone]; Fax: [phone]

## 2017-12-13 NOTE — Patient Instructions (Signed)
Medication Instructions:  Your physician recommends that you continue on your current medications as directed. Please refer to the Current Medication list given to you today.   Labwork: none  Testing/Procedures: none  Follow-Up: Your physician recommends that you schedule a follow-up appointment [id number] with Dr. [name].  Scheduled for July 15,2019 at 3:40    Any Other Special Instructions Will Be Listed Below (If Applicable).     If you need a refill on your cardiac medications before your next appointment, please call your pharmacy.

## 2017-12-29 DIAGNOSIS — C61 Malignant neoplasm of prostate: Secondary | ICD-10-CM | POA: Diagnosis not present

## 2018-01-05 DIAGNOSIS — C61 Malignant neoplasm of prostate: Secondary | ICD-10-CM | POA: Diagnosis not present

## 2018-02-03 ENCOUNTER — Encounter: Payer: Self-pay | Admitting: Thoracic Surgery (Cardiothoracic Vascular Surgery)

## 2018-02-16 ENCOUNTER — Other Ambulatory Visit: Payer: Self-pay | Admitting: Cardiovascular Disease

## 2018-02-16 NOTE — Telephone Encounter (Signed)
Left message for patient to call back on home [one and home phone. Questioning if patient has upcoming dental work . Prescription requested by pharmacy

## 2018-02-16 NOTE — Telephone Encounter (Signed)
Spoke to patient's wife. She states patient has an appointment with dental office on Monday June 24,2019.  E-SENT PRESCRIPTION

## 2018-02-16 NOTE — Telephone Encounter (Signed)
This is Dr. Jordan's pt. °

## 2018-03-13 NOTE — Progress Notes (Signed)
Cardiology Office Note   Date:  03/14/2018   ID:  Jonathan Graham, DOB 07/19/1928, MRN [id number]  PCP:  [name], MD  Cardiologist: Belen Pesch [name] MD  Chief Complaint  Patient presents with  . Congestive Heart Failure      History of Present Illness: Jonathan Graham is a 84 y.o. male who presents for follow up Aortic stenosis, CHF, LBBB, and HLD.    He has a history of known left bundle branch block and a history of hypercholesterolemia.  He has a history of prostate CA treated with radiation therapy. The patient has a history of  aortic stenosis.  When seen earlier this year Echo demonstrated at least moderate AS. EF had dropped from 40-45% to 25-30%. As such a Dobutamine Echo and suggested he had low gradient severe AS. He subsequently underwent TAVR with Dr. Angelena Form on 12/08/16 with # 3 Edwards Sapien valve. He did well with this. Recommended DAPT for 6 months. Repeat Echo 01/08/17 showed normal function of valve but persistently low EF 30-35%. He was seen in June with an episode of orthostatic hypotension. Has not been on any BP meds. Compression hose ordered but he was not able to tolerate since he said they aggravated his large varicosities.   On follow up today he is doing well. He denies any chest pain or dyspnea. Doesn't have as much energy as he once did. Lost 10 lbs this past year but has stabilized. Appetite is good. Bad arthritis in his knee.   Past Medical History:  Diagnosis Date  . Arthritis   . Atypical chest pain    history of   . Back pain   . Cancer [facility]) 2014   prostate  . Cardiovascular stress test abnormal 04/25/2004   Abnormal adenosine Cardiolite study / evidence of inferoseptal ischemia &  an EF of 48% ""recommended that he undergo cardiac catheterization""  . Cervical spine disease   . Dizziness    occasionally apon bending over  . Fatigue    history of   . Heart murmur   . History of hiatal hernia   . History of mononucleosis   .  Hypercholesterolemia   . Left bundle branch block   . Pleurisy 07/2005  . S/P radiation therapy  12/05/2012 through 01/30/2013                                                         Prostate 7800 cGy 40 sessions, seminal vesicles 5600 cGy 40 sessions                       . Shortness of breath   . Spondylosis, cervical    at C3-C4  . Varicose veins   . Weakness    history of     Past Surgical History:  Procedure Laterality Date  . BACK SURGERY  1995    2 ruptured discs in lower back / by Dr. Louanne Skye  . CARDIAC CATHETERIZATION  2005   EF estimated at 50% /  PROCEDURE:  Left heart catheterization, coronary and left ventricular angiography /  RAO view demonstrates mild LV enlargement  / mild hypokinesia anteroseptal with  overall low normal LV systolic function / Normal coronary anatomy  . HEMORRHOID SURGERY    . PROSTATE BIOPSY  08/25/2012   Gleason 3+3=6 PSA 7.80  . RIGHT/LEFT HEART CATH AND CORONARY ANGIOGRAPHY N/A 11/13/2016   Procedure: Right/Left Heart Cath and Coronary Angiography;  Surgeon: Ashaya Raftery M [name], MD;  Location: [facility];  Service: Cardiovascular;  Laterality: N/A;  . TEE WITHOUT CARDIOVERSION N/A 12/08/2016   Procedure: TRANSESOPHAGEAL ECHOCARDIOGRAM (TEE);  Surgeon: Burnell Blanks, MD;  Location: [facility];  Service: Open Heart Surgery;  Laterality: N/A;  . TONSILLECTOMY AND ADENOIDECTOMY    . TRANSCATHETER AORTIC VALVE REPLACEMENT, TRANSFEMORAL N/A 12/08/2016   Procedure: TRANSCATHETER AORTIC VALVE REPLACEMENT, TRANSFEMORAL;  Surgeon: Burnell Blanks, MD;  Location: [facility];  Service: Open Heart Surgery;  Laterality: N/A;     Current Outpatient Medications  Medication Sig Dispense Refill  . amoxicillin (AMOXIL) 500 MG capsule TAKE 4 CAPSULES BY MOUTH 60 MINUTES PRIOR TO DENTAL PROCEDURE 4 capsule 4  . aspirin EC 81 MG tablet Take 81 mg by mouth daily.    [name] losartan (COZAAR) 25 MG tablet Take 0.5 tablets (12.5 mg total) by mouth daily. 45 tablet 3    . simvastatin (ZOCOR) 10 MG tablet TAKE ONE-HALF TABLET BY  MOUTH AT BEDTIME 45 tablet 1  . tamsulosin (FLOMAX) 0.4 MG CAPS capsule TAKE 1 CAPSULE BY MOUTH  DAILY 90 capsule 1   No current facility-administered medications for this visit.     Allergies:   Lipitor [atorvastatin calcium] and Lisinopril    Social History:  The patient  reports that he quit smoking about 66 years ago. His smoking use included cigarettes. He started smoking about 68 years ago. He has a 2.00 pack-year smoking history. He has never used smokeless tobacco. He reports that he does not drink alcohol or use drugs.   Family History:  The patient's family history includes Cancer in his father; Heart disease in his mother; Heart failure in his mother; Lung cancer in his brother; Multiple sclerosis in his sister; Stroke in his father.    ROS:  Please see the history of present illness. Otherwise, review of systems are positive for none.   All other systems are reviewed and negative.    PHYSICAL EXAM: VS:  BP 102/62   Pulse 74   Ht 5' 9.5" (1.765 m)   Wt 154 lb 12.8 oz (70.2 kg)   SpO2 94%   BMI 22.53 kg/m  , BMI Body mass index is 22.53 kg/m. GENERAL:  Well appearing, elderly WM in NAD HEENT:  PERRL, EOMI, sclera are clear. Oropharynx is clear. NECK:  No jugular venous distention, carotid upstroke brisk and symmetric, no bruits, no thyromegaly or adenopathy LUNGS:  Clear to auscultation bilaterally CHEST:  Unremarkable HEART:  RRR,  PMI not displaced or sustained,S1 and S2 within normal limits, no S3, no S4: no clicks, no rubs, soft 1/6 systolic murmur RUSB. ABD:  Soft, nontender. BS +, no masses or bruits. No hepatomegaly, no splenomegaly EXT:  2 + pulses throughout, no edema, no cyanosis no clubbing SKIN:  Warm and dry.  No rashes NEURO:  Alert and oriented x 3. Cranial nerves II through XII intact. PSYCH:  Cognitively intact    EKG:  EKG is not ordered today.   Recent Labs: 06/03/2017: ALT  10 07/08/2017: BUN 22; Creatinine, Ser 1.07; Potassium 4.8; Sodium 140    Lipid Panel    Component Value Date/Time   CHOL 156 06/03/2017 0832   TRIG 66 06/03/2017 0832   HDL 58 06/03/2017 0832   CHOLHDL 2.7 06/03/2017 0832   CHOLHDL 2.7  09/24/2016 0803   VLDL 11 09/24/2016 0803   LDLCALC 85 06/03/2017 0832      Wt Readings from Last 3 Encounters:  03/14/18 154 lb 12.8 oz (70.2 kg)  12/13/17 155 lb 12.8 oz (70.7 kg)  11/22/17 154 lb 11.2 oz (70.2 kg)     Cardiac cath March 2018: Procedures   Right/Left Heart Cath and Coronary Angiography  Conclusion     Ost 1st Mrg to 1st Mrg lesion, 50 %stenosed.  There is moderate to severe left ventricular systolic dysfunction.  LV end diastolic pressure is normal.  The left ventricular ejection fraction is 25-35% by visual estimate.  LV end diastolic pressure is normal.  There is moderate aortic valve stenosis.   1. Mild nonobstructive CAD 2. Normal right heart pressures 3. Normal LV filling pressures  4. Moderate Aortic stenosis with mean gradient of 26 mm Hg. AV area may be underestimated due to low EF.   Plan: consider AVR/TAVR.     Echo 01/08/17: Study Conclusions  - Left ventricle: The cavity size was normal. Wall thickness was   normal. Systolic function was moderately to severely reduced. The   estimated ejection fraction was in the range of 30% to 35%. Wall   motion was normal; there were no regional wall motion   abnormalities. Doppler parameters are consistent with abnormal   left ventricular relaxation (grade 1 diastolic dysfunction). - Ventricular septum: Septal motion showed abnormal function and   dyssynergy. - Aortic valve: A TAVR bioprosthesis was present and functioning   normally. Mean gradient (S): 12 mm Hg. Peak gradient (S): 26 mm   Hg. - Mitral valve: Calcified annulus. - Left atrium: The atrium was mildly dilated. - Pulmonary arteries: Systolic pressure was moderately increased.   PA peak  pressure: 50 mm Hg (S).  Impressions:  - Compared to the prior study, there has been no significant   interval change.  Echo 12/13/17: Study Conclusions  - Left ventricle: The cavity size was normal. Wall thickness was   normal. Septal-lateral dyssynchrony with diffuse hypokinesis.   Systolic function was moderately to severely reduced. The   estimated ejection fraction was in the range of 30% to 35%.   Doppler parameters are consistent with abnormal left ventricular   relaxation (grade 1 diastolic dysfunction). - Aortic valve: Bioprosthetic aortic valve s/p TAVR. Trivial   peri-valvular regurgitation. No significant stenosis. Mean   gradient (S): 12 mm Hg. Valve area (VTI): 1.93 cm^2. - Mitral valve: Mildly calcified annulus. There was mild   regurgitation. - Left atrium: The atrium was mildly dilated. - Right ventricle: The cavity size was normal. Systolic function   was normal. - Right atrium: The atrium was mildly dilated. - Tricuspid valve: Peak RV-RA gradient (S): 26 mm Hg. - Pulmonary arteries: PA peak pressure: 29 mm Hg (S). - Inferior vena cava: The vessel was normal in size. The   respirophasic diameter changes were in the normal range (>= 50%),   consistent with normal central venous pressure.  Impressions:  - Normal LV size with EF 30-35%. Diffuse hypokinesis with   septal-lateral dyssynchrony. Normal RV size and systolic   function. Bioprosthetic aortic valve s/p TAVR with trivial AI, no   significant stenosis.   ASSESSMENT AND PLAN:  1. Severe  aortic stenosis- s/p TAVR. Doing well. Echo in April showed good valve function.  Continue ASA indefinitely. 2. Hypercholesterolemia- fair control. On Zocor.  3. Prostate cancer treated with radiation therapy 4. LBBB chronic.  5. Nonischemic cardiomyopathy with  EF 30-35%. Currently very low dose of losartan. Titration limited by hypotension. 6. History of orthostatic hypotension.   Current medicines are  reviewed at length with the patient today.  The patient does not have concerns regarding medicines.  The following changes have been made: none  Labs/ tests ordered today include:   No orders of the defined types were placed in this encounter.    Signed, Damiya Sandefur [name] MD, [facility]   03/14/2018 3:48 PM    [place]

## 2018-03-14 ENCOUNTER — Ambulatory Visit: Payer: Medicare Other | Admitting: Cardiology

## 2018-03-14 ENCOUNTER — Other Ambulatory Visit: Payer: Self-pay | Admitting: Cardiology

## 2018-03-14 ENCOUNTER — Encounter: Payer: Self-pay | Admitting: Cardiology

## 2018-03-14 VITALS — BP 102/62 | HR 74 | Ht 69.5 in | Wt 154.8 lb

## 2018-03-14 DIAGNOSIS — I447 Left bundle-branch block, unspecified: Secondary | ICD-10-CM | POA: Diagnosis not present

## 2018-03-14 DIAGNOSIS — I951 Orthostatic hypotension: Secondary | ICD-10-CM

## 2018-03-14 DIAGNOSIS — I350 Nonrheumatic aortic (valve) stenosis: Secondary | ICD-10-CM | POA: Diagnosis not present

## 2018-03-14 DIAGNOSIS — Z952 Presence of prosthetic heart valve: Secondary | ICD-10-CM | POA: Diagnosis not present

## 2018-03-14 DIAGNOSIS — I5022 Chronic systolic (congestive) heart failure: Secondary | ICD-10-CM | POA: Diagnosis not present

## 2018-03-14 NOTE — Patient Instructions (Signed)
Continue your current therapy  I will see you in 6 months.   

## 2018-03-17 ENCOUNTER — Other Ambulatory Visit: Payer: Self-pay | Admitting: Cardiology

## 2018-03-17 NOTE — Telephone Encounter (Signed)
Rx request sent to pharmacy.  

## 2018-05-20 ENCOUNTER — Ambulatory Visit: Payer: Medicare Other

## 2018-05-23 ENCOUNTER — Ambulatory Visit (INDEPENDENT_AMBULATORY_CARE_PROVIDER_SITE_OTHER): Payer: Medicare Other

## 2018-05-23 VITALS — BP 118/60 | HR 57 | Ht 69.5 in | Wt 152.6 lb

## 2018-05-23 DIAGNOSIS — Z0000 Encounter for general adult medical examination without abnormal findings: Secondary | ICD-10-CM | POA: Diagnosis not present

## 2018-05-23 NOTE — Patient Instructions (Addendum)
Jonathan Graham , Thank you for taking time to come for your Medicare Wellness Visit. I appreciate your ongoing commitment to your health goals. Please review the following plan we discussed and let me know if I can assist you in the future.   Had the first vaccine-  Will take the 2nd vaccine Shingrix is a vaccine for the prevention of Shingles in Adults 50 and older.  If you are on Medicare, the shingrix is covered under your Part D plan, so you will take both of the vaccines in the series at your pharmacy. Please check with your benefits regarding applicable copays or out of pocket expenses.  The Shingrix is given in 2 vaccines approx 8 weeks apart. You must receive the 2nd dose prior to 6 months from receipt of the first. Please have the pharmacist print out you Immunization  dates for our office records   Will hold the flu vaccine and take the "high dose flu vaccine"   The Centers for Disease Control are now recommending 2 pneumonia vaccinations after 29. The first is the Prevnar 13. This helps to boost your immunity to community acquired pneumonia as well as some protection from bacterial pneumonia  The 2nd is the pneumovax 23, which offers more broad protection!  You have took the prevnar 13 but we don't have a record of the Pneuonvax 23    These are the goals we discussed: Goals    . patient     Maintain health         This is a list of the screening recommended for you and due dates:  Health Maintenance  Topic Date Due  . Pneumonia vaccines (2 of 2 - PPSV23) 04/28/2017  . Flu Shot  03/31/2018  . Tetanus Vaccine  04/28/2026    Health Maintenance, Male A healthy lifestyle and preventive care is important for your health and wellness. Ask your health care provider about what schedule of regular examinations is right for you. What should I know about weight and diet? Eat a Healthy Diet  Eat plenty of vegetables, fruits, whole grains, low-fat dairy products, and lean  protein.  Do not eat a lot of foods high in solid fats, added sugars, or salt.  Maintain a Healthy Weight Regular exercise can help you achieve or maintain a healthy weight. You should:  Do at least 150 minutes of exercise each week. The exercise should increase your heart rate and make you sweat (moderate-intensity exercise).  Do strength-training exercises at least twice a week.  Watch Your Levels of Cholesterol and Blood Lipids  Have your blood tested for lipids and cholesterol every 5 years starting at 82 years of age. If you are at high risk for heart disease, you should start having your blood tested when you are 82 years old. You may need to have your cholesterol levels checked more often if: ? Your lipid or cholesterol levels are high. ? You are older than 82 years of age. ? You are at high risk for heart disease.  What should I know about cancer screening? Many types of cancers can be detected early and may often be prevented. Lung Cancer  You should be screened every year for lung cancer if: ? You are a current smoker who has smoked for at least 30 years. ? You are a former smoker who has quit within the past 15 years.  Talk to your health care provider about your screening options, when you should start screening, and how often  you should be screened.  Colorectal Cancer  Routine colorectal cancer screening usually begins at 82 years of age and should be repeated every 5-10 years until you are 82 years old. You may need to be screened more often if early forms of precancerous polyps or small growths are found. Your health care provider may recommend screening at an earlier age if you have risk factors for colon cancer.  Your health care provider may recommend using home test kits to check for hidden blood in the stool.  A small camera at the end of a tube can be used to examine your colon (sigmoidoscopy or colonoscopy). This checks for the earliest forms of colorectal  cancer.  Prostate and Testicular Cancer  Depending on your age and overall health, your health care provider may do certain tests to screen for prostate and testicular cancer.  Talk to your health care provider about any symptoms or concerns you have about testicular or prostate cancer.  Skin Cancer  Check your skin from head to toe regularly.  Tell your health care provider about any new moles or changes in moles, especially if: ? There is a change in a mole's size, shape, or color. ? You have a mole that is larger than a pencil eraser.  Always use sunscreen. Apply sunscreen liberally and repeat throughout the day.  Protect yourself by wearing long sleeves, pants, a wide-brimmed hat, and sunglasses when outside.  What should I know about heart disease, diabetes, and high blood pressure?  If you are 4-32 years of age, have your blood pressure checked every 3-5 years. If you are 31 years of age or older, have your blood pressure checked every year. You should have your blood pressure measured twice-once when you are at a hospital or clinic, and once when you are not at a hospital or clinic. Record the average of the two measurements. To check your blood pressure when you are not at a hospital or clinic, you can use: ? An automated blood pressure machine at a pharmacy. ? A home blood pressure monitor.  Talk to your health care provider about your target blood pressure.  If you are between 72-44 years old, ask your health care provider if you should take aspirin to prevent heart disease.  Have regular diabetes screenings by checking your fasting blood sugar level. ? If you are at a normal weight and have a low risk for diabetes, have this test once every three years after the age of 45. ? If you are overweight and have a high risk for diabetes, consider being tested at a younger age or more often.  A one-time screening for abdominal aortic aneurysm (AAA) by ultrasound is recommended  for men aged 59-75 years who are current or former smokers. What should I know about preventing infection? Hepatitis B If you have a higher risk for hepatitis B, you should be screened for this virus. Talk with your health care provider to find out if you are at risk for hepatitis B infection. Hepatitis C Blood testing is recommended for:  Everyone born from [birth year] through 1963.  Anyone with known risk factors for hepatitis C.  Sexually Transmitted Diseases (STDs)  You should be screened each year for STDs including gonorrhea and chlamydia if: ? You are sexually active and are younger than 82 years of age. ? You are older than 82 years of age and your health care provider tells you that you are at risk for this type of infection. ?  Your sexual activity has changed since you were last screened and you are at an increased risk for chlamydia or gonorrhea. Ask your health care provider if you are at risk.  Talk with your health care provider about whether you are at high risk of being infected with HIV. Your health care provider may recommend a prescription medicine to help prevent HIV infection.  What else can I do?  Schedule regular health, dental, and eye exams.  Stay current with your vaccines (immunizations).  Do not use any tobacco products, such as cigarettes, chewing tobacco, and e-cigarettes. If you need help quitting, ask your health care provider.  Limit alcohol intake to no more than 2 drinks per day. One drink equals 12 ounces of beer, 5 ounces of wine, or 1 ounces of hard liquor.  Do not use street drugs.  Do not share needles.  Ask your health care provider for help if you need support or information about quitting drugs.  Tell your health care provider if you often feel depressed.  Tell your health care provider if you have ever been abused or do not feel safe at home. This information is not intended to replace advice given to you by your health care provider. Make  sure you discuss any questions you have with your health care provider. Document Released: 02/13/2008 Document Revised: 04/15/2016 Document Reviewed: 05/21/2015 Elsevier Interactive Patient Education  2018 [place] in the Home Falls can cause injuries and can affect people from all age groups. There are many simple things that you can do to make your home safe and to help prevent falls. What can I do on the outside of my home?  Regularly repair the edges of walkways and driveways and fix any cracks.  Remove high doorway thresholds.  Trim any shrubbery on the main path into your home.  Use bright outdoor lighting.  Clear walkways of debris and clutter, including tools and rocks.  Regularly check that handrails are securely fastened and in good repair. Both sides of any steps should have handrails.  Install guardrails along the edges of any raised decks or porches.  Have leaves, snow, and ice cleared regularly.  Use sand or salt on walkways during winter months.  In the garage, clean up any spills right away, including grease or oil spills. What can I do in the bathroom?  Use night lights.  Install grab bars by the toilet and in the tub and shower. Do not use towel bars as grab bars.  Use non-skid mats or decals on the floor of the tub or shower.  If you need to sit down while you are in the shower, use a plastic, non-slip stool.  Keep the floor dry. Immediately clean up any water that spills on the floor.  Remove soap buildup in the tub or shower on a regular basis.  Attach bath mats securely with double-sided non-slip rug tape.  Remove throw rugs and other tripping hazards from the floor. What can I do in the bedroom?  Use night lights.  Make sure that a bedside light is easy to reach.  Do not use oversized bedding that drapes onto the floor.  Have a firm chair that has side arms to use for getting dressed.  Remove throw rugs and other  tripping hazards from the floor. What can I do in the kitchen?  Clean up any spills right away.  Avoid walking on wet floors.  Place frequently used items in  easy-to-reach places.  If you need to reach for something above you, use a sturdy step stool that has a grab bar.  Keep electrical cables out of the way.  Do not use floor polish or wax that makes floors slippery. If you have to use wax, make sure that it is non-skid floor wax.  Remove throw rugs and other tripping hazards from the floor. What can I do in the stairways?  Do not leave any items on the stairs.  Make sure that there are handrails on both sides of the stairs. Fix handrails that are broken or loose. Make sure that handrails are as long as the stairways.  Check any carpeting to make sure that it is firmly attached to the stairs. Fix any carpet that is loose or worn.  Avoid having throw rugs at the top or bottom of stairways, or secure the rugs with carpet tape to prevent them from moving.  Make sure that you have a light switch at the top of the stairs and the bottom of the stairs. If you do not have them, have them installed. What are some other fall prevention tips?  Wear closed-toe shoes that fit well and support your feet. Wear shoes that have rubber soles or low heels.  When you use a stepladder, make sure that it is completely opened and that the sides are firmly locked. Have someone hold the ladder while you are using it. Do not climb a closed stepladder.  Add color or contrast paint or tape to grab bars and handrails in your home. Place contrasting color strips on the first and last steps.  Use mobility aids as needed, such as canes, walkers, scooters, and crutches.  Turn on lights if it is dark. Replace any light bulbs that burn out.  Set up furniture so that there are clear paths. Keep the furniture in the same spot.  Fix any uneven floor surfaces.  Choose a carpet design that does not hide the  edge of steps of a stairway.  Be aware of any and all pets.  Review your medicines with your healthcare provider. Some medicines can cause dizziness or changes in blood pressure, which increase your risk of falling. Talk with your health care provider about other ways that you can decrease your risk of falls. This may include working with a physical therapist or trainer to improve your strength, balance, and endurance. This information is not intended to replace advice given to you by your health care provider. Make sure you discuss any questions you have with your health care provider. Document Released: 08/07/2002 Document Revised: 01/14/2016 Document Reviewed: 09/21/2014 Elsevier Interactive Patient Education  [employer].

## 2018-05-23 NOTE — Progress Notes (Addendum)
Subjective:   Jonathan Graham is a 84 y.o. male who presents for Medicare Annual/Subsequent preventive examination.  Reports health as good Last OV was in March S/p TAVR April 2018  Memory good  Prostate cancer tx with radiation  Lives in their family home Can live on top level or middle level  Went to [facility] but did not want to downsize  Diet  BMI 22 Breakfast bowl of cereal; muffin; bagel  panera  Bread for breakfast as well  Lunch; Chicken salad on cracker Super eats flounder; potato and veg Likes sweets    BMI 23  Exercise Can still split wood for downstairs furnace Will get wood pile stocked in Oct  He fell cutting wood due to snow and wife made him stop Does get help with yard now  Getting up to the bathroom to void  Dr. Risa Grill and will fup with him soon  Sleep patterns: sleeps well  Socialization  [employer] group and [employer] group  Very engaging and lively couple as his wife is here with him today   Health Maintenance Due  Topic Date Due  . PNA vac Low Risk Adult (2 of 2 - PPSV23) 04/28/2017  . INFLUENZA VACCINE  03/31/2018     Out of flu vaccines in the clinic today  PSV 23; to discuss; declined last year and this year      Objective:    Vitals: BP 118/60   Pulse (!) 57   Ht 5' 9.5" (1.765 m)   Wt 152 lb 9 oz (69.2 kg)   SpO2 98%   BMI 22.21 kg/m   Body mass index is 22.21 kg/m.  Advanced Directives 05/18/2017 12/09/2016 12/02/2016 12/01/2016 11/13/2016 01/01/2016 06/24/2014  Does Patient Have a Medical Advance Directive? Yes Yes - Yes Yes No No  Type of Advance Directive - [place];Living will [place];Living will Living will;Healthcare Power of Attorney Living will;Healthcare Power of Attorney - -  Does patient want to make changes to medical advance directive? - No - Patient declined No - Patient declined No - Patient declined No - Patient declined - -  Copy of [place] in Chart? - - No - copy requested No - copy requested No - copy requested - -  Would patient like information on creating a medical advance directive? - - - - - No - patient declined information No - patient declined information    Tobacco Social History   Tobacco Use  Smoking Status Former Smoker  . Packs/day: 1.00  . Years: 2.00  . Pack years: 2.00  . Types: Cigarettes  . Start date: 08/31/1949  . Last attempt to quit: 09/01/1951  . Years since quitting: 66.7  Smokeless Tobacco Never Used     Counseling given: Yes   Clinical Intake:      Past Medical History:  Diagnosis Date  . Arthritis   . Atypical chest pain    history of   . Back pain   . Cancer [facility]) 2014   prostate  . Cardiovascular stress test abnormal 04/25/2004   Abnormal adenosine Cardiolite study / evidence of inferoseptal ischemia &  an EF of 48% ""recommended that he undergo cardiac catheterization""  . Cervical spine disease   . Dizziness    occasionally apon bending over  . Fatigue    history of   . Heart murmur   . History of hiatal hernia   . History of mononucleosis   .  Hypercholesterolemia   . Left bundle branch block   . Pleurisy 07/2005  . S/P radiation therapy  12/05/2012 through 01/30/2013                                                         Prostate 7800 cGy 40 sessions, seminal vesicles 5600 cGy 40 sessions                       . Shortness of breath   . Spondylosis, cervical    at C3-C4  . Varicose veins   . Weakness    history of    Past Surgical History:  Procedure Laterality Date  . BACK SURGERY  1995    2 ruptured discs in lower back / by Dr. Louanne Skye  . CARDIAC CATHETERIZATION  2005   EF estimated at 50% /  PROCEDURE:  Left heart catheterization, coronary and left ventricular angiography /  RAO view demonstrates mild LV enlargement  / mild hypokinesia anteroseptal with  overall low normal LV systolic function / Normal coronary anatomy  . HEMORRHOID SURGERY    .  PROSTATE BIOPSY  08/25/2012   Gleason 3+3=6 PSA 7.80  . RIGHT/LEFT HEART CATH AND CORONARY ANGIOGRAPHY N/A 11/13/2016   Procedure: Right/Left Heart Cath and Coronary Angiography;  Surgeon: [name] [name], MD;  Location: [facility];  Service: Cardiovascular;  Laterality: N/A;  . TEE WITHOUT CARDIOVERSION N/A 12/08/2016   Procedure: TRANSESOPHAGEAL ECHOCARDIOGRAM (TEE);  Surgeon: Burnell Blanks, MD;  Location: [place];  Service: Open Heart Surgery;  Laterality: N/A;  . TONSILLECTOMY AND ADENOIDECTOMY    . TRANSCATHETER AORTIC VALVE REPLACEMENT, TRANSFEMORAL N/A 12/08/2016   Procedure: TRANSCATHETER AORTIC VALVE REPLACEMENT, TRANSFEMORAL;  Surgeon: Burnell Blanks, MD;  Location: [place];  Service: Open Heart Surgery;  Laterality: N/A;   Family History  Problem Relation Age of Onset  . Stroke Father   . Cancer Father   . Heart failure Mother   . Heart disease Mother   . Multiple sclerosis Sister   . Lung cancer Brother    Social History   Socioeconomic History  . Marital status: Married    Spouse name: Not on file  . Number of children: 2  . Years of education: Not on file  . Highest education level: Not on file  Occupational History  . Occupation: Insurance    Comment: Printmaker  . Financial resource strain: Not on file  . Food insecurity:    Worry: Not on file    Inability: Not on file  . Transportation needs:    Medical: Not on file    Non-medical: Not on file  Tobacco Use  . Smoking status: Former Smoker    Packs/day: 1.00    Years: 2.00    Pack years: 2.00    Types: Cigarettes    Start date: 08/31/1949    Last attempt to quit: 09/01/1951    Years since quitting: 66.7  . Smokeless tobacco: Never Used  Substance and Sexual Activity  . Alcohol use: No  . Drug use: No  . Sexual activity: Not on file  Lifestyle  . Physical activity:    Days per week: Not on file    Minutes per session: Not on file  . Stress: Not on  file    Relationships  . Social connections:    Talks on phone: Not on file    Gets together: Not on file    Attends religious service: Not on file    Active member of club or organization: Not on file    Attends meetings of clubs or organizations: Not on file    Relationship status: Not on file  Other Topics Concern  . Not on file  Social History Narrative  . Not on file    Outpatient Encounter Medications as of 05/23/2018  Medication Sig  . amoxicillin (AMOXIL) 500 MG capsule TAKE 4 CAPSULES BY MOUTH 60 MINUTES PRIOR TO DENTAL PROCEDURE  . aspirin EC 81 MG tablet Take 81 mg by mouth daily.  [name] losartan (COZAAR) 25 MG tablet Take 0.5 tablets (12.5 mg total) by mouth daily.  . simvastatin (ZOCOR) 10 MG tablet TAKE ONE-HALF TABLET BY  MOUTH AT BEDTIME  . tamsulosin (FLOMAX) 0.4 MG CAPS capsule TAKE 1 CAPSULE BY MOUTH  DAILY   No facility-administered encounter medications on file as of 05/23/2018.     Activities of Daily Living No flowsheet data found.  Patient Care Team: [name], MD as PCP - General (Family Medicine) [name], [name], MD as PCP - Cardiology (Cardiology)   Assessment:   This is a routine wellness examination for [place].  Exercise Activities and Dietary recommendations    Goals    . patient     Maintain health      . Patient Stated     Going to maintain your health        Fall Risk Fall Risk  05/18/2017 07/02/2014 06/16/2013  Falls in the past year? No No No   Falls due to right knee; can give away on him. Fell on snow picking up wood and slipped on bridge to pond behind his home. Another time, he was picking up sticks and  put foot in trash and then lifted it and went backwards    Fall education provided but most of his falls seem to be due to overlooking common safety as not going out in pouring down rain. Not try to stand on one leg and do anything; not going out in the snow.  Does not consider "hitting" the ground a fall as he just gets back  up/ No serious incidents but discuss that he may end up with a fx and this could change his life.    Depression Screen PHQ 2/9 Scores 05/18/2017 07/02/2014 06/16/2013  PHQ - 2 Score 0 0 0   Negative  Cognitive Function MMSE - Mini Mental State Exam 05/18/2017  Not completed: (No Data)     Ad8 score reviewed for issues:  Issues making decisions:  Less interest in hobbies / activities:  Repeats questions, stories (family complaining):  Trouble using ordinary gadgets (microwave, computer, phone):  Forgets the month or year:   Mismanaging finances:   Remembering appts:  Daily problems with thinking and/or memory: Ad8 score is=0  Very good historian. Hearing is bad but states he has had hearing issues since he was a child        Immunization History  Administered Date(s) Administered  . Influenza Split 09/21/2011  . Influenza, High Dose Seasonal PF 06/26/2015, 06/17/2016, 06/17/2017  . Influenza,inj,Quad PF,6+ Mos 06/16/2013, 07/02/2014  . Pneumococcal Conjugate-13 04/28/2016  . Tdap 04/28/2016     Screening Tests Health Maintenance  Topic Date Due  . PNA vac Low Risk Adult (2 of 2 -  PPSV23) 04/28/2017  . INFLUENZA VACCINE  03/31/2018  . TETANUS/TDAP  04/28/2026        Plan:      PCP Notes   Health Maintenance Out of flu vaccines in the clinic today  PSV 23; to discuss; declined last year and this year  Educated regarding the shingrix   Abnormal Screens  Multiple falls but mostly due to his attempting to do things as he did them at 22. States he gets back up easily and has not been injured. Discussed safety first as going out in the snow or rain when he is more likely to slip.   Referrals  none  Patient concerns; None, likes home, still enjoys life   Nurse Concerns; As noted   Next PCP apt 11/22/2017      I have personally reviewed and noted the following in the patient's chart:   . Medical and social history . Use of alcohol, tobacco  or illicit drugs  . Current medications and supplements . Functional ability and status . Nutritional status . Physical activity . Advanced directives . List of other physicians . Hospitalizations, surgeries, and ER visits in previous 12 months . Vitals . Screenings to include cognitive, depression, and falls . Referrals and appointments  In addition, I have reviewed and discussed with patient certain preventive protocols, quality metrics, and best practice recommendations. A written personalized care plan for preventive services as well as general preventive health recommendations were provided to patient.     MBTDH,RCBUL, RN  05/23/2018  I have reviewed the documentation for the AWV and [place] provided by the health coach and agree with their documentation. I was immediately available for any questions  [name] MD [facility]

## 2018-06-01 DIAGNOSIS — M1712 Unilateral primary osteoarthritis, left knee: Secondary | ICD-10-CM | POA: Diagnosis not present

## 2018-06-01 DIAGNOSIS — M1711 Unilateral primary osteoarthritis, right knee: Secondary | ICD-10-CM | POA: Diagnosis not present

## 2018-06-01 DIAGNOSIS — M170 Bilateral primary osteoarthritis of knee: Secondary | ICD-10-CM | POA: Diagnosis not present

## 2018-06-13 ENCOUNTER — Ambulatory Visit (INDEPENDENT_AMBULATORY_CARE_PROVIDER_SITE_OTHER): Payer: Medicare Other | Admitting: *Deleted

## 2018-06-13 DIAGNOSIS — Z23 Encounter for immunization: Secondary | ICD-10-CM

## 2018-07-07 IMAGING — CT CT ANGIO CHEST
1 of 9 series · 1 of 36 positions shown · IV contrast (Iodine)
Comparison: CT pelvis 09/16/2012.

CLINICAL DATA: 89-year-old male with history of aortic stenosis.
Preprocedural study prior to potential transcatheter aortic valve
replacement (TAVR) procedure.

EXAM:
CT ANGIOGRAPHY CHEST, ABDOMEN AND PELVIS
TECHNIQUE: Multidetector CT imaging through the chest, abdomen and pelvis was
performed using the standard protocol during bolus administration of
intravenous contrast. Multiplanar reconstructed images and MIPs were
obtained and reviewed to evaluate the vascular anatomy.
CONTRAST:  100 mL of Isovue 370

[Series 200: locator · axial · 0.59mm/px · 1 of 1 slices shown]
[im 1/1  lung]
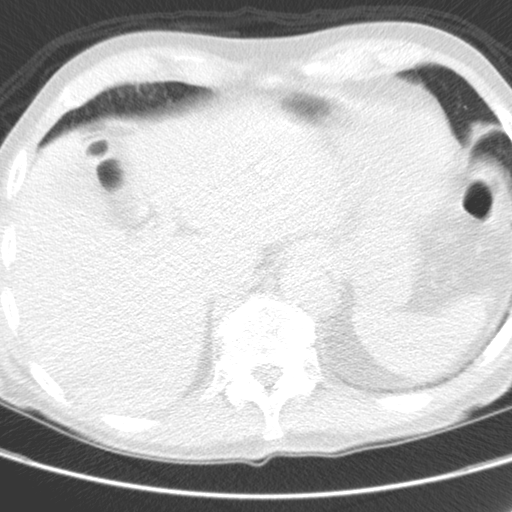

[1 of 36 positions shown; findings below may reference images not displayed]

FINDINGS: CTA CHEST FINDINGS

Cardiovascular: Heart size is normal. There is no significant
pericardial fluid, thickening or pericardial calcification. There is
aortic atherosclerosis, as well as atherosclerosis of the great
vessels of the mediastinum and the coronary arteries, including
calcified atherosclerotic plaque in the left anterior descending
coronary arteries. Severe thickening calcification of the aortic
valve. Mild calcifications of the posterior leaflet of the mitral
valve and mitral annulus.

Mediastinum/Lymph Nodes: No pathologically enlarged mediastinal or
hilar lymph nodes. Esophagus is unremarkable in appearance. No
axillary lymphadenopathy.

Lungs/Pleura: Nodular areas of bilateral apical pleuroparenchymal
thickening are noted, most compatible with chronic post infectious
or inflammatory scarring. A few other scattered 2-4 mm pulmonary
nodules are noted throughout the lungs bilaterally, highly
nonspecific, but statistically likely benign. No larger more
suspicious appearing pulmonary nodules or masses are noted. No acute
consolidative airspace disease. No pleural effusions.

Musculoskeletal/Soft Tissues: There are no aggressive appearing
lytic or blastic lesions noted in the visualized portions of the
skeleton.

CTA ABDOMEN AND PELVIS FINDINGS

Hepatobiliary: There are 2 well-defined low-attenuation lesions in
the liver, largest of which is in the central aspect of segment 5
measuring up to 2.4 cm, compatible with simple cysts. No aggressive
appearing hepatic lesions are noted. No intra or extrahepatic
biliary ductal dilatation. Gallbladder is normal in appearance.

Pancreas: 1.3 x 0.7 cm low-attenuation lesion in the body of the
pancreas is nonspecific (axial image 148 of series 401). No larger
more suspicious appearing pancreatic mass is noted. No pancreatic
ductal dilatation. No peripancreatic fluid or inflammatory changes.

Spleen: Unremarkable.

Adrenals/Urinary Tract: Bilateral kidneys and bilateral adrenal
glands are normal in appearance. No hydroureteronephrosis. Urinary
bladder is normal in appearance.

Stomach/Bowel: Normal appearance of the stomach. No pathologic
dilatation of small bowel or colon. Numerous colonic diverticulae
are noted, particularly in the sigmoid colon, without surrounding
inflammatory changes to suggest an acute diverticulitis at this
time. The appendix is not confidently identified and may be
surgically absent. Regardless, there are no inflammatory changes
noted adjacent to the cecum to suggest the presence of an acute
appendicitis at this time.

Vascular/Lymphatic: Aortic atherosclerosis, with vascular findings
and measurements pertinent to potential TAVR procedure, as detailed
below. No aneurysm or dissection noted in the abdominal or pelvic
vasculature. Celiac axis, superior mesenteric artery and inferior
mesenteric artery are all widely patent without hemodynamically
significant stenosis. Single renal arteries bilaterally are both
widely patent. No lymphadenopathy noted in the abdomen or pelvis.

Reproductive: Fiducial markers in the prostate gland. Seminal
vesicles are unremarkable in appearance.

Other: No significant volume of ascites.  No pneumoperitoneum.

Musculoskeletal: There are no aggressive appearing lytic or blastic
lesions noted in the visualized portions of the skeleton.

VASCULAR MEASUREMENTS PERTINENT TO TAVR:

AORTA:

Minimal Aortic Diameter -  15 x 17 mm

Severity of Aortic Calcification -  mild

RIGHT PELVIS:

Right Common Iliac Artery -

Minimal Diameter - 9.5 x 9.3 mm

Tortuosity - mild

Calcification - mild

Right External Iliac Artery -

Minimal Diameter - 8.1 x 8.1 mm

Tortuosity - moderate

Calcification - none

Right Common Femoral Artery -

Minimal Diameter - 9.0 x 9.2 mm

Tortuosity - mild

Calcification - none

LEFT PELVIS:

Left Common Iliac Artery -

Minimal Diameter - 11.0 x 11.2 mm

Tortuosity - mild

Calcification - none

Left External Iliac Artery -

Minimal Diameter - 8.7 x 8.3 mm

Tortuosity - moderate to severe

Calcification - none

Left Common Femoral Artery -

Minimal Diameter - 9.2 x 9.3 mm

Tortuosity - mild

Calcification - none

Review of the MIP images confirms the above findings.
IMPRESSION: 1. Vascular findings and measurements pertinent to potential TAVR
procedure, as detailed above. Patient does appear to have suitable
pelvic arterial access bilaterally.
2. Severe thickening calcification of the aortic valve, compatible
with the reported clinical history of severe aortic stenosis.
3. Multiple tiny 2-4 mm pulmonary nodules scattered throughout the
lungs bilaterally, nonspecific but statistically likely benign. No
follow-up needed if patient is low-risk (and has no known or
suspected primary neoplasm). Non-contrast chest CT can be considered
in 12 months if patient is high-risk. This recommendation follows
the consensus statement: Guidelines for Management of Incidental
Pulmonary Nodules Detected on CT Images: From the [HOSPITAL]
4. Severe colonic diverticulosis without evidence to suggest an
acute diverticulitis at this time.
5. **An incidental finding of potential clinical significance has
been found. 1.3 x 0.7 cm low-attenuation lesion in the mid body of
the pancreas. This is nonspecific, but warrants attention on
follow-up studies. Repeat pancreatic protocol CT or MRI of the
abdomen with and without IV gadolinium with MRCP is recommended in 2
years to ensure the stability of this lesion. This recommendation
follows ACR consensus guidelines: Management of Incidental
Pancreatic Cysts: A White Paper of the ACR Incidental Findings
Committee. [HOSPITAL] [id number];[DATE].**
6. Additional incidental findings, as above.

## 2018-07-20 ENCOUNTER — Encounter: Payer: Self-pay | Admitting: Family Medicine

## 2018-07-20 ENCOUNTER — Other Ambulatory Visit: Payer: Self-pay

## 2018-07-20 ENCOUNTER — Ambulatory Visit (INDEPENDENT_AMBULATORY_CARE_PROVIDER_SITE_OTHER): Payer: Medicare Other | Admitting: Family Medicine

## 2018-07-20 VITALS — BP 134/74 | HR 72 | Temp 98.0°F | Wt 153.1 lb

## 2018-07-20 DIAGNOSIS — J209 Acute bronchitis, unspecified: Secondary | ICD-10-CM | POA: Diagnosis not present

## 2018-07-20 NOTE — Patient Instructions (Signed)

## 2018-07-20 NOTE — Progress Notes (Signed)
Subjective:     Patient ID: Jonathan Graham, male   DOB: Feb 23, 1928, 84 y.o.   MRN: [id number]  HPI Patient seen with 2-week history of cough.  Nonproductive.  Had some nasal congestion initially which is improving.  Overall feels better.  Denies any wheezing.  No dyspnea.  No fevers or chills.  Wife now has similar symptoms.  Past Medical History:  Diagnosis Date  . Arthritis   . Atypical chest pain    history of   . Back pain   . Cancer [facility]) 2014   prostate  . Cardiovascular stress test abnormal 04/25/2004   Abnormal adenosine Cardiolite study / evidence of inferoseptal ischemia &  an EF of 48% ""recommended that he undergo cardiac catheterization""  . Cervical spine disease   . Dizziness    occasionally apon bending over  . Fatigue    history of   . Heart murmur   . History of hiatal hernia   . History of mononucleosis   . Hypercholesterolemia   . Left bundle branch block   . Pleurisy 07/2005  . S/P radiation therapy  12/05/2012 through 01/30/2013                                                         Prostate 7800 cGy 40 sessions, seminal vesicles 5600 cGy 40 sessions                       . Shortness of breath   . Spondylosis, cervical    at C3-C4  . Varicose veins   . Weakness    history of    Past Surgical History:  Procedure Laterality Date  . BACK SURGERY  1995    2 ruptured discs in lower back / by Dr. Louanne Skye  . CARDIAC CATHETERIZATION  2005   EF estimated at 50% /  PROCEDURE:  Left heart catheterization, coronary and left ventricular angiography /  RAO view demonstrates mild LV enlargement  / mild hypokinesia anteroseptal with  overall low normal LV systolic function / Normal coronary anatomy  . HEMORRHOID SURGERY    . PROSTATE BIOPSY  08/25/2012   Gleason 3+3=6 PSA 7.80  . RIGHT/LEFT HEART CATH AND CORONARY ANGIOGRAPHY N/A 11/13/2016   Procedure: Right/Left Heart Cath and Coronary Angiography;  Surgeon: [name] [name], MD;  Location: [place] [facility];   Service: Cardiovascular;  Laterality: N/A;  . TEE WITHOUT CARDIOVERSION N/A 12/08/2016   Procedure: TRANSESOPHAGEAL ECHOCARDIOGRAM (TEE);  Surgeon: Burnell Blanks, MD;  Location: [place];  Service: Open Heart Surgery;  Laterality: N/A;  . TONSILLECTOMY AND ADENOIDECTOMY    . TRANSCATHETER AORTIC VALVE REPLACEMENT, TRANSFEMORAL N/A 12/08/2016   Procedure: TRANSCATHETER AORTIC VALVE REPLACEMENT, TRANSFEMORAL;  Surgeon: Burnell Blanks, MD;  Location: [place];  Service: Open Heart Surgery;  Laterality: N/A;    reports that he quit smoking about 66 years ago. His smoking use included cigarettes. He started smoking about 68 years ago. He has a 2.00 pack-year smoking history. He has never used smokeless tobacco. He reports that he does not drink alcohol or use drugs. family history includes Cancer in his father; Heart disease in his mother; Heart failure in his mother; Lung cancer in his brother; Multiple sclerosis in his sister; Stroke in his father. Allergies  Allergen  Reactions  . Lipitor [Atorvastatin Calcium] Other (See Comments)    MYALGIAS CRAMPS  . Lisinopril Cough     Review of Systems  Constitutional: Negative for chills and fever.  Respiratory: Positive for cough. Negative for shortness of breath and wheezing.        Objective:   Physical Exam  Constitutional: He appears well-developed and well-nourished.  HENT:  Right Ear: External ear normal.  Left Ear: External ear normal.  Mouth/Throat: Oropharynx is clear and moist.  Neck: Neck supple.  Cardiovascular: Normal rate and regular rhythm.  Pulmonary/Chest: Effort normal and breath sounds normal. He has no wheezes. He has no rales.  Lymphadenopathy:    He has no cervical adenopathy.       Assessment:     Cough.  Suspect acute viral bronchitis.  Nonfocal exam    Plan:     -Observation for now.  He seems to be getting better.  Follow-up promptly for any fever, increased shortness of breath, or other  concerns.  [name] MD [facility] at [facility]

## 2018-08-04 DIAGNOSIS — M1711 Unilateral primary osteoarthritis, right knee: Secondary | ICD-10-CM | POA: Diagnosis not present

## 2018-08-11 DIAGNOSIS — M1711 Unilateral primary osteoarthritis, right knee: Secondary | ICD-10-CM | POA: Diagnosis not present

## 2018-08-18 DIAGNOSIS — M1711 Unilateral primary osteoarthritis, right knee: Secondary | ICD-10-CM | POA: Diagnosis not present

## 2018-08-22 ENCOUNTER — Other Ambulatory Visit: Payer: Self-pay | Admitting: Cardiology

## 2018-09-10 NOTE — Progress Notes (Signed)
Cardiology Office Note   Date:  09/12/2018   ID:  CONO GEBHARD, DOB 1927-11-06, MRN [id number]  PCP:  [name], MD  Cardiologist:  [name] MD  Chief Complaint  Patient presents with  . Follow-up    wants to know what do they need to do since patient has chf  . Congestive Heart Failure      History of Present Illness: Jonathan Graham is a 85 y.o. male who presents for follow up Aortic stenosis, CHF, LBBB, and HLD.    He has a history of known left bundle branch block and a history of hypercholesterolemia.  He has a history of prostate CA treated with radiation therapy. The patient has a history of  aortic stenosis.  When seen earlier this year Echo demonstrated at least moderate AS. EF had dropped from 40-45% to 25-30%. As such a Dobutamine Echo and suggested he had low gradient severe AS. He subsequently underwent TAVR with Dr. Angelena Form on 12/08/16 with # 31 Edwards Sapien valve. He did well with this. Recommended DAPT for 6 months. Repeat Echo 01/08/17 showed normal function of valve but persistently low EF 30-35%. He was seen in June with an episode of orthostatic hypotension. Has not been on any BP meds. Compression hose ordered but he was not able to tolerate since he said they aggravated his large varicosities.   On follow up today he is doing well. He has no chest pain or dyspnea. Wife notes he sleeps more.  Appetite is good. He has bad arthritis in his right knee and had an injection. No dizziness or palpitations.   Past Medical History:  Diagnosis Date  . Arthritis   . Atypical chest pain    history of   . Back pain   . Cancer [facility]) 2014   prostate  . Cardiovascular stress test abnormal 04/25/2004   Abnormal adenosine Cardiolite study / evidence of inferoseptal ischemia &  an EF of 48% ""recommended that he undergo cardiac catheterization""  . Cervical spine disease   . Dizziness    occasionally apon bending over  . Fatigue    history of   .  Heart murmur   . History of hiatal hernia   . History of mononucleosis   . Hypercholesterolemia   . Left bundle branch block   . Pleurisy 07/2005  . S/P radiation therapy  12/05/2012 through 01/30/2013                                                         Prostate 7800 cGy 40 sessions, seminal vesicles 5600 cGy 40 sessions                       . Shortness of breath   . Spondylosis, cervical    at C3-C4  . Varicose veins   . Weakness    history of     Past Surgical History:  Procedure Laterality Date  . BACK SURGERY  1995    2 ruptured discs in lower back / by Dr. Louanne Skye  . CARDIAC CATHETERIZATION  2005   EF estimated at 50% /  PROCEDURE:  Left heart catheterization, coronary and left ventricular angiography /  RAO view demonstrates mild LV enlargement  / mild hypokinesia anteroseptal with  overall low  normal LV systolic function / Normal coronary anatomy  . HEMORRHOID SURGERY    . PROSTATE BIOPSY  08/25/2012   Gleason 3+3=6 PSA 7.80  . RIGHT/LEFT HEART CATH AND CORONARY ANGIOGRAPHY N/A 11/13/2016   Procedure: Right/Left Heart Cath and Coronary Angiography;  Surgeon:  M [name], MD;  Location: [place] [facility];  Service: Cardiovascular;  Laterality: N/A;  . TEE WITHOUT CARDIOVERSION N/A 12/08/2016   Procedure: TRANSESOPHAGEAL ECHOCARDIOGRAM (TEE);  Surgeon: Burnell Blanks, MD;  Location: [place];  Service: Open Heart Surgery;  Laterality: N/A;  . TONSILLECTOMY AND ADENOIDECTOMY    . TRANSCATHETER AORTIC VALVE REPLACEMENT, TRANSFEMORAL N/A 12/08/2016   Procedure: TRANSCATHETER AORTIC VALVE REPLACEMENT, TRANSFEMORAL;  Surgeon: Burnell Blanks, MD;  Location: [facility];  Service: Open Heart Surgery;  Laterality: N/A;     Current Outpatient Medications  Medication Sig Dispense Refill  . amoxicillin (AMOXIL) 500 MG capsule TAKE 4 CAPSULES BY MOUTH 60 MINUTES PRIOR TO DENTAL PROCEDURE 4 capsule 4  . aspirin EC 81 MG tablet Take 81 mg by mouth daily.    [name] losartan  (COZAAR) 25 MG tablet TAKE 1/2 TABLET BY MOUTH DAILY. 45 tablet 1  . simvastatin (ZOCOR) 10 MG tablet TAKE ONE-HALF TABLET BY  MOUTH AT BEDTIME 45 tablet 1  . tamsulosin (FLOMAX) 0.4 MG CAPS capsule TAKE 1 CAPSULE BY MOUTH  DAILY 90 capsule 1   No current facility-administered medications for this visit.     Allergies:   Lipitor [atorvastatin calcium] and Lisinopril    Social History:  The patient  reports that he quit smoking about 67 years ago. His smoking use included cigarettes. He started smoking about 69 years ago. He has a 2.00 pack-year smoking history. He has never used smokeless tobacco. He reports that he does not drink alcohol or use drugs.   Family History:  The patient's family history includes Cancer in his father; Heart disease in his mother; Heart failure in his mother; Lung cancer in his brother; Multiple sclerosis in his sister; Stroke in his father.    ROS:  Please see the history of present illness. Otherwise, review of systems are positive for none.   All other systems are reviewed and negative.    PHYSICAL EXAM: VS:  BP 102/64   Pulse 70   Ht 5' 9.5" (1.765 m)   Wt 151 lb (68.5 kg)   BMI 21.98 kg/m  , BMI Body mass index is 21.98 kg/m. GENERAL:  Well appearing, elderly WM in NAD HEENT:  PERRL, EOMI, sclera are clear. Oropharynx is clear. NECK:  No jugular venous distention, carotid upstroke brisk and symmetric, no bruits, no thyromegaly or adenopathy LUNGS:  Clear to auscultation bilaterally CHEST:  Unremarkable HEART:  RRR,  PMI not displaced or sustained,S1 and S2 within normal limits, no S3, no S4: no clicks, no rubs, soft 1/6 systolic murmur RUSB. ABD:  Soft, nontender. BS +, no masses or bruits. No hepatomegaly, no splenomegaly EXT:  2 + pulses throughout, no edema, no cyanosis no clubbing SKIN:  Warm and dry.  No rashes NEURO:  Alert and oriented x 3. Cranial nerves II through XII intact. PSYCH:  Cognitively intact    EKG:  EKG is not ordered  today.   Recent Labs: No results found for requested labs within last 8760 hours.    Lipid Panel    Component Value Date/Time   CHOL 156 06/03/2017 0832   TRIG 66 06/03/2017 0832   HDL 58 06/03/2017 0832   CHOLHDL 2.7  06/03/2017 0832   CHOLHDL 2.7 09/24/2016 0803   VLDL 11 09/24/2016 0803   LDLCALC 85 06/03/2017 0832      Wt Readings from Last 3 Encounters:  09/12/18 151 lb (68.5 kg)  07/20/18 153 lb 1.6 oz (69.4 kg)  05/23/18 152 lb 9 oz (69.2 kg)     Cardiac cath March 2018: Procedures   Right/Left Heart Cath and Coronary Angiography  Conclusion     Ost 1st Mrg to 1st Mrg lesion, 50 %stenosed.  There is moderate to severe left ventricular systolic dysfunction.  LV end diastolic pressure is normal.  The left ventricular ejection fraction is 25-35% by visual estimate.  LV end diastolic pressure is normal.  There is moderate aortic valve stenosis.   1. Mild nonobstructive CAD 2. Normal right heart pressures 3. Normal LV filling pressures  4. Moderate Aortic stenosis with mean gradient of 26 mm Hg. AV area may be underestimated due to low EF.   Plan: consider AVR/TAVR.     Echo 01/08/17: Study Conclusions  - Left ventricle: The cavity size was normal. Wall thickness was   normal. Systolic function was moderately to severely reduced. The   estimated ejection fraction was in the range of 30% to 35%. Wall   motion was normal; there were no regional wall motion   abnormalities. Doppler parameters are consistent with abnormal   left ventricular relaxation (grade 1 diastolic dysfunction). - Ventricular septum: Septal motion showed abnormal function and   dyssynergy. - Aortic valve: A TAVR bioprosthesis was present and functioning   normally. Mean gradient (S): 12 mm Hg. Peak gradient (S): 26 mm   Hg. - Mitral valve: Calcified annulus. - Left atrium: The atrium was mildly dilated. - Pulmonary arteries: Systolic pressure was moderately increased.   PA peak  pressure: 50 mm Hg (S).  Impressions:  - Compared to the prior study, there has been no significant   interval change.  Echo 12/13/17: Study Conclusions  - Left ventricle: The cavity size was normal. Wall thickness was   normal. Septal-lateral dyssynchrony with diffuse hypokinesis.   Systolic function was moderately to severely reduced. The   estimated ejection fraction was in the range of 30% to 35%.   Doppler parameters are consistent with abnormal left ventricular   relaxation (grade 1 diastolic dysfunction). - Aortic valve: Bioprosthetic aortic valve s/p TAVR. Trivial   peri-valvular regurgitation. No significant stenosis. Mean   gradient (S): 12 mm Hg. Valve area (VTI): 1.93 cm^2. - Mitral valve: Mildly calcified annulus. There was mild   regurgitation. - Left atrium: The atrium was mildly dilated. - Right ventricle: The cavity size was normal. Systolic function   was normal. - Right atrium: The atrium was mildly dilated. - Tricuspid valve: Peak RV-RA gradient (S): 26 mm Hg. - Pulmonary arteries: PA peak pressure: 29 mm Hg (S). - Inferior vena cava: The vessel was normal in size. The   respirophasic diameter changes were in the normal range (>= 50%),   consistent with normal central venous pressure.  Impressions:  - Normal LV size with EF 30-35%. Diffuse hypokinesis with   septal-lateral dyssynchrony. Normal RV size and systolic   function. Bioprosthetic aortic valve s/p TAVR with trivial AI, no   significant stenosis.   ASSESSMENT AND PLAN:  1. Severe  aortic stenosis- s/p TAVR.  Echo in April 2019 showed good valve function.  Continue ASA indefinitely. He is asymptomatic. 2. Hypercholesterolemia- fair control. On Zocor.  3. Prostate cancer treated with radiation therapy 4.  LBBB chronic.  5. Nonischemic cardiomyopathy with EF 30-35%. Currently very low dose of losartan. Unable to titrate meds due to low BP and orthostasis. 6. History of orthostatic  hypotension.  Follow  Up in 6 months.   Current medicines are reviewed at length with the patient today.  The patient does not have concerns regarding medicines.  The following changes have been made: none  Labs/ tests ordered today include:   No orders of the defined types were placed in this encounter.    Signed,  [name] MD, [facility]   09/12/2018 4:17 PM    [facility]

## 2018-09-12 ENCOUNTER — Ambulatory Visit: Payer: Medicare Other | Admitting: Cardiology

## 2018-09-12 ENCOUNTER — Encounter: Payer: Self-pay | Admitting: Cardiology

## 2018-09-12 VITALS — BP 102/64 | HR 70 | Ht 69.5 in | Wt 151.0 lb

## 2018-09-12 DIAGNOSIS — Z952 Presence of prosthetic heart valve: Secondary | ICD-10-CM

## 2018-09-12 DIAGNOSIS — I5022 Chronic systolic (congestive) heart failure: Secondary | ICD-10-CM

## 2018-09-12 DIAGNOSIS — I447 Left bundle-branch block, unspecified: Secondary | ICD-10-CM

## 2018-09-12 DIAGNOSIS — I350 Nonrheumatic aortic (valve) stenosis: Secondary | ICD-10-CM | POA: Diagnosis not present

## 2018-09-12 DIAGNOSIS — I951 Orthostatic hypotension: Secondary | ICD-10-CM | POA: Diagnosis not present

## 2018-09-22 DIAGNOSIS — H04123 Dry eye syndrome of bilateral lacrimal glands: Secondary | ICD-10-CM | POA: Diagnosis not present

## 2018-10-18 DIAGNOSIS — B079 Viral wart, unspecified: Secondary | ICD-10-CM | POA: Diagnosis not present

## 2018-10-18 DIAGNOSIS — D229 Melanocytic nevi, unspecified: Secondary | ICD-10-CM | POA: Diagnosis not present

## 2018-10-18 DIAGNOSIS — L570 Actinic keratosis: Secondary | ICD-10-CM | POA: Diagnosis not present

## 2018-10-18 DIAGNOSIS — D485 Neoplasm of uncertain behavior of skin: Secondary | ICD-10-CM | POA: Diagnosis not present

## 2018-10-18 DIAGNOSIS — L814 Other melanin hyperpigmentation: Secondary | ICD-10-CM | POA: Diagnosis not present

## 2018-10-18 DIAGNOSIS — L821 Other seborrheic keratosis: Secondary | ICD-10-CM | POA: Diagnosis not present

## 2018-10-18 DIAGNOSIS — L819 Disorder of pigmentation, unspecified: Secondary | ICD-10-CM | POA: Diagnosis not present

## 2018-10-27 ENCOUNTER — Other Ambulatory Visit: Payer: Self-pay | Admitting: Cardiology

## 2018-10-27 NOTE — Telephone Encounter (Signed)
Rx(s) sent to pharmacy electronically.  

## 2019-02-19 ENCOUNTER — Other Ambulatory Visit: Payer: Self-pay | Admitting: Cardiology

## 2019-02-20 ENCOUNTER — Telehealth: Payer: Self-pay | Admitting: Cardiology

## 2019-02-20 ENCOUNTER — Other Ambulatory Visit: Payer: Self-pay

## 2019-02-20 DIAGNOSIS — R5383 Other fatigue: Secondary | ICD-10-CM

## 2019-02-20 DIAGNOSIS — R531 Weakness: Secondary | ICD-10-CM

## 2019-02-20 DIAGNOSIS — E7800 Pure hypercholesterolemia, unspecified: Secondary | ICD-10-CM

## 2019-02-20 DIAGNOSIS — I350 Nonrheumatic aortic (valve) stenosis: Secondary | ICD-10-CM

## 2019-02-20 DIAGNOSIS — R9439 Abnormal result of other cardiovascular function study: Secondary | ICD-10-CM

## 2019-02-20 NOTE — Telephone Encounter (Signed)
Returned call to patient's wife she stated she would like husband to have lab work that [place] usually orders.Stated she would like him to B12 level checked too he is tired all the time.Advised he can have lab done this week No appointment needed.Advised nothing to eat after midnight he may have water.Lab orders entered.

## 2019-02-20 NOTE — Telephone Encounter (Signed)
New Message      Pts wife is calling and says the pts labs were last done in 07/2017 and she says he needs to have them done every year    Please call back

## 2019-02-21 DIAGNOSIS — E7800 Pure hypercholesterolemia, unspecified: Secondary | ICD-10-CM | POA: Diagnosis not present

## 2019-02-21 DIAGNOSIS — R531 Weakness: Secondary | ICD-10-CM | POA: Diagnosis not present

## 2019-02-21 DIAGNOSIS — R5383 Other fatigue: Secondary | ICD-10-CM | POA: Diagnosis not present

## 2019-02-21 DIAGNOSIS — I350 Nonrheumatic aortic (valve) stenosis: Secondary | ICD-10-CM | POA: Diagnosis not present

## 2019-02-21 DIAGNOSIS — R9439 Abnormal result of other cardiovascular function study: Secondary | ICD-10-CM | POA: Diagnosis not present

## 2019-02-22 LAB — CBC WITH DIFFERENTIAL/PLATELET
Basophils Absolute: 0.2 10*3/uL (ref 0.0–0.2)
Basos: 3 %
EOS (ABSOLUTE): 0.5 10*3/uL — ABNORMAL HIGH (ref 0.0–0.4)
Eos: 9 %
Hematocrit: 35.2 % — ABNORMAL LOW (ref 37.5–51.0)
Hemoglobin: 11.6 g/dL — ABNORMAL LOW (ref 13.0–17.7)
Immature Grans (Abs): 0 10*3/uL (ref 0.0–0.1)
Immature Granulocytes: 0 %
Lymphocytes Absolute: 0.9 10*3/uL (ref 0.7–3.1)
Lymphs: 18 %
MCH: 29.4 pg (ref 26.6–33.0)
MCHC: 33 g/dL (ref 31.5–35.7)
MCV: 89 fL (ref 79–97)
Monocytes Absolute: 0.5 10*3/uL (ref 0.1–0.9)
Monocytes: 10 %
Neutrophils Absolute: 3.2 10*3/uL (ref 1.4–7.0)
Neutrophils: 60 %
Platelets: 192 10*3/uL (ref 150–450)
RBC: 3.95 x10E6/uL — ABNORMAL LOW (ref 4.14–5.80)
RDW: 14 % (ref 11.6–15.4)
WBC: 5.2 10*3/uL (ref 3.4–10.8)

## 2019-02-22 LAB — BASIC METABOLIC PANEL
BUN/Creatinine Ratio: 19 (ref 10–24)
BUN: 22 mg/dL (ref 8–27)
CO2: 21 mmol/L (ref 20–29)
Calcium: 9.3 mg/dL (ref 8.6–10.2)
Chloride: 102 mmol/L (ref 96–106)
Creatinine, Ser: 1.17 mg/dL (ref 0.76–1.27)
GFR calc Af Amer: 64 mL/min/{1.73_m2} (ref >59)
GFR calc non Af Amer: 55 mL/min/{1.73_m2} — ABNORMAL LOW (ref >59)
Glucose: 87 mg/dL (ref 65–99)
Potassium: 4.9 mmol/L (ref 3.5–5.2)
Sodium: 138 mmol/L (ref 134–144)

## 2019-02-22 LAB — LIPID PANEL
Chol/HDL Ratio: 2.5 ratio (ref 0.0–5.0)
Cholesterol, Total: 168 mg/dL (ref 100–199)
HDL: 66 mg/dL (ref >39)
LDL Calculated: 89 mg/dL (ref 0–99)
Triglycerides: 66 mg/dL (ref 0–149)
VLDL Cholesterol Cal: 13 mg/dL (ref 5–40)

## 2019-02-22 LAB — HEPATIC FUNCTION PANEL
ALT: 8 IU/L (ref 0–44)
AST: 17 IU/L (ref 0–40)
Albumin: 4.5 g/dL (ref 3.6–4.6)
Alkaline Phosphatase: 93 IU/L (ref 39–117)
Bilirubin Total: 0.7 mg/dL (ref 0.0–1.2)
Bilirubin, Direct: 0.19 mg/dL (ref 0.00–0.40)
Total Protein: 6.5 g/dL (ref 6.0–8.5)

## 2019-02-22 LAB — VITAMIN B12: Vitamin B-12: 471 pg/mL (ref 232–1245)

## 2019-03-08 ENCOUNTER — Telehealth: Payer: Self-pay | Admitting: *Deleted

## 2019-03-08 NOTE — Telephone Encounter (Signed)
Doxy appointment made.

## 2019-03-08 NOTE — Telephone Encounter (Signed)
Suggest we set up 30 minute follow up- if possible with both of them (Doxy OK) and we can discuss options.

## 2019-03-08 NOTE — Telephone Encounter (Signed)
Wife came into the office for a B12 injection.  Wife is concerned about the patient's memory loss and would like to know if he should go to neurology for evaluation.  Please advise.

## 2019-03-14 ENCOUNTER — Other Ambulatory Visit: Payer: Self-pay | Admitting: *Deleted

## 2019-03-14 ENCOUNTER — Other Ambulatory Visit: Payer: Self-pay

## 2019-03-14 ENCOUNTER — Ambulatory Visit (INDEPENDENT_AMBULATORY_CARE_PROVIDER_SITE_OTHER): Payer: Medicare Other | Admitting: Family Medicine

## 2019-03-14 ENCOUNTER — Encounter: Payer: Self-pay | Admitting: Family Medicine

## 2019-03-14 VITALS — BP 128/72 | HR 68 | Temp 97.6°F | Ht 69.5 in | Wt 148.8 lb

## 2019-03-14 DIAGNOSIS — R634 Abnormal weight loss: Secondary | ICD-10-CM

## 2019-03-14 DIAGNOSIS — G3184 Mild cognitive impairment, so stated: Secondary | ICD-10-CM

## 2019-03-14 LAB — TSH: TSH: 2.98 u[IU]/mL (ref 0.35–4.50)

## 2019-03-14 MED ORDER — AMOXICILLIN 500 MG PO CAPS: ORAL_CAPSULE | ORAL | 2 refills | Status: DC

## 2019-03-14 NOTE — Progress Notes (Signed)
Subjective:     Patient ID: Jonathan Graham, male   DOB: [birth year], 85 y.o.   MRN: [id number]  HPI Patient is here accompanied by wife with concern for possible memory impairment.  They were supposed to be seen for virtual visit and somehow ended up both here in person.  Neither  have any active symptoms of infection such as cough or fever  Wife states that he has difficulty sometimes remembering things like names.  Only rarely repeats himself.  Patient is aware that his short-term memory is more impaired than his long-term.  He can remember many details regarding things back in the 36s and 40s  He has had some gradual weight loss ever since his aortic valve replacement couple years ago.  He states his appetite is good he is eating fairly regularly.  He had recent B12 level that was normal.  No recent TSH  Past Medical History:  Diagnosis Date  . Arthritis   . Atypical chest pain    history of   . Back pain   . Cancer [facility]) 2014   prostate  . Cardiovascular stress test abnormal 04/25/2004   Abnormal adenosine Cardiolite study / evidence of inferoseptal ischemia &  an EF of 48% ""recommended that he undergo cardiac catheterization""  . Cervical spine disease   . Dizziness    occasionally apon bending over  . Fatigue    history of   . Heart murmur   . History of hiatal hernia   . History of mononucleosis   . Hypercholesterolemia   . Left bundle branch block   . Pleurisy 07/2005  . S/P radiation therapy  12/05/2012 through 01/30/2013                                                         Prostate 7800 cGy 40 sessions, seminal vesicles 5600 cGy 40 sessions                       . Shortness of breath   . Spondylosis, cervical    at C3-C4  . Varicose veins   . Weakness    history of    Past Surgical History:  Procedure Laterality Date  . BACK SURGERY  1995    2 ruptured discs in lower back / by Dr. Louanne Skye  . CARDIAC CATHETERIZATION  2005   EF estimated at 50% /  PROCEDURE:   Left heart catheterization, coronary and left ventricular angiography /  RAO view demonstrates mild LV enlargement  / mild hypokinesia anteroseptal with  overall low normal LV systolic function / Normal coronary anatomy  . HEMORRHOID SURGERY    . PROSTATE BIOPSY  08/25/2012   Gleason 3+3=6 PSA 7.80  . RIGHT/LEFT HEART CATH AND CORONARY ANGIOGRAPHY N/A 11/13/2016   Procedure: Right/Left Heart Cath and Coronary Angiography;  Surgeon: [name] [name], MD;  Location: [place] [facility];  Service: Cardiovascular;  Laterality: N/A;  . TEE WITHOUT CARDIOVERSION N/A 12/08/2016   Procedure: TRANSESOPHAGEAL ECHOCARDIOGRAM (TEE);  Surgeon: Burnell Blanks, MD;  Location: [place];  Service: Open Heart Surgery;  Laterality: N/A;  . TONSILLECTOMY AND ADENOIDECTOMY    . TRANSCATHETER AORTIC VALVE REPLACEMENT, TRANSFEMORAL N/A 12/08/2016   Procedure: TRANSCATHETER AORTIC VALVE REPLACEMENT, TRANSFEMORAL;  Surgeon: Burnell Blanks, MD;  Location: [facility]  OR;  Service: Open Heart Surgery;  Laterality: N/A;    reports that he quit smoking about 67 years ago. His smoking use included cigarettes. He started smoking about 69 years ago. He has a 2.00 pack-year smoking history. He has never used smokeless tobacco. He reports that he does not drink alcohol or use drugs. family history includes Cancer in his father; Heart disease in his mother; Heart failure in his mother; Lung cancer in his brother; Multiple sclerosis in his sister; Stroke in his father. Allergies  Allergen Reactions  . Lipitor [Atorvastatin Calcium] Other (See Comments)    MYALGIAS CRAMPS  . Lisinopril Cough   Wt Readings from Last 3 Encounters:  03/14/19 148 lb 12.8 oz (67.5 kg)  09/12/18 151 lb (68.5 kg)  07/20/18 153 lb 1.6 oz (69.4 kg)     Review of Systems  Constitutional: Positive for unexpected weight change. Negative for appetite change, chills, fatigue and fever.  HENT: Negative for trouble swallowing.   Respiratory: Negative for  cough and shortness of breath.   Cardiovascular: Negative for chest pain.  Gastrointestinal: Negative for abdominal pain.  Genitourinary: Negative for dysuria.  Neurological: Negative for dizziness and syncope.  Hematological: Negative for adenopathy.  Psychiatric/Behavioral: Negative for confusion.       Objective:   Physical Exam Constitutional:      Appearance: Normal appearance.  Cardiovascular:     Rate and Rhythm: Normal rate and regular rhythm.  Pulmonary:     Effort: Pulmonary effort is normal.     Breath sounds: Normal breath sounds.  Musculoskeletal:     Right lower leg: No edema.     Left lower leg: No edema.  Neurological:     Mental Status: He is alert.  Psychiatric:     Comments: MMSE 29/30  Clock drawing did not show a lot of details in terms of numbers.  Also, long and short hand were diagrammed as the same length        Assessment:     #1 mild cognitive impairment.  Patient scored 29/30 on MMSE.  Recent B12 level normal.  #2 mild weight loss.    Plan:     -Check TSH -We discussed potential therapies for cognitive impairment and memory loss including Aricept but at this point we recommended instead 75-month follow-up and repeat cognitive testing then. -We discussed some healthy calorie supplements to try to help with his weight gain and also suggest some muscle building type activities -Consider low-dose mirtazapine if not improved at follow-up -We will plan 20-month follow-up  [name] MD [facility]

## 2019-03-16 DIAGNOSIS — R3912 Poor urinary stream: Secondary | ICD-10-CM | POA: Diagnosis not present

## 2019-03-16 NOTE — Progress Notes (Signed)
Virtual Visit via Telephone Note   This visit type was conducted due to national recommendations for restrictions regarding the COVID-19 Pandemic (e.g. social distancing) in an effort to limit this patient's exposure and mitigate transmission in our community.  Due to his co-morbid illnesses, this patient is at least at moderate risk for complications without adequate follow up.  This format is felt to be most appropriate for this patient at this time.  The patient did not have access to video technology/had technical difficulties with video requiring transitioning to audio format only (telephone).  All issues noted in this document were discussed and addressed.  No physical exam could be performed with this format.  Please refer to the patient's chart for his  consent to telehealth for [facility].   Date:  03/20/2019   ID:  Jonathan Graham, DOB 1927-11-05, MRN [id number]  Patient Location: Home Provider Location: Home  PCP:  [name], MD  Cardiologist:  Gean Laursen [name], MD  Electrophysiologist:  None   Evaluation Performed:  Follow-Up Visit  Chief Complaint:  Follow up AS  History of Present Illness:    Jonathan Graham is a 85 y.o. male with  a history of known left bundle branch block and a history of hypercholesterolemia.  He has a history of prostate CA treated with radiation therapy. The patient has a history of  aortic stenosis.  When seen in 2018  Echo demonstrated at least moderate AS. EF had dropped from 40-45% to 25-30%. As such a Dobutamine Echo and suggested he had low gradient severe AS. He subsequently underwent TAVR with Dr. Angelena Form on 12/08/16 with # 63 Edwards Sapien valve. He did well with this. Recommended DAPT for 6 months. Repeat Echo 01/08/17 showed normal function of valve but persistently low EF 30-35%. He is on very low dose of losartan - limited by orthostasis.  Compression hose ordered but he was not able to tolerate since he said they aggravated his  large varicosities.   On follow up today he notes he has been losing weight. He gets fatigues easily doing any gardening work. Sometimes will have some pain after working. He has lost 7 lbs. Has noted some memory loss recently for which he saw Dr Elease Hashimoto. No edema or palpitations.  The patient does not have symptoms concerning for COVID-19 infection (fever, chills, cough, or new shortness of breath).    Past Medical History:  Diagnosis Date   Arthritis    Atypical chest pain    history of    Back pain    Cancer ([place]) 2014   prostate   Cardiovascular stress test abnormal 04/25/2004   Abnormal adenosine Cardiolite study / evidence of inferoseptal ischemia &  an EF of 48% ""recommended that he undergo cardiac catheterization""   Cervical spine disease    Dizziness    occasionally apon bending over   Fatigue    history of    Heart murmur    History of hiatal hernia    History of mononucleosis    Hypercholesterolemia    Left bundle branch block    Pleurisy 07/2005   S/P radiation therapy  12/05/2012 through 01/30/2013                                                         Prostate  7800 cGy 40 sessions, seminal vesicles 5600 cGy 40 sessions                        Shortness of breath    Spondylosis, cervical    at C3-C4   Varicose veins    Weakness    history of    Past Surgical History:  Procedure Laterality Date   BACK SURGERY  1995    2 ruptured discs in lower back / by Dr. Louanne Skye   CARDIAC CATHETERIZATION  2005   EF estimated at 50% /  PROCEDURE:  Left heart catheterization, coronary and left ventricular angiography /  RAO view demonstrates mild LV enlargement  / mild hypokinesia anteroseptal with  overall low normal LV systolic function / Normal coronary anatomy   HEMORRHOID SURGERY     PROSTATE BIOPSY  08/25/2012   Gleason 3+3=6 PSA 7.80   RIGHT/LEFT HEART CATH AND CORONARY ANGIOGRAPHY N/A 11/13/2016   Procedure: Right/Left Heart Cath and  Coronary Angiography;  Surgeon: Suheyb Raucci M [name], MD;  Location: [facility];  Service: Cardiovascular;  Laterality: N/A;   TEE WITHOUT CARDIOVERSION N/A 12/08/2016   Procedure: TRANSESOPHAGEAL ECHOCARDIOGRAM (TEE);  Surgeon: Burnell Blanks, MD;  Location: [facility];  Service: Open Heart Surgery;  Laterality: N/A;   TONSILLECTOMY AND ADENOIDECTOMY     TRANSCATHETER AORTIC VALVE REPLACEMENT, TRANSFEMORAL N/A 12/08/2016   Procedure: TRANSCATHETER AORTIC VALVE REPLACEMENT, TRANSFEMORAL;  Surgeon: Burnell Blanks, MD;  Location: [facility];  Service: Open Heart Surgery;  Laterality: N/A;     Current Meds  Medication Sig   amoxicillin (AMOXIL) 500 MG capsule TAKE 4 CAPSULES BY MOUTH 60 MINUTES PRIOR TO DENTAL PROCEDURE   aspirin EC 81 MG tablet Take 81 mg by mouth daily.   losartan (COZAAR) 25 MG tablet TAKE 1/2 TABLET BY MOUTH EVERY DAY   simvastatin (ZOCOR) 10 MG tablet Take 0.5 tablets (5 mg total) by mouth at bedtime.   tamsulosin (FLOMAX) 0.4 MG CAPS capsule TAKE 1 CAPSULE BY MOUTH  DAILY     Allergies:   Lipitor [atorvastatin calcium] and Lisinopril   Social History   Tobacco Use   Smoking status: Former Smoker    Packs/day: 1.00    Years: 2.00    Pack years: 2.00    Types: Cigarettes    Start date: 08/31/1949    Quit date: 09/01/1951    Years since quitting: 67.5   Smokeless tobacco: Never Used  Substance Use Topics   Alcohol use: No   Drug use: No     Family Hx: The patient's family history includes Cancer in his father; Heart disease in his mother; Heart failure in his mother; Lung cancer in his brother; Multiple sclerosis in his sister; Stroke in his father.  ROS:   Please see the history of present illness.    All other systems reviewed and are negative.   Prior CV studies:   The following studies were reviewed today:  Echo 12/13/17: Study Conclusions  - Left ventricle: The cavity size was normal. Wall thickness was normal. Septal-lateral  dyssynchrony with diffuse hypokinesis. Systolic function was moderately to severely reduced. The estimated ejection fraction was in the range of 30% to 35%. Doppler parameters are consistent with abnormal left ventricular relaxation (grade 1 diastolic dysfunction). - Aortic valve: Bioprosthetic aortic valve s/p TAVR. Trivial peri-valvular regurgitation. No significant stenosis. Mean gradient (S): 12 mm Hg. Valve area (VTI): 1.93 cm^2. - Mitral valve: Mildly calcified annulus. There was  mild regurgitation. - Left atrium: The atrium was mildly dilated. - Right ventricle: The cavity size was normal. Systolic function was normal. - Right atrium: The atrium was mildly dilated. - Tricuspid valve: Peak RV-RA gradient (S): 26 mm Hg. - Pulmonary arteries: PA peak pressure: 29 mm Hg (S). - Inferior vena cava: The vessel was normal in size. The respirophasic diameter changes were in the normal range (>= 50%), consistent with normal central venous pressure.  Impressions:  - Normal LV size with EF 30-35%. Diffuse hypokinesis with septal-lateral dyssynchrony. Normal RV size and systolic function. Bioprosthetic aortic valve s/p TAVR with trivial AI, no significant stenosis.   Labs/Other Tests and Data Reviewed:    EKG:  No ECG reviewed.  Recent Labs: 02/21/2019: ALT 8; BUN 22; Creatinine, Ser 1.17; Hemoglobin 11.6; Platelets 192; Potassium 4.9; Sodium 138 03/14/2019: TSH 2.98   Recent Lipid Panel Lab Results  Component Value Date/Time   CHOL 168 02/21/2019 08:46 AM   TRIG 66 02/21/2019 08:46 AM   HDL 66 02/21/2019 08:46 AM   CHOLHDL 2.5 02/21/2019 08:46 AM   CHOLHDL 2.7 09/24/2016 08:03 AM   LDLCALC 89 02/21/2019 08:46 AM    Wt Readings from Last 3 Encounters:  03/20/19 144 lb (65.3 kg)  03/14/19 148 lb 12.8 oz (67.5 kg)  09/12/18 151 lb (68.5 kg)     Objective:    Vital Signs:  BP 105/72    Pulse 75    Ht 5\' 9"  (1.753 m)    Wt 144 lb (65.3 kg)     BMI 21.27 kg/m    VITAL SIGNS:  reviewed  ASSESSMENT & PLAN:    1. Severe  aortic stenosis- s/p TAVR.  Echo in April 2019 showed good valve function.  Continue ASA indefinitely.  2. Hypercholesterolemia- good  control. On very low dose Zocor.  3. Prostate cancer treated with radiation therapy. Reports last PSA was good. 4. LBBB chronic.  5. Nonischemic cardiomyopathy with EF 30-35%. Currently very low dose of losartan. Unable to titrate meds due to low BP and orthostasis. Persistent symptoms of fatigue and some dyspnea and chest pain. Will update Echo.  6. History of orthostatic hypotension.  COVID-19 Education: The signs and symptoms of COVID-19 were discussed with the patient and how to seek care for testing (follow up with PCP or arrange E-visit).  The importance of social distancing was discussed today.  Time:   Today, I have spent 15 minutes with the patient with telehealth technology discussing the above problems.     Medication Adjustments/Labs and Tests Ordered: Current medicines are reviewed at length with the patient today.  Concerns regarding medicines are outlined above.   Tests Ordered: No orders of the defined types were placed in this encounter.   Medication Changes: No orders of the defined types were placed in this encounter.   Follow Up:  In Person in 6 month(s)  Signed, Alessander Sikorski [name], MD  03/20/2019 8:23 AM    [facility]

## 2019-03-17 ENCOUNTER — Telehealth: Payer: Self-pay | Admitting: Cardiology

## 2019-03-17 NOTE — Telephone Encounter (Signed)
I called pt to confirm his appt for 03-20-19 with Dr [name].       Virtual Visit Pre-Appointment Phone Call  "(Name), I am calling you today to discuss your upcoming appointment. We are currently trying to limit exposure to the virus that causes COVID-19 by seeing patients at home rather than in the office."  1. Confirm consent - "In the setting of the current Covid19 crisis, you are scheduled for a (phone or video) visit with your provider on (date) at (time).  Just as we do with many in-office visits, in order for you to participate in this visit, we must obtain consent.  If you'd like, I can send this to your mychart (if signed up) or email for you to review.  Otherwise, I can obtain your verbal consent now.  All virtual visits are billed to your insurance company just like a normal visit would be.  By agreeing to a virtual visit, we'd like you to understand that the technology does not allow for your provider to perform an examination, and thus may limit your provider's ability to fully assess your condition. If your provider identifies any concerns that need to be evaluated in person, we will make arrangements to do so.  Finally, though the technology is pretty good, we cannot assure that it will always work on either your or our end, and in the setting of a video visit, we may have to convert it to a phone-only visit.  In either situation, we cannot ensure that we have a secure connection.  Are you willing to proceed?" STAFF: Did the patient verbally acknowledge consent to telehealth visit? Document YES/NO here: Yes  FULL LENGTH CONSENT FOR TELE-HEALTH VISIT   I hereby voluntarily request, consent and authorize [facility] and its employed or contracted physicians, physician assistants, nurse practitioners or other licensed health care professionals (the Practitioner), to provide me with telemedicine health care services (the Services") as deemed necessary by the treating Practitioner. I  acknowledge and consent to receive the Services by the Practitioner via telemedicine. I understand that the telemedicine visit will involve communicating with the Practitioner through live audiovisual communication technology and the disclosure of certain medical information by electronic transmission. I acknowledge that I have been given the opportunity to request an in-person assessment or other available alternative prior to the telemedicine visit and am voluntarily participating in the telemedicine visit.  I understand that I have the right to withhold or withdraw my consent to the use of telemedicine in the course of my care at any time, without affecting my right to future care or treatment, and that the Practitioner or I may terminate the telemedicine visit at any time. I understand that I have the right to inspect all information obtained and/or recorded in the course of the telemedicine visit and may receive copies of available information for a reasonable fee.  I understand that some of the potential risks of receiving the Services via telemedicine include:   Delay or interruption in medical evaluation due to technological equipment failure or disruption;  Information transmitted may not be sufficient (e.g. poor resolution of images) to allow for appropriate medical decision making by the Practitioner; and/or   In rare instances, security protocols could fail, causing a breach of personal health information.  Furthermore, I acknowledge that it is my responsibility to provide information about my medical history, conditions and care that is complete and accurate to the best of my ability. I acknowledge that Practitioner's advice, recommendations, and/or  decision may be based on factors not within their control, such as incomplete or inaccurate data provided by me or distortions of diagnostic images or specimens that may result from electronic transmissions. I understand that the practice of  medicine is not an exact science and that Practitioner makes no warranties or guarantees regarding treatment outcomes. I acknowledge that I will receive a copy of this consent concurrently upon execution via email to the email address I last provided but may also request a printed copy by calling the office of [facility].    I understand that my insurance will be billed for this visit.   I have read or had this consent read to me.  I understand the contents of this consent, which adequately explains the benefits and risks of the Services being provided via telemedicine.   I have been provided ample opportunity to ask questions regarding this consent and the Services and have had my questions answered to my satisfaction.  I give my informed consent for the services to be provided through the use of telemedicine in my medical care  By participating in this telemedicine visit I agree to the above.

## 2019-03-20 ENCOUNTER — Encounter: Payer: Self-pay | Admitting: Cardiology

## 2019-03-20 ENCOUNTER — Telehealth (INDEPENDENT_AMBULATORY_CARE_PROVIDER_SITE_OTHER): Payer: Medicare Other | Admitting: Cardiology

## 2019-03-20 VITALS — BP 105/72 | HR 75 | Ht 69.0 in | Wt 144.0 lb

## 2019-03-20 DIAGNOSIS — I5022 Chronic systolic (congestive) heart failure: Secondary | ICD-10-CM

## 2019-03-20 DIAGNOSIS — I350 Nonrheumatic aortic (valve) stenosis: Secondary | ICD-10-CM

## 2019-03-20 DIAGNOSIS — I447 Left bundle-branch block, unspecified: Secondary | ICD-10-CM

## 2019-03-20 DIAGNOSIS — E7800 Pure hypercholesterolemia, unspecified: Secondary | ICD-10-CM | POA: Diagnosis not present

## 2019-03-20 DIAGNOSIS — R5383 Other fatigue: Secondary | ICD-10-CM

## 2019-03-20 DIAGNOSIS — Z952 Presence of prosthetic heart valve: Secondary | ICD-10-CM

## 2019-03-20 NOTE — Patient Instructions (Addendum)
We will schedule you for an Echocardiogram  Continue your current medication.  I will see you in 6 months     Call 3 months before to schedule

## 2019-03-20 NOTE — Addendum Note (Signed)
Addended by: Kathyrn Lass on: 03/20/2019 08:37 AM   Modules accepted: Orders

## 2019-03-27 ENCOUNTER — Other Ambulatory Visit: Payer: Self-pay

## 2019-03-27 ENCOUNTER — Ambulatory Visit (HOSPITAL_COMMUNITY): Payer: Medicare Other | Attending: Cardiology

## 2019-03-27 DIAGNOSIS — I447 Left bundle-branch block, unspecified: Secondary | ICD-10-CM

## 2019-03-27 DIAGNOSIS — I5022 Chronic systolic (congestive) heart failure: Secondary | ICD-10-CM

## 2019-03-27 DIAGNOSIS — E7800 Pure hypercholesterolemia, unspecified: Secondary | ICD-10-CM | POA: Diagnosis not present

## 2019-03-27 DIAGNOSIS — I350 Nonrheumatic aortic (valve) stenosis: Secondary | ICD-10-CM

## 2019-03-27 DIAGNOSIS — R5383 Other fatigue: Secondary | ICD-10-CM

## 2019-03-27 DIAGNOSIS — Z952 Presence of prosthetic heart valve: Secondary | ICD-10-CM | POA: Diagnosis not present

## 2019-04-14 DIAGNOSIS — M1711 Unilateral primary osteoarthritis, right knee: Secondary | ICD-10-CM | POA: Diagnosis not present

## 2019-04-21 DIAGNOSIS — M1711 Unilateral primary osteoarthritis, right knee: Secondary | ICD-10-CM | POA: Diagnosis not present

## 2019-04-28 DIAGNOSIS — M1711 Unilateral primary osteoarthritis, right knee: Secondary | ICD-10-CM | POA: Diagnosis not present

## 2019-06-27 ENCOUNTER — Ambulatory Visit (INDEPENDENT_AMBULATORY_CARE_PROVIDER_SITE_OTHER): Payer: Medicare Other | Admitting: *Deleted

## 2019-06-27 ENCOUNTER — Telehealth: Payer: Self-pay | Admitting: *Deleted

## 2019-06-28 DIAGNOSIS — Z23 Encounter for immunization: Secondary | ICD-10-CM | POA: Diagnosis not present

## 2019-06-28 NOTE — Telephone Encounter (Signed)
Error

## 2019-06-28 NOTE — Progress Notes (Signed)
Flu Shot given to pt 06/27/2019 Lot# [id number] exp 01/11/2019

## 2019-08-18 ENCOUNTER — Other Ambulatory Visit: Payer: Self-pay | Admitting: Cardiology

## 2019-08-21 NOTE — Telephone Encounter (Signed)
Rx(s) sent to pharmacy electronically.  

## 2019-08-26 ENCOUNTER — Other Ambulatory Visit: Payer: Self-pay | Admitting: Cardiology

## 2019-08-28 ENCOUNTER — Ambulatory Visit: Payer: Self-pay

## 2019-08-28 ENCOUNTER — Telehealth: Payer: Self-pay | Admitting: Cardiology

## 2019-08-28 NOTE — Telephone Encounter (Signed)
Reason for visit noted.  Kerin Ransom PA-C 08/28/2019 1:41 PM

## 2019-08-28 NOTE — Telephone Encounter (Signed)
Spoke to pt wife about pt. She stated here in the past month he has become more SOB and fatigued. States that he is SOB at rest and has been sleeping most of the day. Asked pt wife about other symptoms like cough, fever, loss of taste/smell. States he has been coughing. Asked if they have been in contact with anyone who has been diagnosed with COVID. Stated that their children had it but have not been in close-contact. Stated that they go out to eat occasionally. Advised pt wife to go get them both tested for COVID. Advised to call PCP for testing instructions. Verbalized understanding. Made an appt with Kerin Ransom on 12/31 if test comes back negative. Advised pt wife to cancel appt if positive and to reschedule. Verbalized understanding.

## 2019-08-28 NOTE — Telephone Encounter (Signed)
We could set up Doxy- but agree with advice to consider urgent care for Covid testing.

## 2019-08-28 NOTE — Telephone Encounter (Signed)
  Wife is calling because patient is getting weaker, fatigued, sleeping most of the day, he also gets out of breath upon exertion. She states he started sleeping this much about a week ago but feels it is worse over the last three days. She is concerned and would like to speak to the nurse.

## 2019-08-28 NOTE — Telephone Encounter (Signed)
Incoming call from Patient wife.  Patient gave permission for wife to talk on his behalf.  Wife states that the cardiologist and regular Dr. Are working together.   PCP request Patient be tested for covid-19.  Sates the SOB comes and goes.   Rated moderate.  Has a history of  Congestive heart failure.  Patient wife is calling office back to verify.  Recommend go to Urgent care. For testing of covid-19      Reason for Disposition . [1] Periods where breathing stops and then resumes normally AND [2] bedridden (e.g., nursing home patient, CVA)  Answer Assessment - Initial Assessment Questions 1. RESPIRATORY STATUS: "Describe your breathing?" (e.g., wheezing, shortness of breath, unable to speak, severe coughing)    SOB 2. ONSET: "When did this breathing problem begin?"      Last week 3. PATTERN "Does the difficult breathing come and go, or has it been constant since it started?"      Comes ands go 4. SEVERITY: "How bad is your breathing?" (e.g., mild, moderate, severe)    - MILD: No SOB at rest, mild SOB with walking, speaks normally in sentences, can lay down, no retractions, pulse < 100.    - MODERATE: SOB at rest, SOB with minimal exertion and prefers to sit, cannot lie down flat, speaks in phrases, mild retractions, audible wheezing, pulse 100-120.    - SEVERE: Very SOB at rest, speaks in single words, struggling to breathe, sitting hunched forward, retractions, pulse > 120      moderate 5. RECURRENT SYMPTOM: "Have you had difficulty breathing before?" If so, ask: "When was the last time?" and "What happened that time?"      yes 6. CARDIAC HISTORY: "Do you have any history of heart disease?" (e.g., heart attack, angina, bypass surgery, angioplasty)      Congestive heart failure 7. LUNG HISTORY: "Do you have any history of lung disease?"  (e.g., pulmonary embolus, asthma, emphysema)     denies 8. CAUSE: "What do you think is causing the breathing problem?"      *No Answer* 9. OTHER  SYMPTOMS: "Do you have any other symptoms? (e.g., dizziness, runny nose, cough, chest pain, fever) denies 10. PREGNANCY: "Is there any chance you are pregnant?" "When was your last menstrual period?"       Na 11. TRAVEL: "Have you traveled out of the country in the last month?" (e.g., travel history, exposures)       *No Answer*  Protocols used: BREATHING DIFFICULTY-A-AH

## 2019-08-28 NOTE — Telephone Encounter (Signed)
Agree with advice.  I feel that we probably do need to rule out Covid and if his symptoms are more acute and we cannot wait for Covid results they have option of ER.

## 2019-08-28 NOTE — Telephone Encounter (Signed)
Please see message. °

## 2019-08-28 NOTE — Telephone Encounter (Signed)
Called wife and she took patients O2 at 96 and pulse was 89. They scheduled a Covid test on Wednesday but I recommended to go to Urgent Care. Wife stated that patient is sleeping all of the time now and loosing weight. They did have their children come home for the holidays.  Please advise.

## 2019-08-29 ENCOUNTER — Other Ambulatory Visit: Payer: Medicare Other

## 2019-08-29 NOTE — Telephone Encounter (Signed)
Called patient and spoke with his wife Jonathan Graham and she stated that patient is coughing and has some congestion and is sleeping a lot more and has some SOB. I gave wife message from Dr. Elease Hashimoto. Wife verbalized an understanding and stated that they are going to [facility] [place] tomorrow for a rapid covid test and has an appointment with his heart doctor on Thursday of this week.

## 2019-08-30 ENCOUNTER — Telehealth: Payer: Self-pay | Admitting: *Deleted

## 2019-08-30 DIAGNOSIS — R05 Cough: Secondary | ICD-10-CM | POA: Diagnosis not present

## 2019-08-30 NOTE — Telephone Encounter (Signed)
Copied from [place] [id number]. Topic: General - Inquiry >> Aug 30, 2019  4:23 PM Percell Belt A wrote: Reason for CRM: pt wife called in and wanted Jonathan Graham to know that pt rec covid test yesterday and it was Negative so he is going to see Dr Peter [name] tomorrow.  Just fyi

## 2019-08-30 NOTE — Telephone Encounter (Signed)
Sending as FYI 

## 2019-08-30 NOTE — Telephone Encounter (Signed)
Wife called and states patient's COVID test came back negative. Wife did not get tested herself. Wife states Husband will be at his appt

## 2019-08-31 ENCOUNTER — Other Ambulatory Visit: Payer: Self-pay

## 2019-08-31 ENCOUNTER — Encounter: Payer: Self-pay | Admitting: Cardiology

## 2019-08-31 ENCOUNTER — Ambulatory Visit (INDEPENDENT_AMBULATORY_CARE_PROVIDER_SITE_OTHER): Payer: Medicare Other | Admitting: Cardiology

## 2019-08-31 VITALS — BP 82/52 | HR 96 | Temp 97.0°F | Ht 69.0 in | Wt 135.8 lb

## 2019-08-31 DIAGNOSIS — I951 Orthostatic hypotension: Secondary | ICD-10-CM | POA: Diagnosis not present

## 2019-08-31 DIAGNOSIS — R5383 Other fatigue: Secondary | ICD-10-CM

## 2019-08-31 DIAGNOSIS — R634 Abnormal weight loss: Secondary | ICD-10-CM | POA: Diagnosis not present

## 2019-08-31 DIAGNOSIS — I428 Other cardiomyopathies: Secondary | ICD-10-CM

## 2019-08-31 DIAGNOSIS — Z952 Presence of prosthetic heart valve: Secondary | ICD-10-CM

## 2019-08-31 DIAGNOSIS — I447 Left bundle-branch block, unspecified: Secondary | ICD-10-CM

## 2019-08-31 DIAGNOSIS — R0602 Shortness of breath: Secondary | ICD-10-CM | POA: Diagnosis not present

## 2019-08-31 MED ORDER — TAMSULOSIN HCL 0.4 MG PO CAPS: 0.4000 mg | ORAL_CAPSULE | Freq: Every day | ORAL | 1 refills | Status: AC

## 2019-08-31 NOTE — Assessment & Plan Note (Addendum)
His weight has dropped 15 lbs since Jan 2020-his BMI is 20

## 2019-08-31 NOTE — Assessment & Plan Note (Signed)
Chronic. 

## 2019-08-31 NOTE — Assessment & Plan Note (Signed)
Increasing fatigue

## 2019-08-31 NOTE — Patient Instructions (Addendum)
Medication Instructions:  Stop taking Losartan. Start taking your Flomax (Tamsulosin) at bedtime.   *If you need a refill on your cardiac medications before your next appointment, please call your pharmacy*  Lab Work:Labs Drawn today CBC. BMP  If you have labs (blood work) drawn today and your tests are completely normal, you will receive your results only by: [name] MyChart Message (if you have MyChart) OR . A paper copy in the mail If you have any lab test that is abnormal or we need to change your treatment, we will call you to review the results.  Testing/Procedures: None   Follow-Up: At [facility], you and your health needs are our priority.  As part of our continuing mission to provide you with exceptional heart care, we have created designated Provider Care Teams.  These Care Teams include your primary Cardiologist (physician) and Advanced Practice Providers (APPs -  Physician Assistants and Nurse Practitioners) who all work together to provide you with the care you need, when you need it.  Your next appointment:   2 month(s)  The format for your next appointment:   Either In Person or Virtual  Provider:   Peter [name], MD  Other Instructions Follow up with Dr. Elease Hashimoto.

## 2019-08-31 NOTE — Assessment & Plan Note (Signed)
EF 25% at cath March 2018- EF improved to 30-35% by echo July 2020

## 2019-08-31 NOTE — Progress Notes (Signed)
Cardiology Office Note:    Date:  08/31/2019   ID:  Jonathan Graham, DOB [birth year], MRN [id number]  PCP:  [name], MD  Cardiologist:  Peter [name], MD  Electrophysiologist:  None   Referring MD: [name], MD   Chief Complaint  Patient presents with  . Shortness of Breath  weakness, hypotension, weight loss  History of Present Illness:    Jonathan Graham is a 85 y.o. male with a hx of nonischemic cardiomyopathy, his ejection fraction was 25% in March 2018 at catheterization.  He had severe AS.  He underwent TAVR April 2018.  His ejection fraction improved slightly to 30 to 35%, this has remained the same at his last echo in July 2020.  He has had issues with hypotension.  He has been unable to tolerate compression stockings.  He is noted to have increasing fatigue over the last several months.  This was mentioned to Dr. Doug Sou note in July.  Patient is seen in the office today for evaluation of increasing fatigue hypotension and weight loss.  His wife accompanied him, the patient is hard of hearing.  She reports that his symptoms have worsened over the last couple weeks.  In reviewing his weights he has lost 15 pounds since January 2020.  He says he is fatigued with any exertion.  She tells me she had to call her son and daughter over to help get him in the car.  They reported his heart rate was 140 then, in the office his heart rate is 95.  He does not appear to be in any acute distress.  He is thin.  His blood pressure by me in the office was 80 systolic.  The patient says he is not been eating, he has no appetite.  Past Medical History:  Diagnosis Date  . Arthritis   . Atypical chest pain    history of   . Back pain   . Cancer [facility]) 2014   prostate  . Cardiovascular stress test abnormal 04/25/2004   Abnormal adenosine Cardiolite study / evidence of inferoseptal ischemia &  an EF of 48% ""recommended that he undergo cardiac catheterization""  . Cervical  spine disease   . Dizziness    occasionally apon bending over  . Fatigue    history of   . Heart murmur   . History of hiatal hernia   . History of mononucleosis   . Hypercholesterolemia   . Left bundle branch block   . Pleurisy 07/2005  . S/P radiation therapy  12/05/2012 through 01/30/2013                                                         Prostate 7800 cGy 40 sessions, seminal vesicles 5600 cGy 40 sessions                       . Shortness of breath   . Spondylosis, cervical    at C3-C4  . Varicose veins   . Weakness    history of     Past Surgical History:  Procedure Laterality Date  . BACK SURGERY  1995    2 ruptured discs in lower back / by Dr. Louanne Skye  . CARDIAC CATHETERIZATION  2005   EF estimated at 50% /  PROCEDURE:  Left heart catheterization, coronary and left ventricular angiography /  RAO view demonstrates mild LV enlargement  / mild hypokinesia anteroseptal with  overall low normal LV systolic function / Normal coronary anatomy  . HEMORRHOID SURGERY    . PROSTATE BIOPSY  08/25/2012   Gleason 3+3=6 PSA 7.80  . RIGHT/LEFT HEART CATH AND CORONARY ANGIOGRAPHY N/A 11/13/2016   Procedure: Right/Left Heart Cath and Coronary Angiography;  Surgeon: [name] [name], MD;  Location: [place] [facility];  Service: Cardiovascular;  Laterality: N/A;  . TEE WITHOUT CARDIOVERSION N/A 12/08/2016   Procedure: TRANSESOPHAGEAL ECHOCARDIOGRAM (TEE);  Surgeon: Burnell Blanks, MD;  Location: [place];  Service: Open Heart Surgery;  Laterality: N/A;  . TONSILLECTOMY AND ADENOIDECTOMY    . TRANSCATHETER AORTIC VALVE REPLACEMENT, TRANSFEMORAL N/A 12/08/2016   Procedure: TRANSCATHETER AORTIC VALVE REPLACEMENT, TRANSFEMORAL;  Surgeon: Burnell Blanks, MD;  Location: [place];  Service: Open Heart Surgery;  Laterality: N/A;    Current Medications: Current Meds  Medication Sig  . amoxicillin (AMOXIL) 500 MG capsule TAKE 4 CAPSULES BY MOUTH 60 MINUTES PRIOR TO DENTAL PROCEDURE  .  aspirin EC 81 MG tablet Take 81 mg by mouth daily.  . simvastatin (ZOCOR) 10 MG tablet TAKE ONE-HALF TABLET BY  MOUTH AT BEDTIME  . tamsulosin (FLOMAX) 0.4 MG CAPS capsule Take 1 capsule (0.4 mg total) by mouth at bedtime.  . [DISCONTINUED] losartan (COZAAR) 25 MG tablet Take 0.5 tablets (12.5 mg total) by mouth daily.  . [DISCONTINUED] tamsulosin (FLOMAX) 0.4 MG CAPS capsule TAKE 1 CAPSULE BY MOUTH  DAILY     Allergies:   Lipitor [atorvastatin calcium] and Lisinopril   Social History   Socioeconomic History  . Marital status: Married    Spouse name: Not on file  . Number of children: 2  . Years of education: Not on file  . Highest education level: Not on file  Occupational History  . Occupation: Insurance    Comment: Financial planner  Tobacco Use  . Smoking status: Former Smoker    Packs/day: 1.00    Years: 2.00    Pack years: 2.00    Types: Cigarettes    Start date: 08/31/1949    Quit date: 09/01/1951    Years since quitting: 68.0  . Smokeless tobacco: Never Used  Substance and Sexual Activity  . Alcohol use: No  . Drug use: No  . Sexual activity: Not on file  Other Topics Concern  . Not on file  Social History Narrative  . Not on file   Social Determinants of Health   Financial Resource Strain:   . Difficulty of Paying Living Expenses: Not on file  Food Insecurity:   . Worried About Charity fundraiser in the Last Year: Not on file  . Ran Out of Food in the Last Year: Not on file  Transportation Needs:   . Lack of Transportation (Medical): Not on file  . Lack of Transportation (Non-Medical): Not on file  Physical Activity:   . Days of Exercise per Week: Not on file  . Minutes of Exercise per Session: Not on file  Stress:   . Feeling of Stress : Not on file  Social Connections:   . Frequency of Communication with Friends and Family: Not on file  . Frequency of Social Gatherings with Friends and Family: Not on file  . Attends Religious Services: Not on file  .  Active Member of Clubs or Organizations: Not on file  . Attends Club or  Organization Meetings: Not on file  . Marital Status: Not on file     Family History: The patient's family history includes Cancer in his father; Heart disease in his mother; Heart failure in his mother; Lung cancer in his brother; Multiple sclerosis in his sister; Stroke in his father.  ROS:   Please see the history of present illness.   COVID negative on recent test  All other systems reviewed and are negative.  EKGs/Labs/Other Studies Reviewed:    The following studies were reviewed today: Echo July 2020  EKG:  EKG is ordered today.  The ekg ordered today demonstrates NSR, LBBB, PVC  Recent Labs: 02/21/2019: ALT 8; BUN 22; Creatinine, Ser 1.17; Hemoglobin 11.6; Platelets 192; Potassium 4.9; Sodium 138 03/14/2019: TSH 2.98  Recent Lipid Panel    Component Value Date/Time   CHOL 168 02/21/2019 0846   TRIG 66 02/21/2019 0846   HDL 66 02/21/2019 0846   CHOLHDL 2.5 02/21/2019 0846   CHOLHDL 2.7 09/24/2016 0803   VLDL 11 09/24/2016 0803   LDLCALC 89 02/21/2019 0846    Physical Exam:    VS:  BP (!) 82/52   Pulse 96   Temp (!) 97 F (36.1 C)   Ht 5\' 9"  (1.753 m)   Wt 135 lb 12.8 oz (61.6 kg)   SpO2 96%   BMI 20.05 kg/m     Wt Readings from Last 3 Encounters:  08/31/19 135 lb 12.8 oz (61.6 kg)  03/20/19 144 lb (65.3 kg)  03/14/19 148 lb 12.8 oz (67.5 kg)     GEN: Thin elderly caucasian male,well developed in no acute distress HEENT: Normal, HOH NECK: No JVD; No carotid bruits CARDIAC: RRR, soft systolic murmur, no rubs, gallops RESPIRATORY:  Faint basilar crackles ABDOMEN: Soft, non-tender, non-distended MUSCULOSKELETAL:  No edema; No deformity  SKIN: Warm and dry NEUROLOGIC:  Alert and oriented x 3 PSYCHIATRIC:  Normal affect   ASSESSMENT:    Orthostatic hypotension B/P by me 80 systolic sitting  Fatigue Increasing fatigue  Weight loss His weight has dropped 15 lbs since Jan  2020-his BMI is 20  NICM (nonischemic cardiomyopathy) ([place]) EF 25% at cath March 2018- EF improved to 30-35% by echo July 2020  History of transcatheter aortic valve replacement (TAVR) TAVR April 2018  Left bundle branch block Chronic   PLAN:    Check CBC and BMP.  Stop losartan 12.5 mg and take Flomax Q HS.  I suggested Ensure BID and hydration at home.  His wife will arrange f/u with Dr Elease Hashimoto.     Medication Adjustments/Labs and Tests Ordered: Current medicines are reviewed at length with the patient today.  Concerns regarding medicines are outlined above.  Orders Placed This Encounter  Procedures  . CBC  . Basic Metabolic Panel (BMET)  . EKG 12-Lead   Meds ordered this encounter  Medications  . tamsulosin (FLOMAX) 0.4 MG CAPS capsule    Sig: Take 1 capsule (0.4 mg total) by mouth at bedtime.    Dispense:  90 capsule    Refill:  1    There are no Patient Instructions on file for this visit.   Signed, Kerin Ransom, PA-C  08/31/2019 12:16 PM    [facility]

## 2019-08-31 NOTE — Assessment & Plan Note (Signed)
TAVR April 2018

## 2019-08-31 NOTE — Assessment & Plan Note (Signed)
B/P by me 80 systolic sitting

## 2019-09-01 LAB — CBC
Hematocrit: 35.6 % — ABNORMAL LOW (ref 37.5–51.0)
Hemoglobin: 11.7 g/dL — ABNORMAL LOW (ref 13.0–17.7)
MCH: 28.5 pg (ref 26.6–33.0)
MCHC: 32.9 g/dL (ref 31.5–35.7)
MCV: 87 fL (ref 79–97)
Platelets: 527 10*3/uL — ABNORMAL HIGH (ref 150–450)
RBC: 4.1 x10E6/uL — ABNORMAL LOW (ref 4.14–5.80)
RDW: 13.4 % (ref 11.6–15.4)
WBC: 17.2 10*3/uL — ABNORMAL HIGH (ref 3.4–10.8)

## 2019-09-01 LAB — BASIC METABOLIC PANEL
BUN/Creatinine Ratio: 20 (ref 10–24)
BUN: 24 mg/dL (ref 8–27)
CO2: 21 mmol/L (ref 20–29)
Calcium: 8.7 mg/dL (ref 8.6–10.2)
Chloride: 96 mmol/L (ref 96–106)
Creatinine, Ser: 1.21 mg/dL (ref 0.76–1.27)
GFR calc Af Amer: 61 mL/min/{1.73_m2} (ref >59)
GFR calc non Af Amer: 53 mL/min/{1.73_m2} — ABNORMAL LOW (ref >59)
Glucose: 111 mg/dL — ABNORMAL HIGH (ref 65–99)
Potassium: 4.7 mmol/L (ref 3.5–5.2)
Sodium: 133 mmol/L — ABNORMAL LOW (ref 134–144)

## 2019-09-03 ENCOUNTER — Encounter: Payer: Self-pay | Admitting: Family Medicine

## 2019-09-04 ENCOUNTER — Telehealth (INDEPENDENT_AMBULATORY_CARE_PROVIDER_SITE_OTHER): Payer: Medicare Other | Admitting: Family Medicine

## 2019-09-04 ENCOUNTER — Other Ambulatory Visit: Payer: Self-pay

## 2019-09-04 ENCOUNTER — Telehealth: Payer: Self-pay | Admitting: Family Medicine

## 2019-09-04 DIAGNOSIS — I5022 Chronic systolic (congestive) heart failure: Secondary | ICD-10-CM

## 2019-09-04 DIAGNOSIS — I428 Other cardiomyopathies: Secondary | ICD-10-CM

## 2019-09-04 DIAGNOSIS — R531 Weakness: Secondary | ICD-10-CM

## 2019-09-04 DIAGNOSIS — R627 Adult failure to thrive: Secondary | ICD-10-CM | POA: Diagnosis not present

## 2019-09-04 DIAGNOSIS — R634 Abnormal weight loss: Secondary | ICD-10-CM | POA: Diagnosis not present

## 2019-09-04 MED ORDER — CEFTIN 250 MG/5ML PO SUSR: 250.0000 mg | Freq: Two times a day (BID) | ORAL | 0 refills | Status: DC

## 2019-09-04 NOTE — Progress Notes (Signed)
This visit type was conducted due to national recommendations for restrictions regarding the COVID-19 pandemic in an effort to limit this patient's exposure and mitigate transmission in our community.   Virtual Visit via Telephone Note  I connected with Jonathan Graham on 09/04/19 at  4:30 PM EST by telephone and verified that I am speaking with the correct person using two identifiers.   I discussed the limitations, risks, security and privacy concerns of performing an evaluation and management service by telephone and the availability of in person appointments. I also discussed with the patient that there may be a patient responsible charge related to this service. The patient expressed understanding and agreed to proceed.  Location patient: home Location provider: work or home office Participants present for the call: patient, provider, patient's wife Jonathan Graham Patient did not have a visit in the prior 7 days to address this/these issue(s).   History of Present Illness: Jonathan Graham has problems including history of aortic stenosis, history of prostate cancer, nonischemic cardiomyopathy, systolic heart failure, progressive weight loss.  He recently saw cardiology and had some hypotension.  His losartan was discontinued.  He had ejection fraction of 30 to 35% by most recent echo.  Has had progressive dyspnea with activity.  Was suggested he try some Ensure and increase hydration.  He had labs done and electrolytes were stable.  His white count was elevated at 17,000.  Wife stated he had some low-grade fever over the weekend with temperature on 100.  She states he has been sleeping about 20 to 22 hours/day.  He has had some occasional cough.  He went to [facility] last Wednesday and had negative Covid screen.  He has not had any reported burning with urination.  No abdominal pain.  Very poor appetite  She states his weight is currently down to about 130 pounds.  Family decided over the  weekend to pursue hospice care options and it sounds like they are all very comfortable with this.  Their main goal for the past several months been comfort care.  Past Medical History:  Diagnosis Date  . Arthritis   . Atypical chest pain    history of   . Back pain   . Cancer [facility]) 2014   prostate  . Cardiovascular stress test abnormal 04/25/2004   Abnormal adenosine Cardiolite study / evidence of inferoseptal ischemia &  an EF of 48% ""recommended that he undergo cardiac catheterization""  . Cervical spine disease   . Dizziness    occasionally apon bending over  . Fatigue    history of   . Heart murmur   . History of hiatal hernia   . History of mononucleosis   . Hypercholesterolemia   . Left bundle branch block   . Pleurisy 07/2005  . S/P radiation therapy  12/05/2012 through 01/30/2013                                                         Prostate 7800 cGy 40 sessions, seminal vesicles 5600 cGy 40 sessions                       . Shortness of breath   . Spondylosis, cervical    at C3-C4  . Varicose veins   . Weakness    history  of    Past Surgical History:  Procedure Laterality Date  . BACK SURGERY  1995    2 ruptured discs in lower back / by Dr. Louanne Skye  . CARDIAC CATHETERIZATION  2005   EF estimated at 50% /  PROCEDURE:  Left heart catheterization, coronary and left ventricular angiography /  RAO view demonstrates mild LV enlargement  / mild hypokinesia anteroseptal with  overall low normal LV systolic function / Normal coronary anatomy  . HEMORRHOID SURGERY    . PROSTATE BIOPSY  08/25/2012   Gleason 3+3=6 PSA 7.80  . RIGHT/LEFT HEART CATH AND CORONARY ANGIOGRAPHY N/A 11/13/2016   Procedure: Right/Left Heart Cath and Coronary Angiography;  Surgeon: [name] [name], MD;  Location: [place] [facility];  Service: Cardiovascular;  Laterality: N/A;  . TEE WITHOUT CARDIOVERSION N/A 12/08/2016   Procedure: TRANSESOPHAGEAL ECHOCARDIOGRAM (TEE);  Surgeon: Burnell Blanks,  MD;  Location: [place];  Service: Open Heart Surgery;  Laterality: N/A;  . TONSILLECTOMY AND ADENOIDECTOMY    . TRANSCATHETER AORTIC VALVE REPLACEMENT, TRANSFEMORAL N/A 12/08/2016   Procedure: TRANSCATHETER AORTIC VALVE REPLACEMENT, TRANSFEMORAL;  Surgeon: Burnell Blanks, MD;  Location: [place];  Service: Open Heart Surgery;  Laterality: N/A;    reports that he quit smoking about 68 years ago. His smoking use included cigarettes. He started smoking about 70 years ago. He has a 2.00 pack-year smoking history. He has never used smokeless tobacco. He reports that he does not drink alcohol or use drugs. family history includes Cancer in his father; Heart disease in his mother; Heart failure in his mother; Lung cancer in his brother; Multiple sclerosis in his sister; Stroke in his father. Allergies  Allergen Reactions  . Lipitor [Atorvastatin Calcium] Other (See Comments)    MYALGIAS CRAMPS  . Lisinopril Cough      Observations/Objective: Patient sounds cheerful and well on the phone. I do not appreciate any SOB. Speech and thought processing are grossly intact. Patient reported vitals:  Assessment and Plan: 86 year old male with multiple chronic medical problems as above with several month history of adult failure to thrive, progressive weight loss, anorexia, progressive weakness, and recent elevated white count.  Certainly may have infectious issue on top of chronic decline.  Did not have any recent urinalysis or chest x-ray but has had reported cough and fever as above with recent negative Covid screen.  We discussed the following  -Set up hospice referral.  Family and patient's main goal at this point is comfort care -We discussed considering empiric trial of Ceftin 250 mg per 5 mL 1 teaspoon twice daily for 7 days -Encourage hydration and they will continue to try to encourage nutritional supplements such as Ensure -Hospice referral is placed  Follow Up Instructions:  -As  above   [id number] [id number] [id number] 11-20 [id number] 21-30 I did not refer this patient for an OV in the next 24 hours for this/these issue(s).  I discussed the assessment and treatment plan with the patient. The patient was provided an opportunity to ask questions and all were answered. The patient agreed with the plan and demonstrated an understanding of the instructions.   The patient was advised to call back or seek an in-person evaluation if the symptoms worsen or if the condition fails to improve as anticipated.  I provided 25 minutes of non-face-to-face time during this encounter.   Carolann Littler, MD

## 2019-09-04 NOTE — Telephone Encounter (Signed)
Okay to order hospice referral.  I had spoken with family over the weekend he has end-stage heart failure as a diagnosis

## 2019-09-04 NOTE — Telephone Encounter (Signed)
Jan calling with [facility] called and stated that she would like orders for hospice. Please advise   [phone]

## 2019-09-05 ENCOUNTER — Telehealth: Payer: Self-pay | Admitting: *Deleted

## 2019-09-05 NOTE — Telephone Encounter (Signed)
Called patient and LMOVM to return call  [place] [facility] to Discuss results / PCP / recommendations / Schedule patient  Called and left a detailed voice message per Dr. Elease Hashimoto: I thought [place] was Hospice??    He needs comfort care - whether it be [facility] or Hospice.  CRM Created.

## 2019-09-05 NOTE — Telephone Encounter (Signed)
I thought [place] was Hospice??    He needs comfort care - whether it be [facility] or Hospice.

## 2019-09-05 NOTE — Telephone Encounter (Signed)
Spoke with Jan  gave her verbal order for hospice. Jan reports she just needs comfort orders signed off on that she faxed over last night

## 2019-09-05 NOTE — Telephone Encounter (Signed)
Please advise 

## 2019-09-05 NOTE — Telephone Encounter (Signed)
Copied from [place] [id number]. Topic: General - Inquiry >> Sep 05, 2019 10:46 AM Alease Frame wrote: Reason for CRM: Langley Gauss calling from [facility] regarding pt . They received a referral and needed to clarify if its for Hospice or a different care   Call back [phone] option 2

## 2019-09-05 NOTE — Telephone Encounter (Signed)
I have not seen any orders.  Were they faxed over?

## 2019-09-06 NOTE — Telephone Encounter (Signed)
Spoke with Jan. She informed me she did fax them over, but will fax them over again.

## 2019-09-25 ENCOUNTER — Telehealth: Payer: Self-pay | Admitting: Family Medicine

## 2019-09-25 NOTE — Telephone Encounter (Signed)
Please see message. °

## 2019-09-25 NOTE — Telephone Encounter (Signed)
Family had decided a few weeks ago that Hospice was best option for him.  I had assumed she was a part of those discussions.  Is she opposed to using Hospice at this time?  Consider setting up [place] or phone follow up.    This would need to be attached to his chart (ie visit for Karen Kays).

## 2019-09-25 NOTE — Telephone Encounter (Signed)
Patient's wife [name] wants to know if Dr. Elease Hashimoto knows what all is going on with the patient being in Hospice.

## 2019-09-25 NOTE — Telephone Encounter (Signed)
Spoke with Jonathan Graham she just wanted to make sure that Dr. Elease Hashimoto knew he was in hospice care. Wanted to express her gratitude to Dr. Elease Hashimoto as well.

## 2019-10-26 NOTE — Progress Notes (Signed)
Cardiology Office Note   Date:  10/30/2019   ID:  Jonathan Graham, DOB Sep 30, 1927, MRN [id number]  PCP:  [name], MD  Cardiologist: Leondra Cullin [name] MD  Chief Complaint  Patient presents with  . Aortic Stenosis      History of Present Illness: Jonathan Graham is a 86 y.o. male who presents for follow up Aortic stenosis, CHF, LBBB, and HLD.    He has a history of known left bundle branch block and a history of hypercholesterolemia.  He has a history of prostate CA treated with radiation therapy. The patient has a history of  aortic stenosis.  When seen earlier this year Echo demonstrated at least moderate AS. EF had dropped from 40-45% to 25-30%. As such a Dobutamine Echo and suggested he had low gradient severe AS. He subsequently underwent TAVR with Dr. Angelena Form on 12/08/16 with # 58 Edwards Sapien valve. He did well with this. Recommended DAPT for 6 months. Repeat Echo 01/08/17 showed normal function of valve but persistently low EF 30-35%. He was seen in June 2020 with an episode of orthostatic hypotension. Has not been on any BP meds. Compression hose ordered but he was not able to tolerate since he said they aggravated his large varicosities.   He has declined significantly this past year. No appetite. Weight loss. Seen in December with hypotension and losartan discontinued. Seen by Dr Elease Hashimoto in January and placed in hospice care. He was close to death and according to family even "saw the bright light" but then he seemed to rally and is now doing much better. Appetite has returned. He has gained 5-10 lbs. He is alert and denies any complaints other than still pretty weak. Wife notes memory is more of an issue.    Past Medical History:  Diagnosis Date  . Arthritis   . Atypical chest pain    history of   . Back pain   . Cancer [facility]) 2014   prostate  . Cardiovascular stress test abnormal 04/25/2004   Abnormal adenosine Cardiolite study / evidence of inferoseptal  ischemia &  an EF of 48% ""recommended that he undergo cardiac catheterization""  . Cervical spine disease   . Dizziness    occasionally apon bending over  . Fatigue    history of   . Heart murmur   . History of hiatal hernia   . History of mononucleosis   . Hypercholesterolemia   . Left bundle branch block   . Pleurisy 07/2005  . S/P radiation therapy  12/05/2012 through 01/30/2013                                                         Prostate 7800 cGy 40 sessions, seminal vesicles 5600 cGy 40 sessions                       . Shortness of breath   . Spondylosis, cervical    at C3-C4  . Varicose veins   . Weakness    history of     Past Surgical History:  Procedure Laterality Date  . BACK SURGERY  1995    2 ruptured discs in lower back / by Dr. Louanne Skye  . CARDIAC CATHETERIZATION  2005   EF estimated at 50% /  PROCEDURE:  Left heart catheterization, coronary and left ventricular angiography /  RAO view demonstrates mild LV enlargement  / mild hypokinesia anteroseptal with  overall low normal LV systolic function / Normal coronary anatomy  . HEMORRHOID SURGERY    . PROSTATE BIOPSY  08/25/2012   Gleason 3+3=6 PSA 7.80  . RIGHT/LEFT HEART CATH AND CORONARY ANGIOGRAPHY N/A 11/13/2016   Procedure: Right/Left Heart Cath and Coronary Angiography;  Surgeon: Idaly Verret M [name], MD;  Location: [place] CV [facility];  Service: Cardiovascular;  Laterality: N/A;  . TEE WITHOUT CARDIOVERSION N/A 12/08/2016   Procedure: TRANSESOPHAGEAL ECHOCARDIOGRAM (TEE);  Surgeon: Burnell Blanks, MD;  Location: [place];  Service: Open Heart Surgery;  Laterality: N/A;  . TONSILLECTOMY AND ADENOIDECTOMY    . TRANSCATHETER AORTIC VALVE REPLACEMENT, TRANSFEMORAL N/A 12/08/2016   Procedure: TRANSCATHETER AORTIC VALVE REPLACEMENT, TRANSFEMORAL;  Surgeon: Burnell Blanks, MD;  Location: [place];  Service: Open Heart Surgery;  Laterality: N/A;     Current Outpatient Medications  Medication Sig Dispense Refill    . amoxicillin (AMOXIL) 500 MG capsule TAKE 4 CAPSULES BY MOUTH 60 MINUTES PRIOR TO DENTAL PROCEDURE 4 capsule 2  . aspirin EC 81 MG tablet Take 81 mg by mouth daily.    . tamsulosin (FLOMAX) 0.4 MG CAPS capsule Take 1 capsule (0.4 mg total) by mouth at bedtime. 90 capsule 1   No current facility-administered medications for this visit.    Allergies:   Lipitor [atorvastatin calcium] and Lisinopril    Social History:  The patient  reports that he quit smoking about 68 years ago. His smoking use included cigarettes. He started smoking about 70 years ago. He has a 2.00 pack-year smoking history. He has never used smokeless tobacco. He reports that he does not drink alcohol or use drugs.   Family History:  The patient's family history includes Cancer in his father; Heart disease in his mother; Heart failure in his mother; Lung cancer in his brother; Multiple sclerosis in his sister; Stroke in his father.    ROS:  Please see the history of present illness. Otherwise, review of systems are positive for none.   All other systems are reviewed and negative.    PHYSICAL EXAM: VS:  BP (!) 95/49   Pulse 81   Temp 97.9 F (36.6 C)   Ht 5\' 11"  (1.803 m)   Wt 140 lb (63.5 kg)   SpO2 99%   BMI 19.53 kg/m  , BMI Body mass index is 19.53 kg/m. GENERAL:  Well appearing, elderly WM in NAD, seen in a wheelchair.  HEENT:  PERRL, EOMI, sclera are clear. Oropharynx is clear. NECK:  No jugular venous distention, carotid upstroke brisk and symmetric, no bruits, no thyromegaly or adenopathy LUNGS:  Clear to auscultation bilaterally CHEST:  Unremarkable HEART:  RRR,  PMI not displaced or sustained,S1 and S2 within normal limits, no S3, no S4: no clicks, no rubs, soft 1/6 systolic murmur RUSB. ABD:  Soft, nontender. BS +, no masses or bruits. No hepatomegaly, no splenomegaly EXT:  2 + pulses throughout, no edema, no cyanosis no clubbing SKIN:  Warm and dry.  No rashes NEURO:  Alert and oriented x 3.  Cranial nerves II through XII intact. PSYCH:  Cognitively intact    EKG:  EKG is not ordered today.   Recent Labs: 02/21/2019: ALT 8 03/14/2019: TSH 2.98 08/31/2019: BUN 24; Creatinine, Ser 1.21; Hemoglobin 11.7; Platelets 527; Potassium 4.7; Sodium 133    Lipid Panel    Component Value Date/Time  CHOL 168 02/21/2019 0846   TRIG 66 02/21/2019 0846   HDL 66 02/21/2019 0846   CHOLHDL 2.5 02/21/2019 0846   CHOLHDL 2.7 09/24/2016 0803   VLDL 11 09/24/2016 0803   LDLCALC 89 02/21/2019 0846      Wt Readings from Last 3 Encounters:  10/30/19 140 lb (63.5 kg)  08/31/19 135 lb 12.8 oz (61.6 kg)  03/20/19 144 lb (65.3 kg)     Cardiac cath March 2018: Procedures   Right/Left Heart Cath and Coronary Angiography  Conclusion     Ost 1st Mrg to 1st Mrg lesion, 50 %stenosed.  There is moderate to severe left ventricular systolic dysfunction.  LV end diastolic pressure is normal.  The left ventricular ejection fraction is 25-35% by visual estimate.  LV end diastolic pressure is normal.  There is moderate aortic valve stenosis.   1. Mild nonobstructive CAD 2. Normal right heart pressures 3. Normal LV filling pressures  4. Moderate Aortic stenosis with mean gradient of 26 mm Hg. AV area may be underestimated due to low EF.   Plan: consider AVR/TAVR.     Echo 01/08/17: Study Conclusions  - Left ventricle: The cavity size was normal. Wall thickness was   normal. Systolic function was moderately to severely reduced. The   estimated ejection fraction was in the range of 30% to 35%. Wall   motion was normal; there were no regional wall motion   abnormalities. Doppler parameters are consistent with abnormal   left ventricular relaxation (grade 1 diastolic dysfunction). - Ventricular septum: Septal motion showed abnormal function and   dyssynergy. - Aortic valve: A TAVR bioprosthesis was present and functioning   normally. Mean gradient (S): 12 mm Hg. Peak gradient (S):  26 mm   Hg. - Mitral valve: Calcified annulus. - Left atrium: The atrium was mildly dilated. - Pulmonary arteries: Systolic pressure was moderately increased.   PA peak pressure: 50 mm Hg (S).  Impressions:  - Compared to the prior study, there has been no significant   interval change.  Echo 12/13/17: Study Conclusions  - Left ventricle: The cavity size was normal. Wall thickness was   normal. Septal-lateral dyssynchrony with diffuse hypokinesis.   Systolic function was moderately to severely reduced. The   estimated ejection fraction was in the range of 30% to 35%.   Doppler parameters are consistent with abnormal left ventricular   relaxation (grade 1 diastolic dysfunction). - Aortic valve: Bioprosthetic aortic valve s/p TAVR. Trivial   peri-valvular regurgitation. No significant stenosis. Mean   gradient (S): 12 mm Hg. Valve area (VTI): 1.93 cm^2. - Mitral valve: Mildly calcified annulus. There was mild   regurgitation. - Left atrium: The atrium was mildly dilated. - Right ventricle: The cavity size was normal. Systolic function   was normal. - Right atrium: The atrium was mildly dilated. - Tricuspid valve: Peak RV-RA gradient (S): 26 mm Hg. - Pulmonary arteries: PA peak pressure: 29 mm Hg (S). - Inferior vena cava: The vessel was normal in size. The   respirophasic diameter changes were in the normal range (>= 50%),   consistent with normal central venous pressure.  Impressions:  - Normal LV size with EF 30-35%. Diffuse hypokinesis with   septal-lateral dyssynchrony. Normal RV size and systolic   function. Bioprosthetic aortic valve s/p TAVR with trivial AI, no   significant stenosis.   ASSESSMENT AND PLAN:  1. Severe  aortic stenosis- s/p TAVR.  Echo in April 2019 showed good valve function.  Continue ASA indefinitely.  He is asymptomatic. 2. Hypercholesterolemia. Given that he is 65 and recently had a near death experience - on hospice care I have recommended  stopping simvastatin now.  3. Prostate cancer treated with radiation therapy 4. LBBB chronic.  5. Nonischemic cardiomyopathy with EF 30-35%. On no medication due to hypotension. 6. History of orthostatic hypotension.  Follow  Up in 6 months.   Current medicines are reviewed at length with the patient today.  The patient does not have concerns regarding medicines.  The following changes have been made: none  Labs/ tests ordered today include:   No orders of the defined types were placed in this encounter.    Signed, Jennalynn Rivard [name] MD, [facility]   10/30/2019 2:13 PM    [place]

## 2019-10-30 ENCOUNTER — Ambulatory Visit: Payer: Medicare Other | Admitting: Cardiology

## 2019-10-30 ENCOUNTER — Other Ambulatory Visit: Payer: Self-pay

## 2019-10-30 ENCOUNTER — Encounter: Payer: Self-pay | Admitting: Cardiology

## 2019-10-30 VITALS — BP 95/49 | HR 81 | Temp 97.9°F | Ht 71.0 in | Wt 140.0 lb

## 2019-10-30 DIAGNOSIS — I951 Orthostatic hypotension: Secondary | ICD-10-CM | POA: Diagnosis not present

## 2019-10-30 DIAGNOSIS — I5022 Chronic systolic (congestive) heart failure: Secondary | ICD-10-CM | POA: Diagnosis not present

## 2019-10-30 DIAGNOSIS — Z952 Presence of prosthetic heart valve: Secondary | ICD-10-CM

## 2019-10-30 DIAGNOSIS — I350 Nonrheumatic aortic (valve) stenosis: Secondary | ICD-10-CM

## 2019-11-14 ENCOUNTER — Telehealth: Payer: Self-pay | Admitting: Family Medicine

## 2019-11-14 NOTE — Telephone Encounter (Signed)
OK 

## 2019-11-14 NOTE — Telephone Encounter (Signed)
Cristela Blue, RN  [phone] is calling in stating that the pt is up for re-certification for Hospice Eligibility and Cristela Blue just need a verbal order to continue Hospice Care.

## 2019-11-17 ENCOUNTER — Telehealth: Payer: Self-pay | Admitting: Family Medicine

## 2019-11-17 NOTE — Telephone Encounter (Signed)
Spoke to [employer] and gave verbal orders as requested per Dr. Elease Hashimoto.

## 2019-11-17 NOTE — Telephone Encounter (Signed)
Jonathan Graham from [facility] would like to do a post void residual next week.   The need a verbal order, if this is okay.  He can't remember if he goes to the bathroom in the middle of the night.  Please contact: Jonathan Graham  [phone]

## 2019-11-17 NOTE — Telephone Encounter (Signed)
Please advise 

## 2019-11-17 NOTE — Telephone Encounter (Signed)
Okay to proceed with verbal order as requested

## 2019-11-21 ENCOUNTER — Telehealth: Payer: Self-pay | Admitting: Family Medicine

## 2019-11-21 MED ORDER — CVS SENNA PLUS 8.6-50 MG PO TABS: 2.0000 | ORAL_TABLET | Freq: Two times a day (BID) | ORAL | 1 refills | Status: DC

## 2019-11-21 NOTE — Telephone Encounter (Signed)
Checked post void residual, there were 0 post void residual.   Pt needs refill for senna 120qty pt needs to take more to stay regular taking 2 a day now may need to take 3 or 4 a day

## 2019-11-21 NOTE — Telephone Encounter (Signed)
Spoke woth [facility] and medication was sent in

## 2019-11-21 NOTE — Telephone Encounter (Signed)
Okay to take senna as requested

## 2019-11-21 NOTE — Telephone Encounter (Signed)
Please advise 

## 2019-12-08 ENCOUNTER — Telehealth: Payer: Self-pay | Admitting: Family Medicine

## 2019-12-08 NOTE — Telephone Encounter (Signed)
Nurse from [facility] needs to touch base about mutual patient.

## 2019-12-11 NOTE — Telephone Encounter (Signed)
Tried calling-no answer.  

## 2019-12-13 NOTE — Telephone Encounter (Signed)
Jonathan Graham states that pt is having worse left knee pain and was wondering if aleve is okay to take it and how to take it for  eprescribe it for him. Please advise

## 2019-12-13 NOTE — Telephone Encounter (Signed)
Talked with Jonathan Graham and she will tell pt's wife tomorrow to stay away from nsaids

## 2019-12-13 NOTE — Telephone Encounter (Signed)
At his age and with multiple co-morbidities would recommend Tylenol rather than NSAID- if they have not tired anything for pain.

## 2019-12-18 ENCOUNTER — Telehealth: Payer: Self-pay | Admitting: Family Medicine

## 2019-12-18 NOTE — Telephone Encounter (Signed)
I would say probably no need to be tested unless he has had any concerning exposures.  This should not prevent him from getting second Covid vaccine

## 2019-12-18 NOTE — Telephone Encounter (Signed)
Left detailed message for wendy stating no need for covid test unless concerning exposures and should not prevent from getting a second shot

## 2019-12-18 NOTE — Telephone Encounter (Signed)
Pcp please advise

## 2019-12-18 NOTE — Telephone Encounter (Signed)
Pt had his 1st COVID vaccine on 12/15/19. On Saturday he had a fever and weakness. Pt would like to know if he needs to be tested for COVID and if he needs to receive his 2nd COVID vaccine? Please call Abigail Butts with [facility]. Thanks   Abigail Butts [phone]

## 2019-12-27 ENCOUNTER — Telehealth: Payer: Self-pay | Admitting: Family Medicine

## 2019-12-27 NOTE — Telephone Encounter (Signed)
Waiting for fax to come in.

## 2019-12-27 NOTE — Telephone Encounter (Signed)
Kelle Darting from Hospice called to followed up on medical orders for DOS 11/14/2019, 12/03/2019, ans 12/07/2019.

## 2020-01-05 ENCOUNTER — Other Ambulatory Visit: Payer: Self-pay

## 2020-01-08 ENCOUNTER — Encounter: Payer: Self-pay | Admitting: Family Medicine

## 2020-01-08 ENCOUNTER — Other Ambulatory Visit: Payer: Self-pay

## 2020-01-08 ENCOUNTER — Ambulatory Visit (INDEPENDENT_AMBULATORY_CARE_PROVIDER_SITE_OTHER): Payer: Medicare Other | Admitting: Family Medicine

## 2020-01-08 VITALS — BP 126/68 | HR 85 | Temp 98.0°F | Wt 145.7 lb

## 2020-01-08 DIAGNOSIS — R531 Weakness: Secondary | ICD-10-CM

## 2020-01-08 DIAGNOSIS — Z9181 History of falling: Secondary | ICD-10-CM | POA: Diagnosis not present

## 2020-01-08 DIAGNOSIS — R627 Adult failure to thrive: Secondary | ICD-10-CM | POA: Diagnosis not present

## 2020-01-08 NOTE — Progress Notes (Signed)
Subjective:     Patient ID: Jonathan Graham, male   DOB: 09/01/1927, 86 y.o.   MRN: [id number]  HPI Mr. Drenda Freeze has multiple chronic problems including history of chronic systolic heart failure, aortic stenosis, history of prostate cancer, adult failure to thrive.  Back in January they had called with overall decline in his status.  They had seen cardiology December 31.  He had recent echo with ejection fraction 30 to 35%.  They noted increased dyspnea.  He had elevated white count of 17,000.  He had low-grade fever.  He had testing for Covid at [facility] back in early January which came back negative.  No urinary symptoms.  At that point his weight was reportedly down to about 130 pounds.  Family had decided to pursue hospice care.  Referral was made but patient has had gradual recovery since then.  His weight is actually up 145 pounds today.  Their main concern at this time is generalized weakness and increased fall risk.  He does not use any assistance routinely for ambulation.  They would like to consider some physical therapy.  He had first Covid vaccine but had fever the following day at 101.  They are planning to get second vaccine but somewhat reluctantly.  His only current medication is Flomax.  He denies any significant orthostatic type symptoms.  Appetite apparently has improved some  Past Medical History:  Diagnosis Date  . Arthritis   . Atypical chest pain    history of   . Back pain   . Cancer [facility]) 2014   prostate  . Cardiovascular stress test abnormal 04/25/2004   Abnormal adenosine Cardiolite study / evidence of inferoseptal ischemia &  an EF of 48% ""recommended that he undergo cardiac catheterization""  . Cervical spine disease   . Dizziness    occasionally apon bending over  . Fatigue    history of   . Heart murmur   . History of hiatal hernia   . History of mononucleosis   . Hypercholesterolemia   . Left bundle branch block   . Pleurisy 07/2005  .  S/P radiation therapy  12/05/2012 through 01/30/2013                                                         Prostate 7800 cGy 40 sessions, seminal vesicles 5600 cGy 40 sessions                       . Shortness of breath   . Spondylosis, cervical    at C3-C4  . Varicose veins   . Weakness    history of    Past Surgical History:  Procedure Laterality Date  . BACK SURGERY  1995    2 ruptured discs in lower back / by Dr. Louanne Skye  . CARDIAC CATHETERIZATION  2005   EF estimated at 50% /  PROCEDURE:  Left heart catheterization, coronary and left ventricular angiography /  RAO view demonstrates mild LV enlargement  / mild hypokinesia anteroseptal with  overall low normal LV systolic function / Normal coronary anatomy  . HEMORRHOID SURGERY    . PROSTATE BIOPSY  08/25/2012   Gleason 3+3=6 PSA 7.80  . RIGHT/LEFT HEART CATH AND CORONARY ANGIOGRAPHY N/A 11/13/2016   Procedure: Right/Left Heart Cath and  Coronary Angiography;  Surgeon: [name] [name], MD;  Location: [place] CV [facility];  Service: Cardiovascular;  Laterality: N/A;  . TEE WITHOUT CARDIOVERSION N/A 12/08/2016   Procedure: TRANSESOPHAGEAL ECHOCARDIOGRAM (TEE);  Surgeon: Burnell Blanks, MD;  Location: [place];  Service: Open Heart Surgery;  Laterality: N/A;  . TONSILLECTOMY AND ADENOIDECTOMY    . TRANSCATHETER AORTIC VALVE REPLACEMENT, TRANSFEMORAL N/A 12/08/2016   Procedure: TRANSCATHETER AORTIC VALVE REPLACEMENT, TRANSFEMORAL;  Surgeon: Burnell Blanks, MD;  Location: [place];  Service: Open Heart Surgery;  Laterality: N/A;    reports that he quit smoking about 68 years ago. His smoking use included cigarettes. He started smoking about 70 years ago. He has a 2.00 pack-year smoking history. He has never used smokeless tobacco. He reports that he does not drink alcohol or use drugs. family history includes Cancer in his father; Heart disease in his mother; Heart failure in his mother; Lung cancer in his brother; Multiple sclerosis in  his sister; Stroke in his father. Allergies  Allergen Reactions  . Lipitor [Atorvastatin Calcium] Other (See Comments)    MYALGIAS CRAMPS  . Lisinopril Cough     Review of Systems  Constitutional: Positive for fatigue. Negative for chills and fever.  Respiratory: Negative for cough and shortness of breath.   Cardiovascular: Negative for chest pain.  Gastrointestinal: Negative for abdominal pain, nausea and vomiting.  Genitourinary: Negative for dysuria.  Neurological: Positive for weakness. Negative for dizziness.       Objective:   Physical Exam Vitals reviewed.  Constitutional:      Appearance: Normal appearance.  Cardiovascular:     Rate and Rhythm: Normal rate and regular rhythm.  Pulmonary:     Effort: Pulmonary effort is normal.     Breath sounds: Normal breath sounds.  Musculoskeletal:     Cervical back: Neck supple.     Comments: He has prominent varicose veins bilaterally but no pitting edema  Neurological:     Mental Status: He is alert.        Assessment:     86 year old male with adult failure to thrive and generalized weakness.  He has actually shown significant recovery over the past few months since what looked like imminent death back in 2022-10-19.  At this point not clear what led to his significant decline at that time.  He still is under hospice care at this point.  He has very high risk of falls    Plan:     -We will set up home physical therapy referral to assess for general strengthening and reduce fall risk -Patient had questions regarding driving and we expressed our concerns both from a physical and cognitive impairment standpoint he should not be driving at this point  [name] MD [facility]

## 2020-01-08 NOTE — Patient Instructions (Signed)

## 2020-02-14 ENCOUNTER — Telehealth: Payer: Self-pay | Admitting: Family Medicine

## 2020-02-14 NOTE — Telephone Encounter (Signed)
I think step down to Palliative Care seems appropriate.

## 2020-02-14 NOTE — Telephone Encounter (Signed)
Brandon Melnick from [facility] stated that the pt has made drastic improvement and he is up for recertification but she wonders if his [place] feels if he is still hospice appropriate or if he would like a lower level of care such as palliative care?   She states that they have a face to face meeting next week with the pt and if able can she get the most recent Office visit notes faxed at the number listed below?   Cristela Blue also stated this may need to be discussed in depth and she can be reached at [phone]   FAX: [phone]

## 2020-02-14 NOTE — Telephone Encounter (Signed)
Please advise about recert

## 2020-02-16 ENCOUNTER — Telehealth: Payer: Self-pay | Admitting: Family Medicine

## 2020-02-16 NOTE — Telephone Encounter (Signed)
Pt needs a dme prescription for the transfer wheelchair , shower chair , and bedside comode please advise this needs to go to [facility] Cristela Blue will call back with fax number

## 2020-02-16 NOTE — Telephone Encounter (Signed)
[employer] fax number :[phone]

## 2020-02-16 NOTE — Telephone Encounter (Signed)
May 10th note sent to [facility] care

## 2020-02-16 NOTE — Telephone Encounter (Signed)
Left detailed message on [facility] voicemail stating that okay to step down to pallative care

## 2020-02-17 NOTE — Telephone Encounter (Signed)
Does this need to be a written order or can this be ordered as DME on Epic?

## 2020-02-19 NOTE — Telephone Encounter (Signed)
Written order would need to be done in able to send to [facility]

## 2020-02-19 NOTE — Telephone Encounter (Signed)
Prescription sent to [phone] as given to by [employer]

## 2020-02-19 NOTE — Telephone Encounter (Signed)
done

## 2020-02-22 ENCOUNTER — Telehealth: Payer: Self-pay | Admitting: Family Medicine

## 2020-02-22 NOTE — Telephone Encounter (Signed)
FYI  For the Dr. Per  [facility] Nurse "As we discussed earlier last week, we are discharging   Jonathan Graham from [facility] care services today. We are referring him to palliative care.  the patient  was inform to  contact  the office  to make an appt  to be seen; if there is  any question, please contact  Maura  [phone]"

## 2020-02-23 ENCOUNTER — Telehealth: Payer: Self-pay | Admitting: Internal Medicine

## 2020-02-23 NOTE — Telephone Encounter (Signed)
fyi

## 2020-02-23 NOTE — Telephone Encounter (Signed)
Phone call placed to patient to offer to schedule a visit with [facility]. Phone rang, with no answer I left a voicemail for call back. 

## 2020-02-28 ENCOUNTER — Other Ambulatory Visit: Payer: Self-pay

## 2020-02-28 ENCOUNTER — Telehealth: Payer: Self-pay | Admitting: Internal Medicine

## 2020-02-28 DIAGNOSIS — R627 Adult failure to thrive: Secondary | ICD-10-CM

## 2020-02-28 DIAGNOSIS — R531 Weakness: Secondary | ICD-10-CM

## 2020-02-28 DIAGNOSIS — Z9181 History of falling: Secondary | ICD-10-CM

## 2020-02-28 NOTE — Telephone Encounter (Signed)
Called patient's home number to schedule the Palliative Consult, no answer - left message with reason for call along with my name and number requesting a return call.  I also called wife's listed cell number as well, no answer, left message with my name and contact number.

## 2020-02-29 ENCOUNTER — Telehealth: Payer: Self-pay | Admitting: Internal Medicine

## 2020-02-29 NOTE — Telephone Encounter (Signed)
Returned call to wife and discussed Palliative services with her and answered all questions and she was in agreement with this.  I have scheduled an In-person Consult for 03/05/20 @ 11 AM.

## 2020-03-05 ENCOUNTER — Other Ambulatory Visit: Payer: Self-pay

## 2020-03-05 ENCOUNTER — Other Ambulatory Visit: Payer: Medicare Other | Admitting: Internal Medicine

## 2020-03-05 ENCOUNTER — Encounter: Payer: Self-pay | Admitting: Internal Medicine

## 2020-03-05 DIAGNOSIS — Z7189 Other specified counseling: Secondary | ICD-10-CM | POA: Diagnosis not present

## 2020-03-05 DIAGNOSIS — Z515 Encounter for palliative care: Secondary | ICD-10-CM

## 2020-03-05 NOTE — Telephone Encounter (Signed)
The patients wife called to let us know that [place] contacted the patient letting them know that there is missing paperwork for PT.  They sent a fax 06/30 and 07/02  [place] [phone]

## 2020-03-05 NOTE — Progress Notes (Signed)
July 6th, 2021 [facility] Palliative Care Consult Note Telephone: [phone]  Fax: [phone]  PATIENT NAME: Jonathan Graham DOB: April 14, 1928 MRN: [id number]  [place]  [place] [address] [phone] (Mobile)   [phone] (Home Phone)  PRIMARY CARE PROVIDER:   [name], MD  Dr. Peter Graham (Cardiology)  REFERRING PROVIDER:  [name], MD [place],  [place] [address]  RESPONSIBLE PARTYObaloluwa, Graham (Spouse)  [phone] (Mobile)  ASSESSMENT / RECOMMENDATIONS:  1. Advance Care Planning: A. Directives: Discussed with patient and spouse. They both wish full resuscitative efforts in the event of a cardiopulmonary arrest.  B. Goals of Care: Increase in strength and endurance so can walk further Currently able to walk to the mailbox. Waiting for in home PT consult.  Recently discharged from hospice services 02/22/2020 (admitted to services after discharge from hospital when he was bedbound with a performance status of 30% thought r/t heart failure) as increased strength and PPS  2. Cognitive / Functional status:  Some difficulty with name finding. Alert and oriented. Pleasantly engaging. HOH.  Urination ok (Flomax). Occasional constipation well managed with prn Senna.  Independent in dressing, showering (spouse stands by for reassurance). Can go up and down stairs if careful. Activity limited d/t SOB and arthritic knee.   R knee pain. Takes Aleave prn (average 1-2 x/week) -Recommended Tylenol 500mg  tid.  Maintaining weight of 145lbs, a gain of about 5 lbs over the last few months. His BMI is 20.32 kg/m2  3. Family Supports: Spouse Jonathan Graham for 75 yrs. Son Jonathan Graham) and daughter Jonathan Graham) both in [place] and visit frequently. Lives in their own home. Spouse can't leave patient alone.  4. Follow up Palliative Care Visit: Will call on a prn basis; they have my contact information.  I spent  60 minutes providing this consultation from 11am - noon. More than 50% of the time in this consultation was spent coordinating communication.   HISTORY OF PRESENT ILLNESS:  Jonathan Graham is a 86 y.o.  male with h/o aortic stenosis  (TAVR 4/10/20218. echo earlier 2021 EF 25030% with moderate AS), nonischemic cardiomyopathy, CHF, LBBB, HLD, prostate CA (radiation), mild cognitive decline. Palliative Care was asked to help address goals of care.   CODE STATUS: Full Code  PPS: 60%  HOSPICE ELIGIBILITY/DIAGNOSIS: TBDPAST MEDICAL HISTORY:  Past Medical History:  Diagnosis Date  . Arthritis   . Atypical chest pain    history of   . Back pain   . Cancer [facility]) 2014   prostate  . Cardiovascular stress test abnormal 04/25/2004   Abnormal adenosine Cardiolite study / evidence of inferoseptal ischemia &  an EF of 48% ""recommended that he undergo cardiac catheterization""  . Cervical spine disease   . Dizziness    occasionally apon bending over  . Fatigue    history of   . Heart murmur   . History of hiatal hernia   . History of mononucleosis   . Hypercholesterolemia   . Left bundle branch block   . Pleurisy 07/2005  . S/P radiation therapy  12/05/2012 through 01/30/2013                                                         Prostate 7800 cGy 40 sessions, seminal vesicles  5600 cGy 40 sessions                       . Shortness of breath   . Spondylosis, cervical    at C3-C4  . Varicose veins   . Weakness    history of     SOCIAL HX:  Social History   Tobacco Use  . Smoking status: Former Smoker    Packs/day: 1.00    Years: 2.00    Pack years: 2.00    Types: Cigarettes    Start date: 08/31/1949    Quit date: 09/01/1951    Years since quitting: 68.5  . Smokeless tobacco: Never Used  Substance Use Topics  . Alcohol use: No    ALLERGIES:  Allergies  Allergen Reactions  . Lipitor [Atorvastatin Calcium] Other (See Comments)    MYALGIAS CRAMPS  . Lisinopril Cough       PERTINENT MEDICATIONS:  Outpatient Encounter Medications as of 03/05/2020  Medication Sig  . aspirin EC 81 MG tablet Take 81 mg by mouth daily.  . tamsulosin (FLOMAX) 0.4 MG CAPS capsule Take 1 capsule (0.4 mg total) by mouth at bedtime.  . [facility] SENNA PLUS 8.6-50 MG tablet Take 2 tablets by mouth 2 (two) times daily. Or as directed by hospice nurse (Patient not taking: Reported on 03/05/2020)  . [DISCONTINUED] amoxicillin (AMOXIL) 500 MG capsule TAKE 4 CAPSULES BY MOUTH 60 MINUTES PRIOR TO DENTAL PROCEDURE (Patient not taking: Reported on 01/08/2020)   No facility-administered encounter medications on file as of 03/05/2020.    Julianne Handler, NP-C [phone]

## 2020-03-06 NOTE — Telephone Encounter (Signed)
I don't recall seeing any paperwork regarding Jonathan Graham.

## 2020-03-06 NOTE — Telephone Encounter (Signed)
Please advise on PT. 

## 2020-03-06 NOTE — Telephone Encounter (Signed)
Spoke to [facility] they were not asking for PT they were sking for documentation this had already been sent in

## 2020-03-06 NOTE — Telephone Encounter (Signed)
OK 

## 2020-03-06 NOTE — Telephone Encounter (Signed)
Okay to order PT

## 2020-03-11 ENCOUNTER — Telehealth: Payer: Self-pay | Admitting: Family Medicine

## 2020-03-11 NOTE — Telephone Encounter (Signed)
This has been faxed for the 3rd time to [facility]

## 2020-03-11 NOTE — Telephone Encounter (Signed)
Anderson Malta with [employer] is calling needing supporting document for the pt needing the following equipment wheelchair,shower chair and a bedside commode the office note can be faxed to [phone] atten: Anderson Malta.

## 2020-03-18 NOTE — Telephone Encounter (Signed)
Anderson Malta from [facility] called stating that they received the orders dated 02/28/2020 but they are also needing the last office notes and the message under comments on the DME wheelchair order needs to be in the office notes and the comments on the order for the wheelchair should say Standard Wheelchair.  Once updated please fax to [phone]  Please advise

## 2020-03-19 NOTE — Telephone Encounter (Signed)
Office notes had already been faxed over

## 2020-03-21 ENCOUNTER — Telehealth: Payer: Self-pay | Admitting: Family Medicine

## 2020-03-21 ENCOUNTER — Other Ambulatory Visit: Payer: Self-pay | Admitting: Family Medicine

## 2020-03-21 NOTE — Telephone Encounter (Signed)
Anderson Malta form [facility] is now declining the order for wheelchair.   Please advise

## 2020-04-08 DIAGNOSIS — M1711 Unilateral primary osteoarthritis, right knee: Secondary | ICD-10-CM | POA: Diagnosis not present

## 2020-04-10 ENCOUNTER — Ambulatory Visit (INDEPENDENT_AMBULATORY_CARE_PROVIDER_SITE_OTHER): Payer: Medicare Other | Admitting: Family Medicine

## 2020-04-10 ENCOUNTER — Other Ambulatory Visit: Payer: Self-pay

## 2020-04-10 ENCOUNTER — Encounter: Payer: Self-pay | Admitting: Family Medicine

## 2020-04-10 VITALS — BP 100/62 | HR 64 | Temp 97.6°F | Ht 71.0 in | Wt 144.0 lb

## 2020-04-10 DIAGNOSIS — H9201 Otalgia, right ear: Secondary | ICD-10-CM

## 2020-04-10 NOTE — Patient Instructions (Signed)
Recommend trial of over the counter Flonase 2 sprays per nostril once daily

## 2020-04-10 NOTE — Progress Notes (Signed)
Established Patient Office Visit  Subjective:  Patient ID: Jonathan Graham, male    DOB: November 07, 1927  Age: 86 y.o. MRN: [id number]  CC:  Chief Complaint  Patient presents with  . Ear Pain    Pt stated that 3 days ago he was having R ear pain. Pt stated that it feels better today.     HPI  Jonathan Graham presents for right ear pain for 3 to 4 weeks.  He has noted that his pain seems to be worse when he is laying down at night in supine position and improved after sitting up within minutes.  He has had some recent rhinorrhea which has been clear.  He takes Claritin for that.  No recent altitude change or flying.  He states he had multiple ear infections as a child and through young adulthood.  No recent ear drainage.  He has chronic bilateral hearing loss and uses hearing aids bilaterally.  No sore throat symptoms.  No headaches.  No facial weakness.  Past Medical History:  Diagnosis Date  . Arthritis   . Atypical chest pain    history of   . Back pain   . Cancer [facility]) 2014   prostate  . Cardiovascular stress test abnormal 04/25/2004   Abnormal adenosine Cardiolite study / evidence of inferoseptal ischemia &  an EF of 48% ""recommended that he undergo cardiac catheterization""  . Cervical spine disease   . Dizziness    occasionally apon bending over  . Fatigue    history of   . Heart murmur   . History of hiatal hernia   . History of mononucleosis   . Hypercholesterolemia   . Left bundle branch block   . Pleurisy 07/2005  . S/P radiation therapy  12/05/2012 through 01/30/2013                                                         Prostate 7800 cGy 40 sessions, seminal vesicles 5600 cGy 40 sessions                       . Shortness of breath   . Spondylosis, cervical    at C3-C4  . Varicose veins   . Weakness    history of     Past Surgical History:  Procedure Laterality Date  . BACK SURGERY  1995    2 ruptured discs in lower back / by Dr. Louanne Skye  . CARDIAC  CATHETERIZATION  2005   EF estimated at 50% /  PROCEDURE:  Left heart catheterization, coronary and left ventricular angiography /  RAO view demonstrates mild LV enlargement  / mild hypokinesia anteroseptal with  overall low normal LV systolic function / Normal coronary anatomy  . HEMORRHOID SURGERY    . PROSTATE BIOPSY  08/25/2012   Gleason 3+3=6 PSA 7.80  . RIGHT/LEFT HEART CATH AND CORONARY ANGIOGRAPHY N/A 11/13/2016   Procedure: Right/Left Heart Cath and Coronary Angiography;  Surgeon: [name] [name], MD;  Location: [facility];  Service: Cardiovascular;  Laterality: N/A;  . TEE WITHOUT CARDIOVERSION N/A 12/08/2016   Procedure: TRANSESOPHAGEAL ECHOCARDIOGRAM (TEE);  Surgeon: Burnell Blanks, MD;  Location: [facility];  Service: Open Heart Surgery;  Laterality: N/A;  . TONSILLECTOMY AND ADENOIDECTOMY    . TRANSCATHETER AORTIC  VALVE REPLACEMENT, TRANSFEMORAL N/A 12/08/2016   Procedure: TRANSCATHETER AORTIC VALVE REPLACEMENT, TRANSFEMORAL;  Surgeon: Burnell Blanks, MD;  Location: [facility];  Service: Open Heart Surgery;  Laterality: N/A;    Family History  Problem Relation Age of Onset  . Stroke Father   . Cancer Father   . Heart failure Mother   . Heart disease Mother   . Multiple sclerosis Sister   . Lung cancer Brother     Social History   Socioeconomic History  . Marital status: Married    Spouse name: Not on file  . Number of children: 2  . Years of education: Not on file  . Highest education level: Not on file  Occupational History  . Occupation: Insurance    Comment: Financial planner  Tobacco Use  . Smoking status: Former Smoker    Packs/day: 1.00    Years: 2.00    Pack years: 2.00    Types: Cigarettes    Start date: 08/31/1949    Quit date: 09/01/1951    Years since quitting: 68.6  . Smokeless tobacco: Never Used  Vaping Use  . Vaping Use: Never used  Substance and Sexual Activity  . Alcohol use: No  . Drug use: No  . Sexual activity: Not on file    Other Topics Concern  . Not on file  Social History Narrative  . Not on file   Social Determinants of Health   Financial Resource Strain:   . Difficulty of Paying Living Expenses:   Food Insecurity:   . Worried About Charity fundraiser in the Last Year:   . Arboriculturist in the Last Year:   Transportation Needs:   . Film/video editor (Medical):   [name] Lack of Transportation (Non-Medical):   Physical Activity:   . Days of Exercise per Week:   . Minutes of Exercise per Session:   Stress:   . Feeling of Stress :   Social Connections:   . Frequency of Communication with Friends and Family:   . Frequency of Social Gatherings with Friends and Family:   . Attends Religious Services:   . Active Member of Clubs or Organizations:   . Attends Archivist Meetings:   [name] Marital Status:   Intimate Partner Violence:   . Fear of Current or Ex-Partner:   . Emotionally Abused:   [name] Physically Abused:   . Sexually Abused:     Outpatient Medications Prior to Visit  Medication Sig Dispense Refill  . aspirin EC 81 MG tablet Take 81 mg by mouth daily.    . [facility]S SENNA PLUS 8.6-50 MG tablet TAKE 2 TABLETS BY MOUTH 2 (TWO) TIMES DAILY. OR AS DIRECTED BY HOSPICE NURSE 120 tablet 1  . tamsulosin (FLOMAX) 0.4 MG CAPS capsule Take 1 capsule (0.4 mg total) by mouth at bedtime. 90 capsule 1   No facility-administered medications prior to visit.    Allergies  Allergen Reactions  . Lipitor [Atorvastatin Calcium] Other (See Comments)    MYALGIAS CRAMPS  . Lisinopril Cough    ROS Review of Systems  Constitutional: Negative for chills and fever.  HENT: Positive for ear pain. Negative for ear discharge, sore throat and trouble swallowing.   Skin: Negative for rash.  Hematological: Negative for adenopathy.      Objective:    Physical Exam Vitals reviewed.  HENT:     Ears:     Comments: Both ear canals are clear.  He has some scarring of the  right eardrum but no acute changes.   No erythema.  No bulging.  No visible effusion.    Mouth/Throat:     Mouth: Mucous membranes are moist.     Pharynx: Oropharynx is clear.  Cardiovascular:     Rate and Rhythm: Normal rate.  Pulmonary:     Effort: Pulmonary effort is normal.     Breath sounds: Normal breath sounds.  Musculoskeletal:     Cervical back: Neck supple.  Lymphadenopathy:     Cervical: No cervical adenopathy.     BP 100/62   Pulse 64   Temp 97.6 F (36.4 C) (Oral)   Ht 5\' 11"  (1.803 m)   Wt 144 lb (65.3 kg)   SpO2 98%   BMI 20.08 kg/m  Wt Readings from Last 3 Encounters:  04/10/20 144 lb (65.3 kg)  01/08/20 145 lb 11.2 oz (66.1 kg)  10/30/19 140 lb (63.5 kg)     Health Maintenance Due  Topic Date Due  . COVID-19 Vaccine (1) Never done  . PNA vac Low Risk Adult (2 of 2 - PPSV23) 04/28/2017  . INFLUENZA VACCINE  03/31/2020    There are no preventive care reminders to display for this patient.  Lab Results  Component Value Date   TSH 2.98 03/14/2019   Lab Results  Component Value Date   WBC 17.2 (H) 08/31/2019   HGB 11.7 (L) 08/31/2019   HCT 35.6 (L) 08/31/2019   MCV 87 08/31/2019   PLT 527 (H) 08/31/2019   Lab Results  Component Value Date   NA 133 (L) 08/31/2019   K 4.7 08/31/2019   CO2 21 08/31/2019   GLUCOSE 111 (H) 08/31/2019   BUN 24 08/31/2019   CREATININE 1.21 08/31/2019   BILITOT 0.7 02/21/2019   ALKPHOS 93 02/21/2019   AST 17 02/21/2019   ALT 8 02/21/2019   PROT 6.5 02/21/2019   ALBUMIN 4.5 02/21/2019   CALCIUM 8.7 08/31/2019   ANIONGAP 7 12/10/2016   GFR 72.53 02/09/2017   Lab Results  Component Value Date   CHOL 168 02/21/2019   Lab Results  Component Value Date   HDL 66 02/21/2019   Lab Results  Component Value Date   LDLCALC 89 02/21/2019   Lab Results  Component Value Date   TRIG 66 02/21/2019   Lab Results  Component Value Date   CHOLHDL 2.5 02/21/2019   Lab Results  Component Value Date   HGBA1C 5.8 (H) 12/02/2016      Assessment  & Plan:   Right otalgia.  Other than some scarring of the eardrum he has normal exam.  Question eustachian tube dysfunction  -Continue Claritin once daily -Add Flonase 2 sprays per nostril once daily  No orders of the defined types were placed in this encounter.   Follow-up: No follow-ups on file.    Carolann Littler, MD

## 2020-04-26 DIAGNOSIS — Z9849 Cataract extraction status, unspecified eye: Secondary | ICD-10-CM | POA: Diagnosis not present

## 2020-04-26 DIAGNOSIS — H524 Presbyopia: Secondary | ICD-10-CM | POA: Diagnosis not present

## 2020-04-26 DIAGNOSIS — H5203 Hypermetropia, bilateral: Secondary | ICD-10-CM | POA: Diagnosis not present

## 2020-04-26 NOTE — Progress Notes (Signed)
Cardiology Office Note   Date:  04/29/2020   ID:  Jonathan Graham, DOB 1927/09/15, MRN [id number]  PCP:  [name], MD  Cardiologist: Chanc Kervin [name] MD  Chief Complaint  Patient presents with  . Aortic Stenosis      History of Present Illness: Jonathan Graham is a 86 y.o. male who presents for follow up Aortic stenosis, CHF, LBBB, and HLD.    He has a history of known left bundle branch block and a history of hypercholesterolemia.  He has a history of prostate CA treated with radiation therapy. The patient has a history of  aortic stenosis.  When seen earlier this year Echo demonstrated at least moderate AS. EF had dropped from 40-45% to 25-30%. As such a Dobutamine Echo and suggested he had low gradient severe AS. He subsequently underwent TAVR with Dr. Angelena Form on 12/08/16 with # 37 Edwards Sapien valve. He did well with this. Recommended DAPT for 6 months. Repeat Echo 01/08/17 showed normal function of valve but persistently low EF 30-35%. He was seen in June 2020 with an episode of orthostatic hypotension. Has not been on any BP meds. Compression hose ordered but he was not able to tolerate since he said they aggravated his large varicosities.   He had marked decline last year. No appetite. Weight loss. Seen in December with hypotension and losartan discontinued. Seen by Dr Elease Hashimoto in January and placed in hospice care. He was close to death and according to family even "saw the bright light" but then he seemed to rally and is now doing much better. Appetite has returned. He feel pretty well now. Notes tremor is a little worse. No chest pain, dyspnea, edema.      Past Medical History:  Diagnosis Date  . Arthritis   . Atypical chest pain    history of   . Back pain   . Cancer [facility]) 2014   prostate  . Cardiovascular stress test abnormal 04/25/2004   Abnormal adenosine Cardiolite study / evidence of inferoseptal ischemia &  an EF of 48% ""recommended that he undergo  cardiac catheterization""  . Cervical spine disease   . Dizziness    occasionally apon bending over  . Fatigue    history of   . Heart murmur   . History of hiatal hernia   . History of mononucleosis   . Hypercholesterolemia   . Left bundle branch block   . Pleurisy 07/2005  . S/P radiation therapy  12/05/2012 through 01/30/2013                                                         Prostate 7800 cGy 40 sessions, seminal vesicles 5600 cGy 40 sessions                       . Shortness of breath   . Spondylosis, cervical    at C3-C4  . Varicose veins   . Weakness    history of     Past Surgical History:  Procedure Laterality Date  . BACK SURGERY  1995    2 ruptured discs in lower back / by Dr. Louanne Skye  . CARDIAC CATHETERIZATION  2005   EF estimated at 50% /  PROCEDURE:  Left heart catheterization, coronary and left ventricular  angiography /  RAO view demonstrates mild LV enlargement  / mild hypokinesia anteroseptal with  overall low normal LV systolic function / Normal coronary anatomy  . HEMORRHOID SURGERY    . PROSTATE BIOPSY  08/25/2012   Gleason 3+3=6 PSA 7.80  . RIGHT/LEFT HEART CATH AND CORONARY ANGIOGRAPHY N/A 11/13/2016   Procedure: Right/Left Heart Cath and Coronary Angiography;  Surgeon: Teagan Heidrick M [name], MD;  Location: [place] [facility];  Service: Cardiovascular;  Laterality: N/A;  . TEE WITHOUT CARDIOVERSION N/A 12/08/2016   Procedure: TRANSESOPHAGEAL ECHOCARDIOGRAM (TEE);  Surgeon: Burnell Blanks, MD;  Location: [place];  Service: Open Heart Surgery;  Laterality: N/A;  . TONSILLECTOMY AND ADENOIDECTOMY    . TRANSCATHETER AORTIC VALVE REPLACEMENT, TRANSFEMORAL N/A 12/08/2016   Procedure: TRANSCATHETER AORTIC VALVE REPLACEMENT, TRANSFEMORAL;  Surgeon: Burnell Blanks, MD;  Location: [place];  Service: Open Heart Surgery;  Laterality: N/A;     Current Outpatient Medications  Medication Sig Dispense Refill  . aspirin EC 81 MG tablet Take 81 mg by mouth daily.     . CVS SENNA PLUS 8.6-50 MG tablet TAKE 2 TABLETS BY MOUTH 2 (TWO) TIMES DAILY. OR AS DIRECTED BY HOSPICE NURSE 120 tablet 1  . tamsulosin (FLOMAX) 0.4 MG CAPS capsule Take 1 capsule (0.4 mg total) by mouth at bedtime. 90 capsule 1   No current facility-administered medications for this visit.    Allergies:   Lipitor [atorvastatin calcium] and Lisinopril    Social History:  The patient  reports that he quit smoking about 68 years ago. His smoking use included cigarettes. He started smoking about 70 years ago. He has a 2.00 pack-year smoking history. He has never used smokeless tobacco. He reports that he does not drink alcohol and does not use drugs.   Family History:  The patient's family history includes Cancer in his father; Heart disease in his mother; Heart failure in his mother; Lung cancer in his brother; Multiple sclerosis in his sister; Stroke in his father.    ROS:  Please see the history of present illness. Otherwise, review of systems are positive for none.   All other systems are reviewed and negative.    PHYSICAL EXAM: VS:  BP 114/64   Pulse 73   Ht 5\' 11"  (1.803 m)   Wt 145 lb (65.8 kg)   SpO2 97%   BMI 20.22 kg/m  , BMI Body mass index is 20.22 kg/m. GENERAL:  Well appearing, elderly WM in NAD, seen in a wheelchair.  HEENT:  PERRL, EOMI, sclera are clear. Oropharynx is clear. NECK:  No jugular venous distention, carotid upstroke brisk and symmetric, no bruits, no thyromegaly or adenopathy LUNGS:  Clear to auscultation bilaterally CHEST:  Unremarkable HEART:  RRR,  PMI not displaced or sustained,S1 and S2 within normal limits, no S3, no S4: no clicks, no rubs, soft 1/6 systolic murmur RUSB. ABD:  Soft, nontender. BS +, no masses or bruits. No hepatomegaly, no splenomegaly EXT:  2 + pulses throughout, no edema, no cyanosis no clubbing SKIN:  Warm and dry.  No rashes NEURO:  Alert and oriented x 3. Cranial nerves II through XII intact. PSYCH:  Cognitively  intact    EKG:  EKG is not ordered today.   Recent Labs: 08/31/2019: BUN 24; Creatinine, Ser 1.21; Hemoglobin 11.7; Platelets 527; Potassium 4.7; Sodium 133    Lipid Panel    Component Value Date/Time   CHOL 168 02/21/2019 0846   TRIG 66 02/21/2019 0846   HDL 66  02/21/2019 0846   CHOLHDL 2.5 02/21/2019 0846   CHOLHDL 2.7 09/24/2016 0803   VLDL 11 09/24/2016 0803   LDLCALC 89 02/21/2019 0846      Wt Readings from Last 3 Encounters:  04/29/20 145 lb (65.8 kg)  04/10/20 144 lb (65.3 kg)  01/08/20 145 lb 11.2 oz (66.1 kg)     Cardiac cath March 2018: Procedures   Right/Left Heart Cath and Coronary Angiography  Conclusion     Ost 1st Mrg to 1st Mrg lesion, 50 %stenosed.  There is moderate to severe left ventricular systolic dysfunction.  LV end diastolic pressure is normal.  The left ventricular ejection fraction is 25-35% by visual estimate.  LV end diastolic pressure is normal.  There is moderate aortic valve stenosis.   1. Mild nonobstructive CAD 2. Normal right heart pressures 3. Normal LV filling pressures  4. Moderate Aortic stenosis with mean gradient of 26 mm Hg. AV area may be underestimated due to low EF.   Plan: consider AVR/TAVR.     Echo 01/08/17: Study Conclusions  - Left ventricle: The cavity size was normal. Wall thickness was   normal. Systolic function was moderately to severely reduced. The   estimated ejection fraction was in the range of 30% to 35%. Wall   motion was normal; there were no regional wall motion   abnormalities. Doppler parameters are consistent with abnormal   left ventricular relaxation (grade 1 diastolic dysfunction). - Ventricular septum: Septal motion showed abnormal function and   dyssynergy. - Aortic valve: A TAVR bioprosthesis was present and functioning   normally. Mean gradient (S): 12 mm Hg. Peak gradient (S): 26 mm   Hg. - Mitral valve: Calcified annulus. - Left atrium: The atrium was mildly dilated. -  Pulmonary arteries: Systolic pressure was moderately increased.   PA peak pressure: 50 mm Hg (S).  Impressions:  - Compared to the prior study, there has been no significant   interval change.  Echo 12/13/17: Study Conclusions  - Left ventricle: The cavity size was normal. Wall thickness was   normal. Septal-lateral dyssynchrony with diffuse hypokinesis.   Systolic function was moderately to severely reduced. The   estimated ejection fraction was in the range of 30% to 35%.   Doppler parameters are consistent with abnormal left ventricular   relaxation (grade 1 diastolic dysfunction). - Aortic valve: Bioprosthetic aortic valve s/p TAVR. Trivial   peri-valvular regurgitation. No significant stenosis. Mean   gradient (S): 12 mm Hg. Valve area (VTI): 1.93 cm^2. - Mitral valve: Mildly calcified annulus. There was mild   regurgitation. - Left atrium: The atrium was mildly dilated. - Right ventricle: The cavity size was normal. Systolic function   was normal. - Right atrium: The atrium was mildly dilated. - Tricuspid valve: Peak RV-RA gradient (S): 26 mm Hg. - Pulmonary arteries: PA peak pressure: 29 mm Hg (S). - Inferior vena cava: The vessel was normal in size. The   respirophasic diameter changes were in the normal range (>= 50%),   consistent with normal central venous pressure.  Impressions:  - Normal LV size with EF 30-35%. Diffuse hypokinesis with   septal-lateral dyssynchrony. Normal RV size and systolic   function. Bioprosthetic aortic valve s/p TAVR with trivial AI, no   significant stenosis.   ASSESSMENT AND PLAN:  1. Severe  aortic stenosis- s/p TAVR.  Echo in April 2019 showed good valve function.  Continue ASA indefinitely. He is asymptomatic. 2. Hypercholesterolemia. Given that he is 80 and  had a  near death experience earlier this year we will forgo statin therapy. 3. Prostate cancer treated with radiation therapy 4. LBBB chronic.  5. Nonischemic  cardiomyopathy with EF 30-35%. On no medication due to hypotension. 6. History of orthostatic hypotension. 7. Chronic tremor.  Follow  Up in 6 months.   Current medicines are reviewed at length with the patient today.  The patient does not have concerns regarding medicines.  The following changes have been made: none  Labs/ tests ordered today include:   No orders of the defined types were placed in this encounter.    Signed, Jream Broyles [name] MD, [facility]   04/29/2020 2:10 PM    [place]

## 2020-04-29 ENCOUNTER — Ambulatory Visit (INDEPENDENT_AMBULATORY_CARE_PROVIDER_SITE_OTHER): Payer: Medicare Other | Admitting: Cardiology

## 2020-04-29 ENCOUNTER — Other Ambulatory Visit: Payer: Self-pay

## 2020-04-29 ENCOUNTER — Encounter: Payer: Self-pay | Admitting: Cardiology

## 2020-04-29 VITALS — BP 114/64 | HR 73 | Ht 71.0 in | Wt 145.0 lb

## 2020-04-29 DIAGNOSIS — I5022 Chronic systolic (congestive) heart failure: Secondary | ICD-10-CM

## 2020-04-29 DIAGNOSIS — I447 Left bundle-branch block, unspecified: Secondary | ICD-10-CM

## 2020-04-29 DIAGNOSIS — Z952 Presence of prosthetic heart valve: Secondary | ICD-10-CM | POA: Diagnosis not present

## 2020-04-29 DIAGNOSIS — I951 Orthostatic hypotension: Secondary | ICD-10-CM | POA: Diagnosis not present

## 2020-04-29 DIAGNOSIS — I350 Nonrheumatic aortic (valve) stenosis: Secondary | ICD-10-CM | POA: Diagnosis not present

## 2020-08-27 ENCOUNTER — Other Ambulatory Visit: Payer: Self-pay

## 2020-08-28 ENCOUNTER — Ambulatory Visit (INDEPENDENT_AMBULATORY_CARE_PROVIDER_SITE_OTHER): Payer: Medicare Other

## 2020-08-28 DIAGNOSIS — Z23 Encounter for immunization: Secondary | ICD-10-CM

## 2020-09-11 ENCOUNTER — Telehealth: Payer: Self-pay

## 2020-09-11 NOTE — Telephone Encounter (Signed)
Volunteer called patient on behalf of Palliative Care. Patient is doing okay at this time, he is having some sinus and memory issues.

## 2020-09-23 ENCOUNTER — Other Ambulatory Visit: Payer: Self-pay

## 2020-09-23 ENCOUNTER — Ambulatory Visit (INDEPENDENT_AMBULATORY_CARE_PROVIDER_SITE_OTHER): Payer: Medicare Other | Admitting: Family Medicine

## 2020-09-23 VITALS — BP 124/70 | HR 91 | Wt 137.0 lb

## 2020-09-23 DIAGNOSIS — M545 Low back pain, unspecified: Secondary | ICD-10-CM

## 2020-09-23 DIAGNOSIS — R634 Abnormal weight loss: Secondary | ICD-10-CM

## 2020-09-23 DIAGNOSIS — R5383 Other fatigue: Secondary | ICD-10-CM

## 2020-09-23 LAB — CBC WITH DIFFERENTIAL/PLATELET
Basophils Absolute: 0.2 10*3/uL — ABNORMAL HIGH (ref 0.0–0.1)
Basophils Relative: 2.8 % (ref 0.0–3.0)
Eosinophils Absolute: 0.2 10*3/uL (ref 0.0–0.7)
Eosinophils Relative: 2.9 % (ref 0.0–5.0)
HCT: 38.7 % — ABNORMAL LOW (ref 39.0–52.0)
Hemoglobin: 12.8 g/dL — ABNORMAL LOW (ref 13.0–17.0)
Lymphocytes Relative: 13.9 % (ref 12.0–46.0)
Lymphs Abs: 1 10*3/uL (ref 0.7–4.0)
MCHC: 33.1 g/dL (ref 30.0–36.0)
MCV: 87.6 fl (ref 78.0–100.0)
Monocytes Absolute: 0.6 10*3/uL (ref 0.1–1.0)
Monocytes Relative: 8.5 % (ref 3.0–12.0)
Neutro Abs: 5.2 10*3/uL (ref 1.4–7.7)
Neutrophils Relative %: 71.9 % (ref 43.0–77.0)
Platelets: 219 10*3/uL (ref 150.0–400.0)
RBC: 4.42 Mil/uL (ref 4.22–5.81)
RDW: 15.2 % (ref 11.5–15.5)
WBC: 7.2 10*3/uL (ref 4.0–10.5)

## 2020-09-23 LAB — HEPATIC FUNCTION PANEL
ALT: 9 U/L (ref 0–53)
AST: 19 U/L (ref 0–37)
Albumin: 4.5 g/dL (ref 3.5–5.2)
Alkaline Phosphatase: 91 U/L (ref 39–117)
Bilirubin, Direct: 0.2 mg/dL (ref 0.0–0.3)
Total Bilirubin: 0.7 mg/dL (ref 0.2–1.2)
Total Protein: 7.3 g/dL (ref 6.0–8.3)

## 2020-09-23 LAB — BASIC METABOLIC PANEL
BUN: 39 mg/dL — ABNORMAL HIGH (ref 6–23)
CO2: 27 mEq/L (ref 19–32)
Calcium: 9.7 mg/dL (ref 8.4–10.5)
Chloride: 104 mEq/L (ref 96–112)
Creatinine, Ser: 1.27 mg/dL (ref 0.40–1.50)
GFR: 49.55 mL/min — ABNORMAL LOW (ref >60.00)
Glucose, Bld: 96 mg/dL (ref 70–99)
Potassium: 4.8 mEq/L (ref 3.5–5.1)
Sodium: 138 mEq/L (ref 135–145)

## 2020-09-23 LAB — SEDIMENTATION RATE: Sed Rate: 12 mm/hr (ref 0–20)

## 2020-09-23 LAB — TSH: TSH: 2.63 u[IU]/mL (ref 0.35–4.50)

## 2020-09-23 NOTE — Progress Notes (Signed)
Established Patient Office Visit  Subjective:  Patient ID: Jonathan Graham, male    DOB: Apr 25, 1928  Age: 87 y.o. MRN: [id number]  CC:  Chief Complaint  Patient presents with  . Weight Loss    HPI Jonathan Graham presents for medical follow-up.  He has chronic problems including history of aortic stenosis, chronic systolic heart failure, osteoarthritis, history of prostate cancer, weight loss, adult failure to thrive.  He had severe illness last winter and almost died.  He has recovered fairly well although he has lost document 7 pounds since last August.  He and his wife state his appetite is fair but he generally only eats about 2 meals per day.  He states he usually eats Cheerios and toast for breakfast frequently skips lunch and usually has supper.  Not using any nutritional supplements.  They have not done any calorie counting.  Currently only taking Flomax.  No other medications.  No recent dyspnea.  No chest pains.  No recent falls.  His gait is somewhat unstable.  Relates onset last week and of severe left-sided low back pain.  No injury.  He took Aleve a couple times which seemed to help.  Pain is improved today.  No radiculitis symptoms.  Denies any recent acute abdominal pain.  No radiation of pain.  Denies any recent abdominal pain, fever, hematuria, headaches, chest pains, dyspnea, cough  He does have cognitive disturbance and probably some dementia.  Impairment with short-term memory  Past Medical History:  Diagnosis Date  . Arthritis   . Atypical chest pain    history of   . Back pain   . Cancer [facility]) 2014   prostate  . Cardiovascular stress test abnormal 04/25/2004   Abnormal adenosine Cardiolite study / evidence of inferoseptal ischemia &  an EF of 48% ""recommended that he undergo cardiac catheterization""  . Cervical spine disease   . Dizziness    occasionally apon bending over  . Fatigue    history of   . Heart murmur   . History of hiatal hernia   .  History of mononucleosis   . Hypercholesterolemia   . Left bundle branch block   . Pleurisy 07/2005  . S/P radiation therapy  12/05/2012 through 01/30/2013                                                         Prostate 7800 cGy 40 sessions, seminal vesicles 5600 cGy 40 sessions                       . Shortness of breath   . Spondylosis, cervical    at C3-C4  . Varicose veins   . Weakness    history of     Past Surgical History:  Procedure Laterality Date  . BACK SURGERY  1995    2 ruptured discs in lower back / by Dr. Louanne Skye  . CARDIAC CATHETERIZATION  2005   EF estimated at 50% /  PROCEDURE:  Left heart catheterization, coronary and left ventricular angiography /  RAO view demonstrates mild LV enlargement  / mild hypokinesia anteroseptal with  overall low normal LV systolic function / Normal coronary anatomy  . HEMORRHOID SURGERY    . PROSTATE BIOPSY  08/25/2012   Gleason 3+3=6 PSA 7.80  .  RIGHT/LEFT HEART CATH AND CORONARY ANGIOGRAPHY N/A 11/13/2016   Procedure: Right/Left Heart Cath and Coronary Angiography;  Surgeon: [name] [name], MD;  Location: [place] [facility];  Service: Cardiovascular;  Laterality: N/A;  . TEE WITHOUT CARDIOVERSION N/A 12/08/2016   Procedure: TRANSESOPHAGEAL ECHOCARDIOGRAM (TEE);  Surgeon: Burnell Blanks, MD;  Location: [place];  Service: Open Heart Surgery;  Laterality: N/A;  . TONSILLECTOMY AND ADENOIDECTOMY    . TRANSCATHETER AORTIC VALVE REPLACEMENT, TRANSFEMORAL N/A 12/08/2016   Procedure: TRANSCATHETER AORTIC VALVE REPLACEMENT, TRANSFEMORAL;  Surgeon: Burnell Blanks, MD;  Location: [place];  Service: Open Heart Surgery;  Laterality: N/A;    Family History  Problem Relation Age of Onset  . Stroke Father   . Cancer Father   . Heart failure Mother   . Heart disease Mother   . Multiple sclerosis Sister   . Lung cancer Brother     Social History   Socioeconomic History  . Marital status: Married    Spouse name: Not on file  .  Number of children: 2  . Years of education: Not on file  . Highest education level: Not on file  Occupational History  . Occupation: Insurance    Comment: Financial planner  Tobacco Use  . Smoking status: Former Smoker    Packs/day: 1.00    Years: 2.00    Pack years: 2.00    Types: Cigarettes    Start date: 08/31/1949    Quit date: 09/01/1951    Years since quitting: 69.1  . Smokeless tobacco: Never Used  Vaping Use  . Vaping Use: Never used  Substance and Sexual Activity  . Alcohol use: No  . Drug use: No  . Sexual activity: Not on file  Other Topics Concern  . Not on file  Social History Narrative  . Not on file   Social Determinants of Health   Financial Resource Strain: Not on file  Food Insecurity: Not on file  Transportation Needs: Not on file  Physical Activity: Not on file  Stress: Not on file  Social Connections: Not on file  Intimate Partner Violence: Not on file    Outpatient Medications Prior to Visit  Medication Sig Dispense Refill  . aspirin EC 81 MG tablet Take 81 mg by mouth daily.    . CVS SENNA PLUS 8.6-50 MG tablet TAKE 2 TABLETS BY MOUTH 2 (TWO) TIMES DAILY. OR AS DIRECTED BY HOSPICE NURSE 120 tablet 1  . tamsulosin (FLOMAX) 0.4 MG CAPS capsule Take 1 capsule (0.4 mg total) by mouth at bedtime. 90 capsule 1   No facility-administered medications prior to visit.    Allergies  Allergen Reactions  . Lipitor [Atorvastatin Calcium] Other (See Comments)    MYALGIAS CRAMPS  . Lisinopril Cough    ROS Review of Systems  Constitutional: Positive for fatigue and unexpected weight change. Negative for chills and fever.  Respiratory: Negative for cough and shortness of breath.   Cardiovascular: Negative for chest pain.  Gastrointestinal: Negative for abdominal pain.  Genitourinary: Negative for dysuria.  Neurological: Negative for dizziness and headaches.  Hematological: Negative for adenopathy.  Psychiatric/Behavioral: Negative for agitation.       Objective:    Physical Exam Constitutional:      Comments: Alert somewhat frail-appearing 87 year old male  Cardiovascular:     Rate and Rhythm: Normal rate.  Pulmonary:     Effort: Pulmonary effort is normal.     Breath sounds: Normal breath sounds.  Abdominal:     Comments: We  had difficulty getting him up on table for exam secondary to his frailty  Musculoskeletal:     Cervical back: Neck supple.     Right lower leg: No edema.     Left lower leg: No edema.     BP 124/70   Pulse 91   Wt 137 lb (62.1 kg)   SpO2 97%   BMI 19.11 kg/m  Wt Readings from Last 3 Encounters:  09/23/20 137 lb (62.1 kg)  04/29/20 145 lb (65.8 kg)  04/10/20 144 lb (65.3 kg)     Health Maintenance Due  Topic Date Due  . COVID-19 Vaccine (1) Never done  . PNA vac Low Risk Adult (2 of 2 - PPSV23) 04/28/2017    There are no preventive care reminders to display for this patient.  Lab Results  Component Value Date   TSH 2.98 03/14/2019   Lab Results  Component Value Date   WBC 17.2 (H) 08/31/2019   HGB 11.7 (L) 08/31/2019   HCT 35.6 (L) 08/31/2019   MCV 87 08/31/2019   PLT 527 (H) 08/31/2019   Lab Results  Component Value Date   NA 133 (L) 08/31/2019   K 4.7 08/31/2019   CO2 21 08/31/2019   GLUCOSE 111 (H) 08/31/2019   BUN 24 08/31/2019   CREATININE 1.21 08/31/2019   BILITOT 0.7 02/21/2019   ALKPHOS 93 02/21/2019   AST 17 02/21/2019   ALT 8 02/21/2019   PROT 6.5 02/21/2019   ALBUMIN 4.5 02/21/2019   CALCIUM 8.7 08/31/2019   ANIONGAP 7 12/10/2016   GFR 72.53 02/09/2017   Lab Results  Component Value Date   CHOL 168 02/21/2019   Lab Results  Component Value Date   HDL 66 02/21/2019   Lab Results  Component Value Date   LDLCALC 89 02/21/2019   Lab Results  Component Value Date   TRIG 66 02/21/2019   Lab Results  Component Value Date   CHOLHDL 2.5 02/21/2019   Lab Results  Component Value Date   HGBA1C 5.8 (H) 12/02/2016      Assessment & Plan:    #1 multiple chronic problems as above presenting with some gradual weight loss.  He has had 7 pound weight loss since last August.  Adult failure to thrive.  He has increased fatigue which may be related to his overall debility.  -Check several labs including CBC, TSH, hepatic, basic metabolic panel, sed rate -We recommended nutritional supplements such as boost or Ensure daily to try to increase his overall calories and protein intake  #2 acute left-sided back pain. -Check urine dipstick -Watch for any radiculitis symptoms, numbness, weakness, or any recurrent or progressive pain.  We elected not to get any imaging today since there was no reported fall or injury.  No orders of the defined types were placed in this encounter.   Follow-up: Return in about 3 months (around 12/22/2020).    Carolann Littler, MD

## 2020-09-23 NOTE — Patient Instructions (Signed)
Consider nutrition supplement such as Boost or Ensure

## 2020-09-24 ENCOUNTER — Telehealth: Payer: Self-pay

## 2020-09-24 NOTE — Telephone Encounter (Signed)
Informed patient of results and recommendations. 

## 2020-09-24 NOTE — Telephone Encounter (Signed)
-----   Message from [name], MD sent at 09/23/2020  8:58 PM EST ----- Labs all stable.  Mild anemia which is chronic and stable.  Recommend protein- calorie supplement such as Boost.

## 2020-10-04 NOTE — Progress Notes (Signed)
Cardiology Office Note   Date:  10/07/2020   ID:  Jonathan Graham, DOB [birth year], MRN [id number]  PCP:  Kristian Covey, MD  Cardiologist: Peter Swaziland MD  Chief Complaint  Patient presents with  . Aortic Stenosis      History of Present Illness: Jonathan Graham is a 87 y.o. male who presents for follow up Aortic stenosis, CHF, LBBB, and HLD.    He has a history of known left bundle branch block and a history of hypercholesterolemia.  He has a history of prostate CA treated with radiation therapy. The patient has a history of  aortic stenosis.  W Echo demonstrated at least moderate AS. EF had dropped from 40-45% to 25-30%. As such a Dobutamine Echo and suggested he had low gradient severe AS. He subsequently underwent TAVR with Dr. Clifton James on 12/08/16 with # 29 Edwards Sapien valve. He did well with this. Recommended DAPT for 6 months. Repeat Echo 01/08/17 showed normal function of valve but persistently low EF 30-35%. He was seen in June 2020 with an episode of orthostatic hypotension. Has not been on any BP meds. Compression hose ordered but he was not able to tolerate since he said they aggravated his large varicosities.   He had marked decline last year. No appetite. Weight loss. Seen in December 2020 with hypotension and losartan discontinued. Seen by Dr Caryl Never in January and placed in hospice care. He was close to death and according to family even "saw the bright light" but then he seemed to rally and was doing much better. Appetite has is OK.[name] He feels pretty well now.Wife notes he is tired a lot and has to sit and often sleeps again after eating breakfast.    Past Medical History:  Diagnosis Date  . Arthritis   . Atypical chest pain    history of   . Back pain   . Cancer [facility]) 2014   prostate  . Cardiovascular stress test abnormal 04/25/2004   Abnormal adenosine Cardiolite study / evidence of inferoseptal ischemia &  an EF of 48% ""recommended that he undergo  cardiac catheterization""  . Cervical spine disease   . Dizziness    occasionally apon bending over  . Fatigue    history of   . Heart murmur   . History of hiatal hernia   . History of mononucleosis   . Hypercholesterolemia   . Left bundle branch block   . Pleurisy 07/2005  . S/P radiation therapy  12/05/2012 through 01/30/2013                                                         Prostate 7800 cGy 40 sessions, seminal vesicles 5600 cGy 40 sessions                       . Shortness of breath   . Spondylosis, cervical    at C3-C4  . Varicose veins   . Weakness    history of     Past Surgical History:  Procedure Laterality Date  . BACK SURGERY  1995    2 ruptured discs in lower back / by Dr. Otelia Sergeant  . CARDIAC CATHETERIZATION  2005   EF estimated at 50% /  PROCEDURE:  Left heart catheterization, coronary and left  ventricular angiography /  RAO view demonstrates mild LV enlargement  / mild hypokinesia anteroseptal with  overall low normal LV systolic function / Normal coronary anatomy  . HEMORRHOID SURGERY    . PROSTATE BIOPSY  08/25/2012   Gleason 3+3=6 PSA 7.80  . RIGHT/LEFT HEART CATH AND CORONARY ANGIOGRAPHY N/A 11/13/2016   Procedure: Right/Left Heart Cath and Coronary Angiography;  Surgeon: [name] [name], MD;  Location: [facility];  Service: Cardiovascular;  Laterality: N/A;  . TEE WITHOUT CARDIOVERSION N/A 12/08/2016   Procedure: TRANSESOPHAGEAL ECHOCARDIOGRAM (TEE);  Surgeon: Burnell Blanks, MD;  Location: [place];  Service: Open Heart Surgery;  Laterality: N/A;  . TONSILLECTOMY AND ADENOIDECTOMY    . TRANSCATHETER AORTIC VALVE REPLACEMENT, TRANSFEMORAL N/A 12/08/2016   Procedure: TRANSCATHETER AORTIC VALVE REPLACEMENT, TRANSFEMORAL;  Surgeon: Burnell Blanks, MD;  Location: [place];  Service: Open Heart Surgery;  Laterality: N/A;     Current Outpatient Medications  Medication Sig Dispense Refill  . aspirin EC 81 MG tablet Take 81 mg by mouth daily.     . CVS SENNA PLUS 8.6-50 MG tablet TAKE 2 TABLETS BY MOUTH 2 (TWO) TIMES DAILY. OR AS DIRECTED BY HOSPICE NURSE 120 tablet 1  . tamsulosin (FLOMAX) 0.4 MG CAPS capsule Take 1 capsule (0.4 mg total) by mouth at bedtime. 90 capsule 1   No current facility-administered medications for this visit.    Allergies:   Lipitor [atorvastatin calcium] and Lisinopril    Social History:  The patient  reports that he quit smoking about 69 years ago. His smoking use included cigarettes. He started smoking about 71 years ago. He has a 2.00 pack-year smoking history. He has never used smokeless tobacco. He reports that he does not drink alcohol and does not use drugs.   Family History:  The patient's family history includes Cancer in his father; Heart disease in his mother; Heart failure in his mother; Lung cancer in his brother; Multiple sclerosis in his sister; Stroke in his father.    ROS:  Please see the history of present illness. Otherwise, review of systems are positive for none.   All other systems are reviewed and negative.    PHYSICAL EXAM: VS:  BP 118/60   Pulse 68   Ht 5\' 9"  (1.753 m)   Wt 137 lb 12.8 oz (62.5 kg)   BMI 20.35 kg/m  , BMI Body mass index is 20.35 kg/m. GENERAL:  Well appearing, elderly WM in NAD, seen in a wheelchair.  HEENT:  PERRL, EOMI, sclera are clear. Oropharynx is clear. NECK:  No jugular venous distention, carotid upstroke brisk and symmetric, no bruits, no thyromegaly or adenopathy LUNGS:  Clear to auscultation bilaterally CHEST:  Unremarkable HEART:  RRR,  PMI not displaced or sustained,S1 and S2 within normal limits, no S3, no S4: no clicks, no rubs, soft 1/6 systolic murmur RUSB. ABD:  Soft, nontender. BS +, no masses or bruits. No hepatomegaly, no splenomegaly EXT:  2 + pulses throughout, no edema, no cyanosis no clubbing SKIN:  Warm and dry.  No rashes NEURO:  Alert and oriented x 3. Cranial nerves II through XII intact. PSYCH:  Cognitively  intact    EKG:  EKG is  ordered today. NSR with PVCs. Rate 68. LBBB. I have personally reviewed and interpreted this study.    Recent Labs: 09/23/2020: ALT 9; BUN 39; Creatinine, Ser 1.27; Hemoglobin 12.8; Platelets 219.0; Potassium 4.8; Sodium 138; TSH 2.63    Lipid Panel    Component  Value Date/Time   CHOL 168 02/21/2019 0846   TRIG 66 02/21/2019 0846   HDL 66 02/21/2019 0846   CHOLHDL 2.5 02/21/2019 0846   CHOLHDL 2.7 09/24/2016 0803   VLDL 11 09/24/2016 0803   LDLCALC 89 02/21/2019 0846      Wt Readings from Last 3 Encounters:  10/07/20 137 lb 12.8 oz (62.5 kg)  09/23/20 137 lb (62.1 kg)  04/29/20 145 lb (65.8 kg)     Cardiac cath March 2018: Procedures   Right/Left Heart Cath and Coronary Angiography  Conclusion     Ost 1st Mrg to 1st Mrg lesion, 50 %stenosed.  There is moderate to severe left ventricular systolic dysfunction.  LV end diastolic pressure is normal.  The left ventricular ejection fraction is 25-35% by visual estimate.  LV end diastolic pressure is normal.  There is moderate aortic valve stenosis.   1. Mild nonobstructive CAD 2. Normal right heart pressures 3. Normal LV filling pressures  4. Moderate Aortic stenosis with mean gradient of 26 mm Hg. AV area may be underestimated due to low EF.   Plan: consider AVR/TAVR.     Echo 01/08/17: Study Conclusions  - Left ventricle: The cavity size was normal. Wall thickness was   normal. Systolic function was moderately to severely reduced. The   estimated ejection fraction was in the range of 30% to 35%. Wall   motion was normal; there were no regional wall motion   abnormalities. Doppler parameters are consistent with abnormal   left ventricular relaxation (grade 1 diastolic dysfunction). - Ventricular septum: Septal motion showed abnormal function and   dyssynergy. - Aortic valve: A TAVR bioprosthesis was present and functioning   normally. Mean gradient (S): 12 mm Hg. Peak gradient  (S): 26 mm   Hg. - Mitral valve: Calcified annulus. - Left atrium: The atrium was mildly dilated. - Pulmonary arteries: Systolic pressure was moderately increased.   PA peak pressure: 50 mm Hg (S).  Impressions:  - Compared to the prior study, there has been no significant   interval change.  Echo 12/13/17: Study Conclusions  - Left ventricle: The cavity size was normal. Wall thickness was   normal. Septal-lateral dyssynchrony with diffuse hypokinesis.   Systolic function was moderately to severely reduced. The   estimated ejection fraction was in the range of 30% to 35%.   Doppler parameters are consistent with abnormal left ventricular   relaxation (grade 1 diastolic dysfunction). - Aortic valve: Bioprosthetic aortic valve s/p TAVR. Trivial   peri-valvular regurgitation. No significant stenosis. Mean   gradient (S): 12 mm Hg. Valve area (VTI): 1.93 cm^2. - Mitral valve: Mildly calcified annulus. There was mild   regurgitation. - Left atrium: The atrium was mildly dilated. - Right ventricle: The cavity size was normal. Systolic function   was normal. - Right atrium: The atrium was mildly dilated. - Tricuspid valve: Peak RV-RA gradient (S): 26 mm Hg. - Pulmonary arteries: PA peak pressure: 29 mm Hg (S). - Inferior vena cava: The vessel was normal in size. The   respirophasic diameter changes were in the normal range (>= 50%),   consistent with normal central venous pressure.  Impressions:  - Normal LV size with EF 30-35%. Diffuse hypokinesis with   septal-lateral dyssynchrony. Normal RV size and systolic   function. Bioprosthetic aortic valve s/p TAVR with trivial AI, no   significant stenosis.   ASSESSMENT AND PLAN:  1. Severe  aortic stenosis- s/p TAVR 2018.  Echo in July 2020  showed good  valve function.  Continue ASA indefinitely. He is asymptomatic. 2. Hypercholesterolemia. Given that he is 73 and  had a near death experience last year we will forgo statin  therapy. 3. Prostate cancer treated with radiation therapy 4. LBBB chronic.  5. Nonischemic cardiomyopathy with EF 30-35%. On no medication due to hypotension. No overt CHF. 6. History of orthostatic hypotension. Improved off meds 7. Chronic tremor. 8. Chronic fatigue. Labs OK. Suspect this is mostly related to advanced age.  Follow  Up in 6 months.   Current medicines are reviewed at length with the patient today.  The patient does not have concerns regarding medicines.  The following changes have been made: none  Labs/ tests ordered today include:   No orders of the defined types were placed in this encounter.    Signed, Peter [name] MD, [facility]   10/07/2020 2:57 PM    [facility]

## 2020-10-07 ENCOUNTER — Ambulatory Visit: Payer: Medicare Other | Admitting: Cardiology

## 2020-10-07 ENCOUNTER — Encounter: Payer: Self-pay | Admitting: Cardiology

## 2020-10-07 ENCOUNTER — Other Ambulatory Visit: Payer: Self-pay

## 2020-10-07 VITALS — BP 118/60 | HR 68 | Ht 69.0 in | Wt 137.8 lb

## 2020-10-07 DIAGNOSIS — I5022 Chronic systolic (congestive) heart failure: Secondary | ICD-10-CM

## 2020-10-07 DIAGNOSIS — I447 Left bundle-branch block, unspecified: Secondary | ICD-10-CM

## 2020-10-07 DIAGNOSIS — I951 Orthostatic hypotension: Secondary | ICD-10-CM | POA: Diagnosis not present

## 2020-10-07 DIAGNOSIS — Z952 Presence of prosthetic heart valve: Secondary | ICD-10-CM

## 2020-10-07 DIAGNOSIS — I350 Nonrheumatic aortic (valve) stenosis: Secondary | ICD-10-CM

## 2020-11-22 DIAGNOSIS — M25561 Pain in right knee: Secondary | ICD-10-CM | POA: Diagnosis not present

## 2020-11-26 ENCOUNTER — Other Ambulatory Visit: Payer: Self-pay

## 2020-11-26 ENCOUNTER — Ambulatory Visit (INDEPENDENT_AMBULATORY_CARE_PROVIDER_SITE_OTHER): Payer: Medicare Other

## 2020-11-26 DIAGNOSIS — Z0000 Encounter for general adult medical examination without abnormal findings: Secondary | ICD-10-CM | POA: Diagnosis not present

## 2020-11-26 NOTE — Patient Instructions (Addendum)
[name] , Thank you for taking time to come for your Medicare Wellness Visit. I appreciate your ongoing commitment to your health goals. Please review the following plan we discussed and let me know if I can assist you in the future.   Screening recommendations/referrals: Colonoscopy: No longer required  Recommended yearly ophthalmology/optometry visit for glaucoma screening and checkup Recommended yearly dental visit for hygiene and checkup  Vaccinations: Influenza vaccine: Up to date Pneumococcal vaccine: Due and discussed Tdap vaccine: Up to date Shingles vaccine: Shingrix discussed. Please contact your pharmacy for coverage information.    Covid-19: 1st dose 12/15/19 will bring in card at next visit   Advanced directives: Please bring a copy of your health care power of attorney and living will to the office at your convenience.  Conditions/risks identified: None at this time  Next appointment: Follow up in one year for your annual wellness visit.   Preventive Care 85 Years and Older, Male Preventive care refers to lifestyle choices and visits with your health care provider that can promote health and wellness. What does preventive care include?  A yearly physical exam. This is also called an annual well check.  Dental exams once or twice a year.  Routine eye exams. Ask your health care provider how often you should have your eyes checked.  Personal lifestyle choices, including:  Daily care of your teeth and gums.  Regular physical activity.  Eating a healthy diet.  Avoiding tobacco and drug use.  Limiting alcohol use.  Practicing safe sex.  Taking low doses of aspirin every day.  Taking vitamin and mineral supplements as recommended by your health care provider. What happens during an annual well check? The services and screenings done by your health care provider during your annual well check will depend on your age, overall health, lifestyle risk factors,  and family history of disease. Counseling  Your health care provider may ask you questions about your:  Alcohol use.  Tobacco use.  Drug use.  Emotional well-being.  Home and relationship well-being.  Sexual activity.  Eating habits.  History of falls.  Memory and ability to understand (cognition).  Work and work Statistician. Screening  You may have the following tests or measurements:  Height, weight, and BMI.  Blood pressure.  Lipid and cholesterol levels. These may be checked every 5 years, or more frequently if you are over 56 years old.  Skin check.  Lung cancer screening. You may have this screening every year starting at age 54 if you have a 30-pack-year history of smoking and currently smoke or have quit within the past 15 years.  Fecal occult blood test (FOBT) of the stool. You may have this test every year starting at age 55.  Flexible sigmoidoscopy or colonoscopy. You may have a sigmoidoscopy every 5 years or a colonoscopy every 10 years starting at age 76.  Prostate cancer screening. Recommendations will vary depending on your family history and other risks.  Hepatitis C blood test.  Hepatitis B blood test.  Sexually transmitted disease (STD) testing.  Diabetes screening. This is done by checking your blood sugar (glucose) after you have not eaten for a while (fasting). You may have this done every 1-3 years.  Abdominal aortic aneurysm (AAA) screening. You may need this if you are a current or former smoker.  Osteoporosis. You may be screened starting at age 53 if you are at high risk. Talk with your health care provider about your test results, treatment options, and if  necessary, the need for more tests. Vaccines  Your health care provider may recommend certain vaccines, such as:  Influenza vaccine. This is recommended every year.  Tetanus, diphtheria, and acellular pertussis (Tdap, Td) vaccine. You may need a Td booster every 10  years.  Zoster vaccine. You may need this after age 20.  Pneumococcal 13-valent conjugate (PCV13) vaccine. One dose is recommended after age 62.  Pneumococcal polysaccharide (PPSV23) vaccine. One dose is recommended after age 70. Talk to your health care provider about which screenings and vaccines you need and how often you need them. This information is not intended to replace advice given to you by your health care provider. Make sure you discuss any questions you have with your health care provider. Document Released: 09/13/2015 Document Revised: 05/06/2016 Document Reviewed: 06/18/2015 Elsevier Interactive Patient Education  2017 [place] Prevention in the Home Falls can cause injuries. They can happen to people of all ages. There are many things you can do to make your home safe and to help prevent falls. What can I do on the outside of my home?  Regularly fix the edges of walkways and driveways and fix any cracks.  Remove anything that might make you trip as you walk through a door, such as a raised step or threshold.  Trim any bushes or trees on the path to your home.  Use bright outdoor lighting.  Clear any walking paths of anything that might make someone trip, such as rocks or tools.  Regularly check to see if handrails are loose or broken. Make sure that both sides of any steps have handrails.  Any raised decks and porches should have guardrails on the edges.  Have any leaves, snow, or ice cleared regularly.  Use sand or salt on walking paths during winter.  Clean up any spills in your garage right away. This includes oil or grease spills. What can I do in the bathroom?  Use night lights.  Install grab bars by the toilet and in the tub and shower. Do not use towel bars as grab bars.  Use non-skid mats or decals in the tub or shower.  If you need to sit down in the shower, use a plastic, non-slip stool.  Keep the floor dry. Clean up any water that  spills on the floor as soon as it happens.  Remove soap buildup in the tub or shower regularly.  Attach bath mats securely with double-sided non-slip rug tape.  Do not have throw rugs and other things on the floor that can make you trip. What can I do in the bedroom?  Use night lights.  Make sure that you have a light by your bed that is easy to reach.  Do not use any sheets or blankets that are too big for your bed. They should not hang down onto the floor.  Have a firm chair that has side arms. You can use this for support while you get dressed.  Do not have throw rugs and other things on the floor that can make you trip. What can I do in the kitchen?  Clean up any spills right away.  Avoid walking on wet floors.  Keep items that you use a lot in easy-to-reach places.  If you need to reach something above you, use a strong step stool that has a grab bar.  Keep electrical cords out of the way.  Do not use floor polish or wax that makes floors slippery. If you must  use wax, use non-skid floor wax.  Do not have throw rugs and other things on the floor that can make you trip. What can I do with my stairs?  Do not leave any items on the stairs.  Make sure that there are handrails on both sides of the stairs and use them. Fix handrails that are broken or loose. Make sure that handrails are as long as the stairways.  Check any carpeting to make sure that it is firmly attached to the stairs. Fix any carpet that is loose or worn.  Avoid having throw rugs at the top or bottom of the stairs. If you do have throw rugs, attach them to the floor with carpet tape.  Make sure that you have a light switch at the top of the stairs and the bottom of the stairs. If you do not have them, ask someone to add them for you. What else can I do to help prevent falls?  Wear shoes that:  Do not have high heels.  Have rubber bottoms.  Are comfortable and fit you well.  Are closed at the  toe. Do not wear sandals.  If you use a stepladder:  Make sure that it is fully opened. Do not climb a closed stepladder.  Make sure that both sides of the stepladder are locked into place.  Ask someone to hold it for you, if possible.  Clearly mark and make sure that you can see:  Any grab bars or handrails.  First and last steps.  Where the edge of each step is.  Use tools that help you move around (mobility aids) if they are needed. These include:  Canes.  Walkers.  Scooters.  Crutches.  Turn on the lights when you go into a dark area. Replace any light bulbs as soon as they burn out.  Set up your furniture so you have a clear path. Avoid moving your furniture around.  If any of your floors are uneven, fix them.  If there are any pets around you, be aware of where they are.  Review your medicines with your doctor. Some medicines can make you feel dizzy. This can increase your chance of falling. Ask your doctor what other things that you can do to help prevent falls. This information is not intended to replace advice given to you by your health care provider. Make sure you discuss any questions you have with your health care provider. Document Released: 06/13/2009 Document Revised: 01/23/2016 Document Reviewed: 09/21/2014 Elsevier Interactive Patient Education  2017 [employer].

## 2020-11-26 NOTE — Progress Notes (Signed)
Virtual Visit via Telephone Note  I connected with  Jonathan Graham on 11/26/20 at  1:00 PM EDT by telephone and verified that I am speaking with the correct person using two identifiers.  Medicare Annual Wellness visit completed telephonically due to Covid-19 pandemic.   Persons participating in this call: This Health Coach and this patient and wife Jonathan Graham  Location: Patient: Home Provider: Office   I discussed the limitations, risks, security and privacy concerns of performing an evaluation and management service by telephone and the availability of in person appointments. The patient expressed understanding and agreed to proceed.  Unable to perform video visit due to video visit attempted and failed and/or patient does not have video capability.   Some vital signs may be absent or patient reported.   Jonathan Brace, LPN    Subjective:   Jonathan Graham is a 87 y.o. male who presents for Medicare Annual/Subsequent preventive examination.  Review of Systems     Cardiac Risk Factors include: advanced age (>40men, >53 women)     Objective:    There were no vitals filed for this visit. There is no height or weight on file to calculate BMI.  Advanced Directives 11/26/2020 05/23/2018 05/18/2017 12/09/2016 12/02/2016 12/01/2016 11/13/2016  Does Patient Have a Medical Advance Directive? Yes Yes Yes Yes - Yes Yes  Type of Advance Directive Jonathan Graham;Living will Jonathan Graham;Living will Living will;Healthcare Power of Attorney Living will;Healthcare Power of Attorney  Does patient want to make changes to medical advance directive? - - - No - Patient declined No - Patient declined No - Patient declined No - Patient declined  Copy of [place] in Chart? No - copy requested - - - No - copy requested No - copy requested No - copy requested  Would patient like information on creating a medical advance  directive? - - - - - - -    Current Medications (verified) Outpatient Encounter Medications as of 11/26/2020  Medication Sig  . aspirin EC 81 MG tablet Take 81 mg by mouth daily.  . [facility] SENNA PLUS 8.6-50 MG tablet TAKE 2 TABLETS BY MOUTH 2 (TWO) TIMES DAILY. OR AS DIRECTED BY HOSPICE NURSE  . tamsulosin (FLOMAX) 0.4 MG CAPS capsule Take 1 capsule (0.4 mg total) by mouth at bedtime.   No facility-administered encounter medications on file as of 11/26/2020.    Allergies (verified) Lipitor [atorvastatin calcium] and Lisinopril   History: Past Medical History:  Diagnosis Date  . Arthritis   . Atypical chest pain    history of   . Back pain   . Cancer Jonathan Graham) 2014   prostate  . Cardiovascular stress test abnormal 04/25/2004   Abnormal adenosine Cardiolite study / evidence of inferoseptal ischemia &  an EF of 48% ""recommended that he undergo cardiac catheterization""  . Cervical spine disease   . Dizziness    occasionally apon bending over  . Fatigue    history of   . Heart murmur   . History of hiatal hernia   . History of mononucleosis   . Hypercholesterolemia   . Left bundle branch block   . Pleurisy 07/2005  . S/P radiation therapy  12/05/2012 through 01/30/2013  Prostate 7800 cGy 40 sessions, seminal vesicles 5600 cGy 40 sessions                       . Shortness of breath   . Spondylosis, cervical    at C3-C4  . Varicose veins   . Weakness    history of    Past Surgical History:  Procedure Laterality Date  . BACK SURGERY  1995    2 ruptured discs in lower back / by Dr. Louanne Skye  . CARDIAC CATHETERIZATION  2005   EF estimated at 50% /  PROCEDURE:  Left heart catheterization, coronary and left ventricular angiography /  RAO view demonstrates mild LV enlargement  / mild hypokinesia anteroseptal with  overall low normal LV systolic function / Normal coronary anatomy  . HEMORRHOID SURGERY    . PROSTATE BIOPSY   08/25/2012   Gleason 3+3=6 PSA 7.80  . RIGHT/LEFT HEART CATH AND CORONARY ANGIOGRAPHY N/A 11/13/2016   Procedure: Right/Left Heart Cath and Coronary Angiography;  Surgeon: [name] [name], MD;  Location: [facility];  Service: Cardiovascular;  Laterality: N/A;  . TEE WITHOUT CARDIOVERSION N/A 12/08/2016   Procedure: TRANSESOPHAGEAL ECHOCARDIOGRAM (TEE);  Surgeon: Burnell Blanks, MD;  Location: [facility];  Service: Open Heart Surgery;  Laterality: N/A;  . TONSILLECTOMY AND ADENOIDECTOMY    . TRANSCATHETER AORTIC VALVE REPLACEMENT, TRANSFEMORAL N/A 12/08/2016   Procedure: TRANSCATHETER AORTIC VALVE REPLACEMENT, TRANSFEMORAL;  Surgeon: Burnell Blanks, MD;  Location: [facility];  Service: Open Heart Surgery;  Laterality: N/A;   Family History  Problem Relation Age of Onset  . Stroke Father   . Cancer Father   . Heart failure Mother   . Heart disease Mother   . Multiple sclerosis Sister   . Lung cancer Brother    Social History   Socioeconomic History  . Marital status: Married    Spouse name: Not on file  . Number of children: 2  . Years of education: Not on file  . Highest education level: Not on file  Occupational History  . Occupation: Insurance    Comment: Financial planner  Tobacco Use  . Smoking status: Former Smoker    Packs/day: 1.00    Years: 2.00    Pack years: 2.00    Types: Cigarettes    Start date: 08/31/1949    Quit date: 09/01/1951    Years since quitting: 69.2  . Smokeless tobacco: Never Used  Vaping Use  . Vaping Use: Never used  Substance and Sexual Activity  . Alcohol use: No  . Drug use: No  . Sexual activity: Not on file  Other Topics Concern  . Not on file  Social History Narrative  . Not on file   Social Determinants of Health   Financial Resource Strain: Low Risk   . Difficulty of Paying Living Expenses: Not hard at all  Food Insecurity: No Food Insecurity  . Worried About Charity fundraiser in the Last Year: Never true  . Ran Out  of Food in the Last Year: Never true  Transportation Needs: No Transportation Needs  . Lack of Transportation (Medical): No  . Lack of Transportation (Non-Medical): No  Physical Activity: Inactive  . Days of Exercise per Week: 0 days  . Minutes of Exercise per Session: 0 min  Stress: No Stress Concern Present  . Feeling of Stress : Not at all  Social Connections: Socially Isolated  . Frequency of Communication with Friends and Family:  Once a week  . Frequency of Social Gatherings with Friends and Family: Once a week  . Attends Religious Services: Never  . Active Member of Clubs or Organizations: No  . Attends Archivist Meetings: Never  . Marital Status: Married    Tobacco Counseling Counseling given: Not Answered   Clinical Intake:  Pre-visit preparation completed: Yes  Pain : No/denies pain     BMI - recorded: 20.35 Nutritional Status: BMI of 19-24  Normal Nutritional Risks: None Diabetes: No  How often do you need to have someone help you when you read instructions, pamphlets, or other written materials from your doctor or pharmacy?: 1 - Never  Diabetic?No  Interpreter Needed?: No  Information entered by :: Charlott Rakes, LPN   Activities of Daily Living In your present state of health, do you have any difficulty performing the following activities: 11/26/2020  Hearing? Y  Vision? N  Difficulty concentrating or making decisions? Y  Walking or climbing stairs? Y  Comment avoid  Dressing or bathing? N  Doing errands, shopping? N  Preparing Food and eating ? Y  Using the Toilet? N  In the past six months, have you accidently leaked urine? Y  Comment occassional  Do you have problems with loss of bowel control? Y  Comment occassional  Managing your Medications? N  Managing your Finances? N  Housekeeping or managing your Housekeeping? N  Some recent data might be hidden    Patient Care Team: [name], MD as PCP - General (Family  Medicine) [name], [name], MD as PCP - Cardiology (Cardiology) Julianne Handler, NP as Nurse Practitioner [facility] and Palliative Medicine)  Indicate any recent Medical Services you may have received from other than [facility] providers in the past year (date may be approximate).     Assessment:   This is a routine wellness examination for [place].  Hearing/Vision screen  Hearing Screening   125Hz  250Hz  500Hz  1000Hz  2000Hz  3000Hz  4000Hz  6000Hz  8000Hz   Right ear:           Left ear:           Comments: Pt wears hearing aids  Vision Screening Comments: Pt follows up with Dr Sabra Heck For annual eye exams   Dietary issues and exercise activities discussed: Current Exercise Habits: The patient does not participate in regular exercise at present  Goals    . patient     Maintain health      . Patient Stated     Going to maintain your health     . Patient Stated      Depression Screen PHQ 2/9 Scores 11/26/2020 05/23/2018 05/18/2017 07/02/2014 06/16/2013  PHQ - 2 Score 0 0 0 0 0    Fall Risk Fall Risk  11/26/2020 05/23/2018 05/18/2017 07/02/2014 06/16/2013  Falls in the past year? 1 Yes No No No  Number falls in past yr: 1 2 or more - - -  Injury with Fall? 1 Yes - - -  Risk Factor Category  - High Fall Risk - - -  Risk for fall due to : Impaired balance/gait;Impaired mobility;Impaired vision;History of fall(s) Other (Comment) - - -  Risk for fall due to: Comment - decision making  - - -  Follow up Falls prevention discussed Education provided - - -  Comment - falls are safety risk - - -    FALL RISK PREVENTION PERTAINING TO THE HOME:  Any stairs in or around the home? Yes  If so,  are there any without handrails? No  Home free of loose throw rugs in walkways, pet beds, electrical cords, etc? Yes  Adequate lighting in your home to reduce risk of falls? Yes   ASSISTIVE DEVICES UTILIZED TO PREVENT FALLS:  Life alert? Yes  Use of a cane, walker or w/c? Yes  Grab bars in the bathroom? Yes   Shower chair or bench in shower? No  Elevated toilet seat or a handicapped toilet? No   TIMED UP AND GO:  Was the test performed? No     Cognitive Function: 6CIT declined at this time MMSE - Mini Mental State Exam 05/23/2018 05/18/2017  Not completed: (No Data) (No Data)        Immunizations Immunization History  Administered Date(s) Administered  . Fluad Quad(high Dose 65+) 06/28/2019, 08/28/2020  . Influenza Split 09/21/2011  . Influenza, High Dose Seasonal PF 06/26/2015, 06/17/2016, 06/17/2017, 06/13/2018, 06/27/2019  . Influenza,inj,Quad PF,6+ Mos 06/16/2013, 07/02/2014  . Pneumococcal Conjugate-13 04/28/2016  . Tdap 04/28/2016    TDAP status: Up to date  Flu Vaccine status: Up to date  Pneumococcal vaccine status: Due, Education has been provided regarding the importance of this vaccine. Advised may receive this vaccine at local pharmacy or Health Dept. Aware to provide a copy of the vaccination record if obtained from local pharmacy or Health Dept. Verbalized acceptance and understanding.  Covid-19 vaccine status: Completed vaccines  Qualifies for Shingles Vaccine? Yes   Zostavax completed No   Shingrix Completed?: No.    Education has been provided regarding the importance of this vaccine. Patient has been advised to call insurance company to determine out of pocket expense if they have not yet received this vaccine. Advised may also receive vaccine at local pharmacy or Health Dept. Verbalized acceptance and understanding.  Screening Tests Health Maintenance  Topic Date Due  . COVID-19 Vaccine (1) Never done  . PNA vac Low Risk Adult (2 of 2 - PPSV23) 04/28/2017  . TETANUS/TDAP  04/28/2026  . INFLUENZA VACCINE  Completed  . HPV VACCINES  Aged Out    Health Maintenance  Health Maintenance Due  Topic Date Due  . COVID-19 Vaccine (1) Never done  . PNA vac Low Risk Adult (2 of 2 - PPSV23) 04/28/2017    Colorectal cancer screening: No longer required.    Additional Screening:   Vision Screening: Recommended annual ophthalmology exams for early detection of glaucoma and other disorders of the eye. Is the patient up to date with their annual eye exam?  Yes  Who is the provider or what is the name of the office in which the patient attends annual eye exams? [facility] If pt is not established with a provider, would they like to be referred to a provider to establish care? No .   Dental Screening: Recommended annual dental exams for proper oral hygiene  Community Resource Referral / Chronic Care Management: CRR required this visit?  No   CCM required this visit?  No      Plan:     I have personally reviewed and noted the following in the patient's chart:   . Medical and social history . Use of alcohol, tobacco or illicit drugs  . Current medications and supplements . Functional ability and status . Nutritional status . Physical activity . Advanced directives . List of other physicians . Hospitalizations, surgeries, and ER visits in previous 12 months . Vitals . Screenings to include cognitive, depression, and falls . Referrals and appointments  In addition,  I have reviewed and discussed with patient certain preventive protocols, quality metrics, and best practice recommendations. A written personalized care plan for preventive services as well as general preventive health recommendations were provided to patient.     Jonathan Brace, LPN   8/59/0931   Nurse Notes: None

## 2020-12-13 ENCOUNTER — Telehealth: Payer: Self-pay

## 2020-12-13 NOTE — Telephone Encounter (Signed)
Follow up call placed to patient from volunteer check in. Spoke with patient's wife who shared that patient continues to have pain in his knee that is worsening. Wife expressed concerns about patient's ongoing constipation concerns. Currently taking Senna 2 tabs daily. Patient will also utilize prn Miralax days but it is too strong at times. Per Latoya NP, ok to add 1-2 more tabs per day to ease constipation. Will follow up to schedule visit in next few weeks.

## 2020-12-16 ENCOUNTER — Other Ambulatory Visit: Payer: Self-pay | Admitting: *Deleted

## 2020-12-16 ENCOUNTER — Telehealth: Payer: Self-pay | Admitting: Cardiology

## 2020-12-16 ENCOUNTER — Other Ambulatory Visit: Payer: Self-pay | Admitting: Cardiology

## 2020-12-16 MED ORDER — AMOXICILLIN 500 MG PO CAPS: ORAL_CAPSULE | ORAL | 0 refills | Status: AC

## 2020-12-16 NOTE — Telephone Encounter (Signed)
Patient's wife is calling to get refill on the prescription on amoxicilelin, patient wife stated he has a dentist on appt on tomorrow and would need the medication asap. So that he will be able to the the medication an hr before the procedure

## 2020-12-16 NOTE — Telephone Encounter (Signed)
Rx has been sent to the pharmacy electronically. ° °

## 2020-12-24 ENCOUNTER — Ambulatory Visit (INDEPENDENT_AMBULATORY_CARE_PROVIDER_SITE_OTHER): Payer: Medicare Other | Admitting: Family Medicine

## 2020-12-24 ENCOUNTER — Other Ambulatory Visit: Payer: Self-pay

## 2020-12-24 VITALS — BP 118/58 | HR 63 | Temp 98.2°F | Wt 142.9 lb

## 2020-12-24 DIAGNOSIS — R627 Adult failure to thrive: Secondary | ICD-10-CM

## 2020-12-24 DIAGNOSIS — K5900 Constipation, unspecified: Secondary | ICD-10-CM

## 2020-12-24 DIAGNOSIS — M1711 Unilateral primary osteoarthritis, right knee: Secondary | ICD-10-CM

## 2020-12-24 NOTE — Progress Notes (Signed)
Established Patient Office Visit  Subjective:  Patient ID: Jonathan Graham, male    DOB: 1927-12-10  Age: 87 y.o. MRN: [id number]  CC:  Chief Complaint  Patient presents with  . Follow-up    HPI Jonathan Graham presents for medical follow-up.  Generally doing fairly well.  He had weight loss last visit and labs were reassuring.  His weight is back up about 6 pounds today.  We advise either boost or Ensure but he is not taking these regularly.  His wife states he eats fairly regularly but is very picky with what he eats.  Has had some chronic intermittent constipation.  He takes stool softener laxative combination and recently started back taking 2 a day which seemed to help.  He had loose stools when taking MiraLAX.  Severe osteoarthritis of the knees especially right knee.  Very limited with activities.  No reported recent falls.  Does not do any regular exercises.  He does have some dementia issues and low motivation for activity.  He has BPH treated with Flomax.  Urine flow reportedly is good.  No recent burning with urination.  Remote history of prostate cancer.  Past Medical History:  Diagnosis Date  . Arthritis   . Atypical chest pain    history of   . Back pain   . Cancer [facility]) 2014   prostate  . Cardiovascular stress test abnormal 04/25/2004   Abnormal adenosine Cardiolite study / evidence of inferoseptal ischemia &  an EF of 48% ""recommended that he undergo cardiac catheterization""  . Cervical spine disease   . Dizziness    occasionally apon bending over  . Fatigue    history of   . Heart murmur   . History of hiatal hernia   . History of mononucleosis   . Hypercholesterolemia   . Left bundle branch block   . Pleurisy 07/2005  . S/P radiation therapy  12/05/2012 through 01/30/2013                                                         Prostate 7800 cGy 40 sessions, seminal vesicles 5600 cGy 40 sessions                       . Shortness of breath   .  Spondylosis, cervical    at C3-C4  . Varicose veins   . Weakness    history of     Past Surgical History:  Procedure Laterality Date  . BACK SURGERY  1995    2 ruptured discs in lower back / by Dr. Louanne Skye  . CARDIAC CATHETERIZATION  2005   EF estimated at 50% /  PROCEDURE:  Left heart catheterization, coronary and left ventricular angiography /  RAO view demonstrates mild LV enlargement  / mild hypokinesia anteroseptal with  overall low normal LV systolic function / Normal coronary anatomy  . HEMORRHOID SURGERY    . PROSTATE BIOPSY  08/25/2012   Gleason 3+3=6 PSA 7.80  . RIGHT/LEFT HEART CATH AND CORONARY ANGIOGRAPHY N/A 11/13/2016   Procedure: Right/Left Heart Cath and Coronary Angiography;  Surgeon: [name] [name], MD;  Location: [facility];  Service: Cardiovascular;  Laterality: N/A;  . TEE WITHOUT CARDIOVERSION N/A 12/08/2016   Procedure: TRANSESOPHAGEAL ECHOCARDIOGRAM (TEE);  Surgeon: Harrell Gave  Santina Evans, MD;  Location: [facility];  Service: Open Heart Surgery;  Laterality: N/A;  . TONSILLECTOMY AND ADENOIDECTOMY    . TRANSCATHETER AORTIC VALVE REPLACEMENT, TRANSFEMORAL N/A 12/08/2016   Procedure: TRANSCATHETER AORTIC VALVE REPLACEMENT, TRANSFEMORAL;  Surgeon: Burnell Blanks, MD;  Location: [facility];  Service: Open Heart Surgery;  Laterality: N/A;    Family History  Problem Relation Age of Onset  . Stroke Father   . Cancer Father   . Heart failure Mother   . Heart disease Mother   . Multiple sclerosis Sister   . Lung cancer Brother     Social History   Socioeconomic History  . Marital status: Married    Spouse name: Not on file  . Number of children: 2  . Years of education: Not on file  . Highest education level: Not on file  Occupational History  . Occupation: Insurance    Comment: Financial planner  Tobacco Use  . Smoking status: Former Smoker    Packs/day: 1.00    Years: 2.00    Pack years: 2.00    Types: Cigarettes    Start date: 08/31/1949    Quit  date: 09/01/1951    Years since quitting: 69.3  . Smokeless tobacco: Never Used  Vaping Use  . Vaping Use: Never used  Substance and Sexual Activity  . Alcohol use: No  . Drug use: No  . Sexual activity: Not on file  Other Topics Concern  . Not on file  Social History Narrative  . Not on file   Social Determinants of Health   Financial Resource Strain: Low Risk   . Difficulty of Paying Living Expenses: Not hard at all  Food Insecurity: No Food Insecurity  . Worried About Charity fundraiser in the Last Year: Never true  . Ran Out of Food in the Last Year: Never true  Transportation Needs: No Transportation Needs  . Lack of Transportation (Medical): No  . Lack of Transportation (Non-Medical): No  Physical Activity: Inactive  . Days of Exercise per Week: 0 days  . Minutes of Exercise per Session: 0 min  Stress: No Stress Concern Present  . Feeling of Stress : Not at all  Social Connections: Socially Isolated  . Frequency of Communication with Friends and Family: Once a week  . Frequency of Social Gatherings with Friends and Family: Once a week  . Attends Religious Services: Never  . Active Member of Clubs or Organizations: No  . Attends Archivist Meetings: Never  . Marital Status: Married  Human resources officer Violence: Not At Risk  . Fear of Current or Ex-Partner: No  . Emotionally Abused: No  . Physically Abused: No  . Sexually Abused: No    Outpatient Medications Prior to Visit  Medication Sig Dispense Refill  . amoxicillin (AMOXIL) 500 MG capsule Take 4 tablets(2000 mg) for dental work only 4 capsule 0  . aspirin EC 81 MG tablet Take 81 mg by mouth daily.    . [facility]S SENNA PLUS 8.6-50 MG tablet TAKE 2 TABLETS BY MOUTH 2 (TWO) TIMES DAILY. OR AS DIRECTED BY HOSPICE NURSE 120 tablet 1  . tamsulosin (FLOMAX) 0.4 MG CAPS capsule Take 1 capsule (0.4 mg total) by mouth at bedtime. 90 capsule 1   No facility-administered medications prior to visit.    Allergies   Allergen Reactions  . Lipitor [Atorvastatin Calcium] Other (See Comments)    MYALGIAS CRAMPS  . Lisinopril Cough    ROS Review of Systems  Constitutional: Negative for unexpected weight change.  Eyes: Negative for visual disturbance.  Respiratory: Negative for cough, chest tightness and shortness of breath.   Cardiovascular: Negative for chest pain and palpitations.  Genitourinary: Negative for dysuria.  Musculoskeletal: Positive for arthralgias.  Neurological: Negative for dizziness, syncope, weakness, light-headedness and headaches.      Objective:    Physical Exam Vitals reviewed.  Cardiovascular:     Rate and Rhythm: Normal rate and regular rhythm.  Pulmonary:     Effort: Pulmonary effort is normal.     Breath sounds: Normal breath sounds.  Musculoskeletal:     Right lower leg: No edema.     Left lower leg: No edema.     Comments: Multiple varicosities lower extremity but no pitting edema. He has pain with flexion and extension of the right knee.  Increased crepitation.  Neurological:     Mental Status: He is alert.     BP (!) 118/58 (BP Location: Left Arm, Patient Position: Sitting, Cuff Size: Normal)   Pulse 63   Temp 98.2 F (36.8 C) (Oral)   Wt 142 lb 14.4 oz (64.8 kg)   SpO2 95%   BMI 21.10 kg/m  Wt Readings from Last 3 Encounters:  12/24/20 142 lb 14.4 oz (64.8 kg)  10/07/20 137 lb 12.8 oz (62.5 kg)  09/23/20 137 lb (62.1 kg)     Health Maintenance Due  Topic Date Due  . COVID-19 Vaccine (1) Never done  . PNA vac Low Risk Adult (2 of 2 - PPSV23) 04/28/2017    There are no preventive care reminders to display for this patient.  Lab Results  Component Value Date   TSH 2.63 09/23/2020   Lab Results  Component Value Date   WBC 7.2 09/23/2020   HGB 12.8 (L) 09/23/2020   HCT 38.7 (L) 09/23/2020   MCV 87.6 09/23/2020   PLT 219.0 09/23/2020   Lab Results  Component Value Date   NA 138 09/23/2020   K 4.8 09/23/2020   CO2 27 09/23/2020    GLUCOSE 96 09/23/2020   BUN 39 (H) 09/23/2020   CREATININE 1.27 09/23/2020   BILITOT 0.7 09/23/2020   ALKPHOS 91 09/23/2020   AST 19 09/23/2020   ALT 9 09/23/2020   PROT 7.3 09/23/2020   ALBUMIN 4.5 09/23/2020   CALCIUM 9.7 09/23/2020   ANIONGAP 7 12/10/2016   GFR 49.55 (L) 09/23/2020   Lab Results  Component Value Date   CHOL 168 02/21/2019   Lab Results  Component Value Date   HDL 66 02/21/2019   Lab Results  Component Value Date   LDLCALC 89 02/21/2019   Lab Results  Component Value Date   TRIG 66 02/21/2019   Lab Results  Component Value Date   CHOLHDL 2.5 02/21/2019   Lab Results  Component Value Date   HGBA1C 5.8 (H) 12/02/2016      Assessment & Plan:   #1 severe osteoarthritis of the knees.  Limited mobility and high risk for falls.  -Strongly advised exercises to strengthen quadriceps.  Feel that he would benefit from home health physical therapy assessment.  Transfers are very difficult for them and feel that he should qualify for home health physical therapy.  We will set up. -Can take Tylenol as needed.  Avoid nonsteroidals with his age and other co-morbidities  #2 history of intermittent constipation -Continue with stool softener as needed.  May add MiraLAX as needed  #3 history of BPH symptomatically stable on Tamsulosin -Continue tamsulosin 0.4  mg nightly  #4 history of aortic stenosis with prior TAVR  #5 history of adult failure to thrive.  Weight is actually up some from last visit.  Again suggested consideration for nutritional supplements such as boost or Ensure.  No orders of the defined types were placed in this encounter.   Follow-up: Return in about 3 months (around 03/25/2021).    Carolann Littler, MD

## 2020-12-24 NOTE — Patient Instructions (Signed)
Osteoarthritis  Osteoarthritis is a type of arthritis. It refers to joint pain or joint disease. Osteoarthritis affects tissue that covers the ends of bones in joints (cartilage). Cartilage acts as a cushion between the bones and helps them move smoothly. Osteoarthritis occurs when cartilage in the joints gets worn down. Osteoarthritis is sometimes called "wear and tear" arthritis. Osteoarthritis is the most common form of arthritis. It often occurs in older people. It is a condition that gets worse over time. The joints most often affected by this condition are in the fingers, toes, hips, knees, and spine, including the neck and lower back. What are the causes? This condition is caused by the wearing down of cartilage that covers the ends of bones. What increases the risk? The following factors may make you more likely to develop this condition:  Being age 50 or older.  Obesity.  Overuse of joints.  Past injury of a joint.  Past surgery on a joint.  Family history of osteoarthritis. What are the signs or symptoms? The main symptoms of this condition are pain, swelling, and stiffness in the joint. Other symptoms may include:  An enlarged joint.  More pain and further damage caused by small pieces of bone or cartilage that break off and float inside of the joint.  Small deposits of bone (osteophytes) that grow on the edges of the joint.  A grating or scraping feeling inside the joint when you move it.  Popping or creaking sounds when you move.  Difficulty walking or exercising.  An inability to grip items, twist your hand(s), or control the movements of your hands and fingers. How is this diagnosed? This condition may be diagnosed based on:  Your medical history.  A physical exam.  Your symptoms.  X-rays of the affected joint(s).  Blood tests to rule out other types of arthritis. How is this treated? There is no cure for this condition, but treatment can help control  pain and improve joint function. Treatment may include a combination of therapies, such as:  Pain relief techniques, such as: ? Applying heat and cold to the joint. ? Massage. ? A form of talk therapy called cognitive behavioral therapy (CBT). This therapy helps you set goals and follow up on the changes that you make.  Medicines for pain and inflammation. The medicines can be taken by mouth or applied to the skin. They include: ? NSAIDs, such as ibuprofen. ? Prescription medicines. ? Strong anti-inflammatory medicines (corticosteroids). ? Certain nutritional supplements.  A prescribed exercise program. You may work with a physical therapist.  Assistive devices, such as a brace, wrap, splint, specialized glove, or cane.  A weight control plan.  Surgery, such as: ? An osteotomy. This is done to reposition the bones and relieve pain or to remove loose pieces of bone and cartilage. ? Joint replacement surgery. You may need this surgery if you have advanced osteoarthritis. Follow these instructions at home: Activity  Rest your affected joints as told by your health care provider.  Exercise as told by your health care provider. He or she may recommend specific types of exercise, such as: ? Strengthening exercises. These are done to strengthen the muscles that support joints affected by arthritis. ? Aerobic activities. These are exercises, such as brisk walking or water aerobics, that increase your heart rate. ? Range-of-motion activities. These help your joints move more easily. ? Balance and agility exercises. Managing pain, stiffness, and swelling  If directed, apply heat to the affected area as often   as told by your health care provider. Use the heat source that your health care provider recommends, such as a moist heat pack or a heating pad. ? If you have a removable assistive device, remove it as told by your health care provider. ? Place a towel between your skin and the heat  source. If your health care provider tells you to keep the assistive device on while you apply heat, place a towel between the assistive device and the heat source. ? Leave the heat on for 20-30 minutes. ? Remove the heat if your skin turns bright red. This is especially important if you are unable to feel pain, heat, or cold. You may have a greater risk of getting burned.  If directed, put ice on the affected area. To do this: ? If you have a removable assistive device, remove it as told by your health care provider. ? Put ice in a plastic bag. ? Place a towel between your skin and the bag. If your health care provider tells you to keep the assistive device on during icing, place a towel between the assistive device and the bag. ? Leave the ice on for 20 minutes, 2-3 times a day. ? Move your fingers or toes often to reduce stiffness and swelling. ? Raise (elevate) the injured area above the level of your heart while you are sitting or lying down.      General instructions  Take over-the-counter and prescription medicines only as told by your health care provider.  Maintain a healthy weight. Follow instructions from your health care provider for weight control.  Do not use any products that contain nicotine or tobacco, such as cigarettes, e-cigarettes, and chewing tobacco. If you need help quitting, ask your health care provider.  Use assistive devices as told by your health care provider.  Keep all follow-up visits as told by your health care provider. This is important. Where to find more information  National Institute of Arthritis and Musculoskeletal and Skin Diseases: www.niams.nih.gov  National Institute on Aging: www.nia.nih.gov  American College of Rheumatology: www.rheumatology.org Contact a health care provider if:  You have redness, swelling, or a feeling of warmth in a joint that gets worse.  You have a fever along with joint or muscle aches.  You develop a  rash.  You have trouble doing your normal activities. Get help right away if:  You have pain that gets worse and is not relieved by pain medicine. Summary  Osteoarthritis is a type of arthritis that affects tissue covering the ends of bones in joints (cartilage).  This condition is caused by the wearing down of cartilage that covers the ends of bones.  The main symptom of this condition is pain, swelling, and stiffness in the joint.  There is no cure for this condition, but treatment can help control pain and improve joint function. This information is not intended to replace advice given to you by your health care provider. Make sure you discuss any questions you have with your health care provider. Document Revised: 08/14/2019 Document Reviewed: 08/14/2019 Elsevier Patient Education  2021 Elsevier Inc.  

## 2021-01-16 DIAGNOSIS — E7800 Pure hypercholesterolemia, unspecified: Secondary | ICD-10-CM | POA: Diagnosis not present

## 2021-01-16 DIAGNOSIS — I447 Left bundle-branch block, unspecified: Secondary | ICD-10-CM | POA: Diagnosis not present

## 2021-01-16 DIAGNOSIS — Z9181 History of falling: Secondary | ICD-10-CM | POA: Diagnosis not present

## 2021-01-16 DIAGNOSIS — K5900 Constipation, unspecified: Secondary | ICD-10-CM | POA: Diagnosis not present

## 2021-01-16 DIAGNOSIS — I5022 Chronic systolic (congestive) heart failure: Secondary | ICD-10-CM | POA: Diagnosis not present

## 2021-01-16 DIAGNOSIS — Z87891 Personal history of nicotine dependence: Secondary | ICD-10-CM | POA: Diagnosis not present

## 2021-01-16 DIAGNOSIS — R627 Adult failure to thrive: Secondary | ICD-10-CM | POA: Diagnosis not present

## 2021-01-16 DIAGNOSIS — Z7982 Long term (current) use of aspirin: Secondary | ICD-10-CM | POA: Diagnosis not present

## 2021-01-16 DIAGNOSIS — M1711 Unilateral primary osteoarthritis, right knee: Secondary | ICD-10-CM | POA: Diagnosis not present

## 2021-01-16 DIAGNOSIS — Z792 Long term (current) use of antibiotics: Secondary | ICD-10-CM | POA: Diagnosis not present

## 2021-01-16 DIAGNOSIS — I951 Orthostatic hypotension: Secondary | ICD-10-CM | POA: Diagnosis not present

## 2021-01-16 DIAGNOSIS — D649 Anemia, unspecified: Secondary | ICD-10-CM | POA: Diagnosis not present

## 2021-01-16 DIAGNOSIS — M47812 Spondylosis without myelopathy or radiculopathy, cervical region: Secondary | ICD-10-CM | POA: Diagnosis not present

## 2021-01-16 DIAGNOSIS — I350 Nonrheumatic aortic (valve) stenosis: Secondary | ICD-10-CM | POA: Diagnosis not present

## 2021-01-24 DIAGNOSIS — I951 Orthostatic hypotension: Secondary | ICD-10-CM | POA: Diagnosis not present

## 2021-01-24 DIAGNOSIS — E7800 Pure hypercholesterolemia, unspecified: Secondary | ICD-10-CM | POA: Diagnosis not present

## 2021-01-24 DIAGNOSIS — Z9181 History of falling: Secondary | ICD-10-CM | POA: Diagnosis not present

## 2021-01-24 DIAGNOSIS — K5900 Constipation, unspecified: Secondary | ICD-10-CM | POA: Diagnosis not present

## 2021-01-24 DIAGNOSIS — I350 Nonrheumatic aortic (valve) stenosis: Secondary | ICD-10-CM | POA: Diagnosis not present

## 2021-01-24 DIAGNOSIS — I447 Left bundle-branch block, unspecified: Secondary | ICD-10-CM | POA: Diagnosis not present

## 2021-01-24 DIAGNOSIS — Z792 Long term (current) use of antibiotics: Secondary | ICD-10-CM | POA: Diagnosis not present

## 2021-01-24 DIAGNOSIS — I5022 Chronic systolic (congestive) heart failure: Secondary | ICD-10-CM | POA: Diagnosis not present

## 2021-01-24 DIAGNOSIS — R627 Adult failure to thrive: Secondary | ICD-10-CM | POA: Diagnosis not present

## 2021-01-24 DIAGNOSIS — M1711 Unilateral primary osteoarthritis, right knee: Secondary | ICD-10-CM | POA: Diagnosis not present

## 2021-01-24 DIAGNOSIS — Z87891 Personal history of nicotine dependence: Secondary | ICD-10-CM | POA: Diagnosis not present

## 2021-01-24 DIAGNOSIS — M47812 Spondylosis without myelopathy or radiculopathy, cervical region: Secondary | ICD-10-CM | POA: Diagnosis not present

## 2021-01-24 DIAGNOSIS — D649 Anemia, unspecified: Secondary | ICD-10-CM | POA: Diagnosis not present

## 2021-01-24 DIAGNOSIS — Z7982 Long term (current) use of aspirin: Secondary | ICD-10-CM | POA: Diagnosis not present

## 2021-01-31 DIAGNOSIS — Z792 Long term (current) use of antibiotics: Secondary | ICD-10-CM | POA: Diagnosis not present

## 2021-01-31 DIAGNOSIS — Z87891 Personal history of nicotine dependence: Secondary | ICD-10-CM | POA: Diagnosis not present

## 2021-01-31 DIAGNOSIS — K5900 Constipation, unspecified: Secondary | ICD-10-CM | POA: Diagnosis not present

## 2021-01-31 DIAGNOSIS — D649 Anemia, unspecified: Secondary | ICD-10-CM | POA: Diagnosis not present

## 2021-01-31 DIAGNOSIS — M47812 Spondylosis without myelopathy or radiculopathy, cervical region: Secondary | ICD-10-CM | POA: Diagnosis not present

## 2021-01-31 DIAGNOSIS — I350 Nonrheumatic aortic (valve) stenosis: Secondary | ICD-10-CM | POA: Diagnosis not present

## 2021-01-31 DIAGNOSIS — I5022 Chronic systolic (congestive) heart failure: Secondary | ICD-10-CM | POA: Diagnosis not present

## 2021-01-31 DIAGNOSIS — Z7982 Long term (current) use of aspirin: Secondary | ICD-10-CM | POA: Diagnosis not present

## 2021-01-31 DIAGNOSIS — Z9181 History of falling: Secondary | ICD-10-CM | POA: Diagnosis not present

## 2021-01-31 DIAGNOSIS — I951 Orthostatic hypotension: Secondary | ICD-10-CM | POA: Diagnosis not present

## 2021-01-31 DIAGNOSIS — R627 Adult failure to thrive: Secondary | ICD-10-CM | POA: Diagnosis not present

## 2021-01-31 DIAGNOSIS — I447 Left bundle-branch block, unspecified: Secondary | ICD-10-CM | POA: Diagnosis not present

## 2021-01-31 DIAGNOSIS — E7800 Pure hypercholesterolemia, unspecified: Secondary | ICD-10-CM | POA: Diagnosis not present

## 2021-01-31 DIAGNOSIS — M1711 Unilateral primary osteoarthritis, right knee: Secondary | ICD-10-CM | POA: Diagnosis not present

## 2021-02-15 DIAGNOSIS — M47812 Spondylosis without myelopathy or radiculopathy, cervical region: Secondary | ICD-10-CM | POA: Diagnosis not present

## 2021-02-15 DIAGNOSIS — K5900 Constipation, unspecified: Secondary | ICD-10-CM | POA: Diagnosis not present

## 2021-02-15 DIAGNOSIS — I447 Left bundle-branch block, unspecified: Secondary | ICD-10-CM | POA: Diagnosis not present

## 2021-02-15 DIAGNOSIS — Z9181 History of falling: Secondary | ICD-10-CM | POA: Diagnosis not present

## 2021-02-15 DIAGNOSIS — I5022 Chronic systolic (congestive) heart failure: Secondary | ICD-10-CM | POA: Diagnosis not present

## 2021-02-15 DIAGNOSIS — R627 Adult failure to thrive: Secondary | ICD-10-CM | POA: Diagnosis not present

## 2021-02-15 DIAGNOSIS — D649 Anemia, unspecified: Secondary | ICD-10-CM | POA: Diagnosis not present

## 2021-02-15 DIAGNOSIS — I350 Nonrheumatic aortic (valve) stenosis: Secondary | ICD-10-CM | POA: Diagnosis not present

## 2021-02-15 DIAGNOSIS — M1711 Unilateral primary osteoarthritis, right knee: Secondary | ICD-10-CM | POA: Diagnosis not present

## 2021-02-15 DIAGNOSIS — Z792 Long term (current) use of antibiotics: Secondary | ICD-10-CM | POA: Diagnosis not present

## 2021-02-15 DIAGNOSIS — E7800 Pure hypercholesterolemia, unspecified: Secondary | ICD-10-CM | POA: Diagnosis not present

## 2021-02-15 DIAGNOSIS — Z87891 Personal history of nicotine dependence: Secondary | ICD-10-CM | POA: Diagnosis not present

## 2021-02-15 DIAGNOSIS — I951 Orthostatic hypotension: Secondary | ICD-10-CM | POA: Diagnosis not present

## 2021-02-15 DIAGNOSIS — Z7982 Long term (current) use of aspirin: Secondary | ICD-10-CM | POA: Diagnosis not present

## 2021-02-18 DIAGNOSIS — M47812 Spondylosis without myelopathy or radiculopathy, cervical region: Secondary | ICD-10-CM | POA: Diagnosis not present

## 2021-02-18 DIAGNOSIS — Z9181 History of falling: Secondary | ICD-10-CM | POA: Diagnosis not present

## 2021-02-18 DIAGNOSIS — D649 Anemia, unspecified: Secondary | ICD-10-CM | POA: Diagnosis not present

## 2021-02-18 DIAGNOSIS — I951 Orthostatic hypotension: Secondary | ICD-10-CM | POA: Diagnosis not present

## 2021-02-18 DIAGNOSIS — E7800 Pure hypercholesterolemia, unspecified: Secondary | ICD-10-CM | POA: Diagnosis not present

## 2021-02-18 DIAGNOSIS — M1711 Unilateral primary osteoarthritis, right knee: Secondary | ICD-10-CM | POA: Diagnosis not present

## 2021-02-18 DIAGNOSIS — Z87891 Personal history of nicotine dependence: Secondary | ICD-10-CM | POA: Diagnosis not present

## 2021-02-18 DIAGNOSIS — K5900 Constipation, unspecified: Secondary | ICD-10-CM | POA: Diagnosis not present

## 2021-02-18 DIAGNOSIS — R627 Adult failure to thrive: Secondary | ICD-10-CM | POA: Diagnosis not present

## 2021-02-18 DIAGNOSIS — Z792 Long term (current) use of antibiotics: Secondary | ICD-10-CM | POA: Diagnosis not present

## 2021-02-18 DIAGNOSIS — I350 Nonrheumatic aortic (valve) stenosis: Secondary | ICD-10-CM | POA: Diagnosis not present

## 2021-02-18 DIAGNOSIS — I5022 Chronic systolic (congestive) heart failure: Secondary | ICD-10-CM | POA: Diagnosis not present

## 2021-02-18 DIAGNOSIS — Z7982 Long term (current) use of aspirin: Secondary | ICD-10-CM | POA: Diagnosis not present

## 2021-02-18 DIAGNOSIS — I447 Left bundle-branch block, unspecified: Secondary | ICD-10-CM | POA: Diagnosis not present

## 2021-02-20 DIAGNOSIS — M25561 Pain in right knee: Secondary | ICD-10-CM | POA: Diagnosis not present

## 2021-02-28 DIAGNOSIS — M1711 Unilateral primary osteoarthritis, right knee: Secondary | ICD-10-CM | POA: Diagnosis not present

## 2021-02-28 DIAGNOSIS — Z9181 History of falling: Secondary | ICD-10-CM | POA: Diagnosis not present

## 2021-02-28 DIAGNOSIS — K5900 Constipation, unspecified: Secondary | ICD-10-CM | POA: Diagnosis not present

## 2021-02-28 DIAGNOSIS — Z792 Long term (current) use of antibiotics: Secondary | ICD-10-CM | POA: Diagnosis not present

## 2021-02-28 DIAGNOSIS — Z87891 Personal history of nicotine dependence: Secondary | ICD-10-CM | POA: Diagnosis not present

## 2021-02-28 DIAGNOSIS — D649 Anemia, unspecified: Secondary | ICD-10-CM | POA: Diagnosis not present

## 2021-02-28 DIAGNOSIS — E7800 Pure hypercholesterolemia, unspecified: Secondary | ICD-10-CM | POA: Diagnosis not present

## 2021-02-28 DIAGNOSIS — M47812 Spondylosis without myelopathy or radiculopathy, cervical region: Secondary | ICD-10-CM | POA: Diagnosis not present

## 2021-02-28 DIAGNOSIS — Z7982 Long term (current) use of aspirin: Secondary | ICD-10-CM | POA: Diagnosis not present

## 2021-02-28 DIAGNOSIS — I350 Nonrheumatic aortic (valve) stenosis: Secondary | ICD-10-CM | POA: Diagnosis not present

## 2021-02-28 DIAGNOSIS — I5022 Chronic systolic (congestive) heart failure: Secondary | ICD-10-CM | POA: Diagnosis not present

## 2021-02-28 DIAGNOSIS — I951 Orthostatic hypotension: Secondary | ICD-10-CM | POA: Diagnosis not present

## 2021-02-28 DIAGNOSIS — R627 Adult failure to thrive: Secondary | ICD-10-CM | POA: Diagnosis not present

## 2021-02-28 DIAGNOSIS — I447 Left bundle-branch block, unspecified: Secondary | ICD-10-CM | POA: Diagnosis not present

## 2021-03-05 DIAGNOSIS — K5900 Constipation, unspecified: Secondary | ICD-10-CM | POA: Diagnosis not present

## 2021-03-05 DIAGNOSIS — M1711 Unilateral primary osteoarthritis, right knee: Secondary | ICD-10-CM | POA: Diagnosis not present

## 2021-03-05 DIAGNOSIS — I951 Orthostatic hypotension: Secondary | ICD-10-CM | POA: Diagnosis not present

## 2021-03-05 DIAGNOSIS — R627 Adult failure to thrive: Secondary | ICD-10-CM | POA: Diagnosis not present

## 2021-03-05 DIAGNOSIS — Z792 Long term (current) use of antibiotics: Secondary | ICD-10-CM | POA: Diagnosis not present

## 2021-03-05 DIAGNOSIS — I447 Left bundle-branch block, unspecified: Secondary | ICD-10-CM | POA: Diagnosis not present

## 2021-03-05 DIAGNOSIS — Z9181 History of falling: Secondary | ICD-10-CM | POA: Diagnosis not present

## 2021-03-05 DIAGNOSIS — Z7982 Long term (current) use of aspirin: Secondary | ICD-10-CM | POA: Diagnosis not present

## 2021-03-05 DIAGNOSIS — E7800 Pure hypercholesterolemia, unspecified: Secondary | ICD-10-CM | POA: Diagnosis not present

## 2021-03-05 DIAGNOSIS — M47812 Spondylosis without myelopathy or radiculopathy, cervical region: Secondary | ICD-10-CM | POA: Diagnosis not present

## 2021-03-05 DIAGNOSIS — Z87891 Personal history of nicotine dependence: Secondary | ICD-10-CM | POA: Diagnosis not present

## 2021-03-05 DIAGNOSIS — D649 Anemia, unspecified: Secondary | ICD-10-CM | POA: Diagnosis not present

## 2021-03-05 DIAGNOSIS — I350 Nonrheumatic aortic (valve) stenosis: Secondary | ICD-10-CM | POA: Diagnosis not present

## 2021-03-05 DIAGNOSIS — I5022 Chronic systolic (congestive) heart failure: Secondary | ICD-10-CM | POA: Diagnosis not present

## 2021-03-12 DIAGNOSIS — I5022 Chronic systolic (congestive) heart failure: Secondary | ICD-10-CM | POA: Diagnosis not present

## 2021-03-12 DIAGNOSIS — M47812 Spondylosis without myelopathy or radiculopathy, cervical region: Secondary | ICD-10-CM | POA: Diagnosis not present

## 2021-03-12 DIAGNOSIS — D649 Anemia, unspecified: Secondary | ICD-10-CM | POA: Diagnosis not present

## 2021-03-12 DIAGNOSIS — E7800 Pure hypercholesterolemia, unspecified: Secondary | ICD-10-CM | POA: Diagnosis not present

## 2021-03-12 DIAGNOSIS — R627 Adult failure to thrive: Secondary | ICD-10-CM | POA: Diagnosis not present

## 2021-03-12 DIAGNOSIS — I951 Orthostatic hypotension: Secondary | ICD-10-CM | POA: Diagnosis not present

## 2021-03-12 DIAGNOSIS — K5900 Constipation, unspecified: Secondary | ICD-10-CM | POA: Diagnosis not present

## 2021-03-12 DIAGNOSIS — Z87891 Personal history of nicotine dependence: Secondary | ICD-10-CM | POA: Diagnosis not present

## 2021-03-12 DIAGNOSIS — Z9181 History of falling: Secondary | ICD-10-CM | POA: Diagnosis not present

## 2021-03-12 DIAGNOSIS — I447 Left bundle-branch block, unspecified: Secondary | ICD-10-CM | POA: Diagnosis not present

## 2021-03-12 DIAGNOSIS — Z792 Long term (current) use of antibiotics: Secondary | ICD-10-CM | POA: Diagnosis not present

## 2021-03-12 DIAGNOSIS — Z7982 Long term (current) use of aspirin: Secondary | ICD-10-CM | POA: Diagnosis not present

## 2021-03-12 DIAGNOSIS — M1711 Unilateral primary osteoarthritis, right knee: Secondary | ICD-10-CM | POA: Diagnosis not present

## 2021-03-12 DIAGNOSIS — I350 Nonrheumatic aortic (valve) stenosis: Secondary | ICD-10-CM | POA: Diagnosis not present

## 2021-03-18 ENCOUNTER — Ambulatory Visit (INDEPENDENT_AMBULATORY_CARE_PROVIDER_SITE_OTHER): Payer: Medicare Other | Admitting: Family Medicine

## 2021-03-18 ENCOUNTER — Other Ambulatory Visit: Payer: Self-pay

## 2021-03-18 ENCOUNTER — Encounter: Payer: Self-pay | Admitting: Family Medicine

## 2021-03-18 VITALS — BP 118/60 | HR 71 | Temp 97.8°F

## 2021-03-18 DIAGNOSIS — K5900 Constipation, unspecified: Secondary | ICD-10-CM

## 2021-03-18 NOTE — Patient Instructions (Signed)
Consider getting back on the Senokot S stool softener.   Increase high fiber foods-  eg high fiber cereals, fruits, vegetables, beans, whole grain breads.

## 2021-03-18 NOTE — Progress Notes (Signed)
Established Patient Office Visit  Subjective:  Patient ID: Jonathan Graham, male    DOB: 1928-04-17  Age: 87 y.o. MRN: [id number]  CC:  Chief Complaint  Patient presents with   Constipation    Very difficult to have a BM, feels like he needs to go but nothing happens. Very bad stomach and side pain    HPI [name] presents for intermittent constipation.  Wife states his bowel movements are very irregular.  He is still going fairly often- in fact, had 1 bowel movement earlier here today.  Sunday he seemed to have some increased frequency of stools but very small pellet-like stools.  He frequently has the urge to go but frequently is unable to go.  He has cognitive issues with dementia and she states he gets very frustrated with this.  Occasional leakage.  No fever.  No bloody stools.  Very immobile generally.  Ambulates with a cane but does not do a lot of ambulation.  She thinks he is drinking a fair amount of fluids.  Does not eat a lot.  She states that he takes senna occasionally and has taken MiraLAX but had watery stools with that.  He had TSH back in January of this year which was normal.  Only regular medication is Flomax.  Does not take any anticholinergics.  Past Medical History:  Diagnosis Date   Arthritis    Atypical chest pain    history of    Back pain    Cancer ([place]) 2014   prostate   Cardiovascular stress test abnormal 04/25/2004   Abnormal adenosine Cardiolite study / evidence of inferoseptal ischemia &  an EF of 48% ""recommended that he undergo cardiac catheterization""   Cervical spine disease    Dizziness    occasionally apon bending over   Fatigue    history of    Heart murmur    History of hiatal hernia    History of mononucleosis    Hypercholesterolemia    Left bundle branch block    Pleurisy 07/2005   S/P radiation therapy  12/05/2012 through 01/30/2013                                                         Prostate 7800 cGy 40 sessions,  seminal vesicles 5600 cGy 40 sessions                        Shortness of breath    Spondylosis, cervical    at C3-C4   Varicose veins    Weakness    history of     Past Surgical History:  Procedure Laterality Date   BACK SURGERY  1995    2 ruptured discs in lower back / by Dr. Louanne Skye   CARDIAC CATHETERIZATION  2005   EF estimated at 50% /  PROCEDURE:  Left heart catheterization, coronary and left ventricular angiography /  RAO view demonstrates mild LV enlargement  / mild hypokinesia anteroseptal with  overall low normal LV systolic function / Normal coronary anatomy   HEMORRHOID SURGERY     PROSTATE BIOPSY  08/25/2012   Gleason 3+3=6 PSA 7.80   RIGHT/LEFT HEART CATH AND CORONARY ANGIOGRAPHY N/A 11/13/2016   Procedure: Right/Left Heart Cath and Coronary Angiography;  Surgeon: [name] [name],  MD;  Location: [facility];  Service: Cardiovascular;  Laterality: N/A;   TEE WITHOUT CARDIOVERSION N/A 12/08/2016   Procedure: TRANSESOPHAGEAL ECHOCARDIOGRAM (TEE);  Surgeon: Burnell Blanks, MD;  Location: [facility];  Service: Open Heart Surgery;  Laterality: N/A;   TONSILLECTOMY AND ADENOIDECTOMY     TRANSCATHETER AORTIC VALVE REPLACEMENT, TRANSFEMORAL N/A 12/08/2016   Procedure: TRANSCATHETER AORTIC VALVE REPLACEMENT, TRANSFEMORAL;  Surgeon: Burnell Blanks, MD;  Location: [facility];  Service: Open Heart Surgery;  Laterality: N/A;    Family History  Problem Relation Age of Onset   Stroke Father    Cancer Father    Heart failure Mother    Heart disease Mother    Multiple sclerosis Sister    Lung cancer Brother     Social History   Socioeconomic History   Marital status: Married    Spouse name: Not on file   Number of children: 2   Years of education: Not on file   Highest education level: Not on file  Occupational History   Occupation: Insurance    Comment: Financial planner  Tobacco Use   Smoking status: Former    Packs/day: 1.00    Years: 2.00    Pack years:  2.00    Types: Cigarettes    Start date: 08/31/1949    Quit date: 09/01/1951    Years since quitting: 69.5   Smokeless tobacco: Never  Vaping Use   Vaping Use: Never used  Substance and Sexual Activity   Alcohol use: No   Drug use: No   Sexual activity: Not on file  Other Topics Concern   Not on file  Social History Narrative   Not on file   Social Determinants of Health   Financial Resource Strain: Low Risk    Difficulty of Paying Living Expenses: Not hard at all  Food Insecurity: No Food Insecurity   Worried About Charity fundraiser in the Last Year: Never true   [place] in the Last Year: Never true  Transportation Needs: No Transportation Needs   Lack of Transportation (Medical): No   Lack of Transportation (Non-Medical): No  Physical Activity: Inactive   Days of Exercise per Week: 0 days   Minutes of Exercise per Session: 0 min  Stress: No Stress Concern Present   Feeling of Stress : Not at all  Social Connections: Socially Isolated   Frequency of Communication with Friends and Family: Once a week   Frequency of Social Gatherings with Friends and Family: Once a week   Attends Religious Services: Never   Marine scientist or Organizations: No   Attends Music therapist: Never   Marital Status: Married  Human resources officer Violence: Not At Risk   Fear of Current or Ex-Partner: No   Emotionally Abused: No   Physically Abused: No   Sexually Abused: No    Outpatient Medications Prior to Visit  Medication Sig Dispense Refill   amoxicillin (AMOXIL) 500 MG capsule Take 4 tablets(2000 mg) for dental work only 4 capsule 0   aspirin EC 81 MG tablet Take 81 mg by mouth daily.     [facility] SENNA PLUS 8.6-50 MG tablet TAKE 2 TABLETS BY MOUTH 2 (TWO) TIMES DAILY. OR AS DIRECTED BY HOSPICE NURSE 120 tablet 1   tamsulosin (FLOMAX) 0.4 MG CAPS capsule Take 1 capsule (0.4 mg total) by mouth at bedtime. 90 capsule 1   No facility-administered medications prior to  visit.    Allergies  Allergen Reactions   Lipitor [Atorvastatin Calcium] Other (See Comments)    MYALGIAS CRAMPS   Lisinopril Cough    ROS Review of Systems History unreliable from patient.  He has dementia.  History obtained from wife.   Objective:    Physical Exam Vitals reviewed.  Constitutional:      Appearance: Normal appearance.  Cardiovascular:     Rate and Rhythm: Normal rate and regular rhythm.  Pulmonary:     Effort: Pulmonary effort is normal.     Breath sounds: Normal breath sounds.  Abdominal:     Palpations: Abdomen is soft.     Tenderness: There is no abdominal tenderness.  Musculoskeletal:     Right lower leg: No edema.     Left lower leg: No edema.  Neurological:     Mental Status: He is alert.    BP 118/60 (BP Location: Left Arm, Patient Position: Sitting, Cuff Size: Normal)   Pulse 71   Temp 97.8 F (36.6 C) (Oral)   SpO2 97%  Wt Readings from Last 3 Encounters:  12/24/20 142 lb 14.4 oz (64.8 kg)  10/07/20 137 lb 12.8 oz (62.5 kg)  09/23/20 137 lb (62.1 kg)     Health Maintenance Due  Topic Date Due   COVID-19 Vaccine (1) Never done   Zoster Vaccines- Shingrix (1 of 2) Never done   PNA vac Low Risk Adult (2 of 2 - PPSV23) 04/28/2017    There are no preventive care reminders to display for this patient.  Lab Results  Component Value Date   TSH 2.63 09/23/2020   Lab Results  Component Value Date   WBC 7.2 09/23/2020   HGB 12.8 (L) 09/23/2020   HCT 38.7 (L) 09/23/2020   MCV 87.6 09/23/2020   PLT 219.0 09/23/2020   Lab Results  Component Value Date   NA 138 09/23/2020   K 4.8 09/23/2020   CO2 27 09/23/2020   GLUCOSE 96 09/23/2020   BUN 39 (H) 09/23/2020   CREATININE 1.27 09/23/2020   BILITOT 0.7 09/23/2020   ALKPHOS 91 09/23/2020   AST 19 09/23/2020   ALT 9 09/23/2020   PROT 7.3 09/23/2020   ALBUMIN 4.5 09/23/2020   CALCIUM 9.7 09/23/2020   ANIONGAP 7 12/10/2016   GFR 49.55 (L) 09/23/2020   Lab Results  Component  Value Date   CHOL 168 02/21/2019   Lab Results  Component Value Date   HDL 66 02/21/2019   Lab Results  Component Value Date   LDLCALC 89 02/21/2019   Lab Results  Component Value Date   TRIG 66 02/21/2019   Lab Results  Component Value Date   CHOLHDL 2.5 02/21/2019   Lab Results  Component Value Date   HGBA1C 5.8 (H) 12/02/2016      Assessment & Plan:   Intermittent constipation.  Symptoms likely exacerbated by immobility, and adequate fluids, possibly inadequate fiber  -Recommend goal of 25 to 30 g of fiber per day. -Consider Senokot-S as needed for constipation symptoms.  May also use MiraLAX as needed if not getting relief with the above  No orders of the defined types were placed in this encounter.   Follow-up: No follow-ups on file.    Carolann Littler, MD

## 2021-03-19 ENCOUNTER — Emergency Department (HOSPITAL_COMMUNITY): Payer: Medicare Other

## 2021-03-19 ENCOUNTER — Encounter (HOSPITAL_COMMUNITY): Payer: Self-pay | Admitting: Emergency Medicine

## 2021-03-19 ENCOUNTER — Other Ambulatory Visit: Payer: Self-pay

## 2021-03-19 ENCOUNTER — Inpatient Hospital Stay (HOSPITAL_COMMUNITY)
Admission: EM | Admit: 2021-03-19 | Discharge: 2021-03-26 | DRG: 871 | Disposition: A | Discharge status: Home / Self Care | Payer: Medicare Other | Attending: Internal Medicine | Admitting: Internal Medicine

## 2021-03-19 DIAGNOSIS — Z20822 Contact with and (suspected) exposure to covid-19: Secondary | ICD-10-CM | POA: Diagnosis not present

## 2021-03-19 DIAGNOSIS — G9341 Metabolic encephalopathy: Secondary | ICD-10-CM | POA: Diagnosis not present

## 2021-03-19 DIAGNOSIS — L89151 Pressure ulcer of sacral region, stage 1: Secondary | ICD-10-CM | POA: Diagnosis present

## 2021-03-19 DIAGNOSIS — K5790 Diverticulosis of intestine, part unspecified, without perforation or abscess without bleeding: Secondary | ICD-10-CM | POA: Diagnosis not present

## 2021-03-19 DIAGNOSIS — R6520 Severe sepsis without septic shock: Secondary | ICD-10-CM | POA: Diagnosis not present

## 2021-03-19 DIAGNOSIS — E7800 Pure hypercholesterolemia, unspecified: Secondary | ICD-10-CM | POA: Diagnosis present

## 2021-03-19 DIAGNOSIS — R5381 Other malaise: Secondary | ICD-10-CM | POA: Diagnosis not present

## 2021-03-19 DIAGNOSIS — R410 Disorientation, unspecified: Secondary | ICD-10-CM | POA: Diagnosis not present

## 2021-03-19 DIAGNOSIS — E43 Unspecified severe protein-calorie malnutrition: Secondary | ICD-10-CM | POA: Insufficient documentation

## 2021-03-19 DIAGNOSIS — R778 Other specified abnormalities of plasma proteins: Secondary | ICD-10-CM | POA: Diagnosis not present

## 2021-03-19 DIAGNOSIS — Y92009 Unspecified place in unspecified non-institutional (private) residence as the place of occurrence of the external cause: Secondary | ICD-10-CM

## 2021-03-19 DIAGNOSIS — Z681 Body mass index (BMI) 19 or less, adult: Secondary | ICD-10-CM

## 2021-03-19 DIAGNOSIS — F0390 Unspecified dementia without behavioral disturbance: Secondary | ICD-10-CM | POA: Diagnosis present

## 2021-03-19 DIAGNOSIS — I350 Nonrheumatic aortic (valve) stenosis: Secondary | ICD-10-CM | POA: Diagnosis not present

## 2021-03-19 DIAGNOSIS — Z923 Personal history of irradiation: Secondary | ICD-10-CM

## 2021-03-19 DIAGNOSIS — K6389 Other specified diseases of intestine: Secondary | ICD-10-CM | POA: Diagnosis not present

## 2021-03-19 DIAGNOSIS — C61 Malignant neoplasm of prostate: Secondary | ICD-10-CM | POA: Diagnosis not present

## 2021-03-19 DIAGNOSIS — W19XXXA Unspecified fall, initial encounter: Secondary | ICD-10-CM | POA: Diagnosis present

## 2021-03-19 DIAGNOSIS — I4891 Unspecified atrial fibrillation: Secondary | ICD-10-CM | POA: Diagnosis present

## 2021-03-19 DIAGNOSIS — Z8546 Personal history of malignant neoplasm of prostate: Secondary | ICD-10-CM | POA: Diagnosis not present

## 2021-03-19 DIAGNOSIS — Z7982 Long term (current) use of aspirin: Secondary | ICD-10-CM

## 2021-03-19 DIAGNOSIS — K5900 Constipation, unspecified: Secondary | ICD-10-CM | POA: Diagnosis not present

## 2021-03-19 DIAGNOSIS — A419 Sepsis, unspecified organism: Secondary | ICD-10-CM | POA: Diagnosis not present

## 2021-03-19 DIAGNOSIS — Z87891 Personal history of nicotine dependence: Secondary | ICD-10-CM

## 2021-03-19 DIAGNOSIS — Z820 Family history of epilepsy and other diseases of the nervous system: Secondary | ICD-10-CM

## 2021-03-19 DIAGNOSIS — Z888 Allergy status to other drugs, medicaments and biological substances status: Secondary | ICD-10-CM

## 2021-03-19 DIAGNOSIS — I6782 Cerebral ischemia: Secondary | ICD-10-CM | POA: Diagnosis not present

## 2021-03-19 DIAGNOSIS — I5022 Chronic systolic (congestive) heart failure: Secondary | ICD-10-CM | POA: Diagnosis not present

## 2021-03-19 DIAGNOSIS — Z823 Family history of stroke: Secondary | ICD-10-CM

## 2021-03-19 DIAGNOSIS — Z79899 Other long term (current) drug therapy: Secondary | ICD-10-CM

## 2021-03-19 DIAGNOSIS — I9589 Other hypotension: Secondary | ICD-10-CM | POA: Diagnosis present

## 2021-03-19 DIAGNOSIS — Z7189 Other specified counseling: Secondary | ICD-10-CM

## 2021-03-19 DIAGNOSIS — K6289 Other specified diseases of anus and rectum: Secondary | ICD-10-CM | POA: Diagnosis present

## 2021-03-19 DIAGNOSIS — Z66 Do not resuscitate: Secondary | ICD-10-CM

## 2021-03-19 DIAGNOSIS — I248 Other forms of acute ischemic heart disease: Secondary | ICD-10-CM | POA: Diagnosis not present

## 2021-03-19 DIAGNOSIS — Z515 Encounter for palliative care: Secondary | ICD-10-CM

## 2021-03-19 DIAGNOSIS — G319 Degenerative disease of nervous system, unspecified: Secondary | ICD-10-CM | POA: Diagnosis not present

## 2021-03-19 DIAGNOSIS — Z7401 Bed confinement status: Secondary | ICD-10-CM | POA: Diagnosis not present

## 2021-03-19 DIAGNOSIS — R7989 Other specified abnormal findings of blood chemistry: Secondary | ICD-10-CM | POA: Diagnosis present

## 2021-03-19 DIAGNOSIS — F05 Delirium due to known physiological condition: Secondary | ICD-10-CM | POA: Diagnosis present

## 2021-03-19 DIAGNOSIS — R509 Fever, unspecified: Secondary | ICD-10-CM | POA: Diagnosis not present

## 2021-03-19 DIAGNOSIS — Z8249 Family history of ischemic heart disease and other diseases of the circulatory system: Secondary | ICD-10-CM | POA: Diagnosis not present

## 2021-03-19 DIAGNOSIS — R531 Weakness: Secondary | ICD-10-CM | POA: Diagnosis not present

## 2021-03-19 DIAGNOSIS — I499 Cardiac arrhythmia, unspecified: Secondary | ICD-10-CM | POA: Diagnosis not present

## 2021-03-19 DIAGNOSIS — Z743 Need for continuous supervision: Secondary | ICD-10-CM | POA: Diagnosis not present

## 2021-03-19 DIAGNOSIS — Z952 Presence of prosthetic heart valve: Secondary | ICD-10-CM | POA: Diagnosis present

## 2021-03-19 DIAGNOSIS — R0902 Hypoxemia: Secondary | ICD-10-CM | POA: Diagnosis not present

## 2021-03-19 DIAGNOSIS — R4020 Unspecified coma: Secondary | ICD-10-CM | POA: Diagnosis not present

## 2021-03-19 DIAGNOSIS — F0280 Dementia in other diseases classified elsewhere without behavioral disturbance: Secondary | ICD-10-CM | POA: Diagnosis not present

## 2021-03-19 DIAGNOSIS — E46 Unspecified protein-calorie malnutrition: Secondary | ICD-10-CM | POA: Diagnosis present

## 2021-03-19 DIAGNOSIS — L8990 Pressure ulcer of unspecified site, unspecified stage: Secondary | ICD-10-CM | POA: Insufficient documentation

## 2021-03-19 DIAGNOSIS — G301 Alzheimer's disease with late onset: Secondary | ICD-10-CM | POA: Diagnosis not present

## 2021-03-19 DIAGNOSIS — Z801 Family history of malignant neoplasm of trachea, bronchus and lung: Secondary | ICD-10-CM

## 2021-03-19 HISTORY — DX: Unspecified dementia, unspecified severity, without behavioral disturbance, psychotic disturbance, mood disturbance, and anxiety: F03.90

## 2021-03-19 HISTORY — DX: Do not resuscitate: Z66

## 2021-03-19 LAB — URINALYSIS, ROUTINE W REFLEX MICROSCOPIC
Bilirubin Urine: NEGATIVE
Glucose, UA: NEGATIVE mg/dL
Ketones, ur: NEGATIVE mg/dL
Leukocytes,Ua: NEGATIVE
Nitrite: NEGATIVE
Protein, ur: NEGATIVE mg/dL
Specific Gravity, Urine: 1.015 (ref 1.005–1.030)
pH: 5 (ref 5.0–8.0)

## 2021-03-19 LAB — CBC WITH DIFFERENTIAL/PLATELET
Abs Immature Granulocytes: 0.04 10*3/uL (ref 0.00–0.07)
Basophils Absolute: 0.1 10*3/uL (ref 0.0–0.1)
Basophils Relative: 1 %
Eosinophils Absolute: 0.1 10*3/uL (ref 0.0–0.5)
Eosinophils Relative: 1 %
HCT: 35.3 % — ABNORMAL LOW (ref 39.0–52.0)
Hemoglobin: 11.1 g/dL — ABNORMAL LOW (ref 13.0–17.0)
Immature Granulocytes: 0 %
Lymphocytes Relative: 2 %
Lymphs Abs: 0.3 10*3/uL — ABNORMAL LOW (ref 0.7–4.0)
MCH: 29.1 pg (ref 26.0–34.0)
MCHC: 31.4 g/dL (ref 30.0–36.0)
MCV: 92.7 fL (ref 80.0–100.0)
Monocytes Absolute: 0.2 10*3/uL (ref 0.1–1.0)
Monocytes Relative: 2 %
Neutro Abs: 10.3 10*3/uL — ABNORMAL HIGH (ref 1.7–7.7)
Neutrophils Relative %: 94 %
Platelets: 154 10*3/uL (ref 150–400)
RBC: 3.81 MIL/uL — ABNORMAL LOW (ref 4.22–5.81)
RDW: 15.3 % (ref 11.5–15.5)
WBC: 11 10*3/uL — ABNORMAL HIGH (ref 4.0–10.5)
nRBC: 0 % (ref 0.0–0.2)

## 2021-03-19 LAB — TROPONIN I (HIGH SENSITIVITY)
Troponin I (High Sensitivity): 92 ng/L — ABNORMAL HIGH (ref <18)
Troponin I (High Sensitivity): 416 ng/L (ref <18)
Troponin I (High Sensitivity): 586 ng/L (ref <18)
Troponin I (High Sensitivity): 556 ng/L (ref <18)

## 2021-03-19 LAB — COMPREHENSIVE METABOLIC PANEL
ALT: 16 U/L (ref 0–44)
AST: 28 U/L (ref 15–41)
Albumin: 3.7 g/dL (ref 3.5–5.0)
Alkaline Phosphatase: 92 U/L (ref 38–126)
Anion gap: 8 (ref 5–15)
BUN: 27 mg/dL — ABNORMAL HIGH (ref 8–23)
CO2: 21 mmol/L — ABNORMAL LOW (ref 22–32)
Calcium: 8.8 mg/dL — ABNORMAL LOW (ref 8.9–10.3)
Chloride: 109 mmol/L (ref 98–111)
Creatinine, Ser: 1.14 mg/dL (ref 0.61–1.24)
GFR, Estimated: >60 mL/min (ref >60)
Glucose, Bld: 91 mg/dL (ref 70–99)
Potassium: 3.8 mmol/L (ref 3.5–5.1)
Sodium: 138 mmol/L (ref 135–145)
Total Bilirubin: 1.3 mg/dL — ABNORMAL HIGH (ref 0.3–1.2)
Total Protein: 5.7 g/dL — ABNORMAL LOW (ref 6.5–8.1)

## 2021-03-19 LAB — LACTIC ACID, PLASMA
Lactic Acid, Venous: 2 mmol/L (ref 0.5–1.9)
Lactic Acid, Venous: 1.2 mmol/L (ref 0.5–1.9)

## 2021-03-19 LAB — RESP PANEL BY RT-PCR (FLU A&B, COVID) ARPGX2
Influenza A by PCR: NEGATIVE
Influenza B by PCR: NEGATIVE
SARS Coronavirus 2 by RT PCR: NEGATIVE

## 2021-03-19 LAB — PROCALCITONIN: Procalcitonin: 3.25 ng/mL

## 2021-03-19 LAB — APTT: aPTT: 32 seconds (ref 24–36)

## 2021-03-19 LAB — PROTIME-INR
INR: 1.3 — ABNORMAL HIGH (ref 0.8–1.2)
Prothrombin Time: 16.1 seconds — ABNORMAL HIGH (ref 11.4–15.2)

## 2021-03-19 MED ORDER — DOCUSATE SODIUM 100 MG PO CAPS
100.0000 mg | ORAL_CAPSULE | Freq: Two times a day (BID) | ORAL | Status: DC
  Administered 2021-03-19 – 2021-03-26 (×15): 100 mg via ORAL
  Filled 2021-03-19 (×15): qty 1

## 2021-03-19 MED ORDER — TAMSULOSIN HCL 0.4 MG PO CAPS
0.4000 mg | ORAL_CAPSULE | Freq: Every day | ORAL | Status: DC
  Administered 2021-03-19 – 2021-03-26 (×8): 0.4 mg via ORAL
  Filled 2021-03-19 (×8): qty 1

## 2021-03-19 MED ORDER — ONDANSETRON HCL 4 MG/2ML IJ SOLN: 4.0000 mg | Freq: Four times a day (QID) | INTRAMUSCULAR | Status: DC | PRN

## 2021-03-19 MED ORDER — ACETAMINOPHEN 325 MG PO TABS
650.0000 mg | ORAL_TABLET | Freq: Four times a day (QID) | ORAL | Status: DC | PRN
  Administered 2021-03-19 – 2021-03-23 (×2): 650 mg via ORAL
  Filled 2021-03-19 (×2): qty 2

## 2021-03-19 MED ORDER — SODIUM CHLORIDE 0.9% FLUSH
3.0000 mL | Freq: Two times a day (BID) | INTRAVENOUS | Status: DC
  Administered 2021-03-19 – 2021-03-25 (×9): 3 mL via INTRAVENOUS

## 2021-03-19 MED ORDER — BISACODYL 5 MG PO TBEC: 5.0000 mg | DELAYED_RELEASE_TABLET | Freq: Every day | ORAL | Status: DC | PRN

## 2021-03-19 MED ORDER — SODIUM CHLORIDE 0.9 % IV SOLN
2.0000 g | Freq: Once | INTRAVENOUS | Status: AC
  Administered 2021-03-19: 2 g via INTRAVENOUS
  Filled 2021-03-19: qty 20

## 2021-03-19 MED ORDER — SODIUM CHLORIDE 0.9 % IV BOLUS
1000.0000 mL | Freq: Once | INTRAVENOUS | Status: AC
  Administered 2021-03-19: 1000 mL via INTRAVENOUS

## 2021-03-19 MED ORDER — IOHEXOL 300 MG/ML  SOLN
100.0000 mL | Freq: Once | INTRAMUSCULAR | Status: AC | PRN
  Administered 2021-03-19: 100 mL via INTRAVENOUS

## 2021-03-19 MED ORDER — SODIUM CHLORIDE 0.9 % IV BOLUS
500.0000 mL | Freq: Once | INTRAVENOUS | Status: AC
  Administered 2021-03-19: 500 mL via INTRAVENOUS

## 2021-03-19 MED ORDER — METRONIDAZOLE 500 MG/100ML IV SOLN
500.0000 mg | Freq: Once | INTRAVENOUS | Status: AC
  Administered 2021-03-19: 500 mg via INTRAVENOUS
  Filled 2021-03-19: qty 100

## 2021-03-19 MED ORDER — ACETAMINOPHEN 650 MG RE SUPP: 650.0000 mg | Freq: Four times a day (QID) | RECTAL | Status: DC | PRN

## 2021-03-19 MED ORDER — ONDANSETRON HCL 4 MG PO TABS: 4.0000 mg | ORAL_TABLET | Freq: Four times a day (QID) | ORAL | Status: DC | PRN

## 2021-03-19 MED ORDER — SODIUM CHLORIDE 0.9 % IV SOLN
2.0000 g | Freq: Once | INTRAVENOUS | Status: AC
  Administered 2021-03-19: 2 g via INTRAVENOUS
  Filled 2021-03-19: qty 2

## 2021-03-19 MED ORDER — ASPIRIN EC 81 MG PO TBEC
81.0000 mg | DELAYED_RELEASE_TABLET | Freq: Every day | ORAL | Status: DC
  Administered 2021-03-19 – 2021-03-26 (×8): 81 mg via ORAL
  Filled 2021-03-19 (×8): qty 1

## 2021-03-19 MED ORDER — OXYCODONE HCL 5 MG PO TABS
5.0000 mg | ORAL_TABLET | ORAL | Status: DC | PRN
  Filled 2021-03-19: qty 1

## 2021-03-19 MED ORDER — POLYETHYLENE GLYCOL 3350 17 G PO PACK: 17.0000 g | PACK | Freq: Every day | ORAL | Status: DC | PRN

## 2021-03-19 MED ORDER — LACTATED RINGERS IV SOLN: INTRAVENOUS | Status: DC

## 2021-03-19 MED ORDER — METRONIDAZOLE 500 MG/100ML IV SOLN
500.0000 mg | Freq: Three times a day (TID) | INTRAVENOUS | Status: DC
  Administered 2021-03-19 – 2021-03-22 (×9): 500 mg via INTRAVENOUS
  Filled 2021-03-19 (×8): qty 100

## 2021-03-19 MED ORDER — LORAZEPAM 2 MG/ML IJ SOLN
0.5000 mg | Freq: Once | INTRAMUSCULAR | Status: AC | PRN
  Administered 2021-03-20: 0.5 mg via INTRAVENOUS
  Filled 2021-03-19: qty 1

## 2021-03-19 MED ORDER — SODIUM CHLORIDE 0.9 % IV SOLN
2.0000 g | Freq: Two times a day (BID) | INTRAVENOUS | Status: DC
  Administered 2021-03-20 – 2021-03-22 (×5): 2 g via INTRAVENOUS
  Filled 2021-03-19 (×5): qty 2

## 2021-03-19 MED ORDER — ACETAMINOPHEN 160 MG/5ML PO SOLN
650.0000 mg | Freq: Once | ORAL | Status: AC
  Administered 2021-03-19: 650 mg via ORAL
  Filled 2021-03-19: qty 20.3

## 2021-03-19 MED ORDER — ENOXAPARIN SODIUM 40 MG/0.4ML IJ SOSY
40.0000 mg | PREFILLED_SYRINGE | INTRAMUSCULAR | Status: DC
  Administered 2021-03-20 – 2021-03-25 (×7): 40 mg via SUBCUTANEOUS
  Filled 2021-03-19 (×7): qty 0.4

## 2021-03-19 NOTE — ED Provider Notes (Signed)
Patient initially seen by Dr. Sedonia Small.  Please see his note.  Plan was to admit for possible sepsis.  Urinalysis does not suggest UTI.  Chest x-ray does not show pneumonia.  COVID and flu are negative.  Patient CT scan does show the possibility of proctitis.  Patient has been having some GI symptoms recently.  Patient was initially given a dose of Rocephin.  I will add on Flagyl.  I will consult the medical service for admission.  Patient's blood pressure has improved.  Is 110/82.  He does remain mildly tachycardic but is awake and alert.  Extremities appear well-perfused.  Does not appear to be in any distress at this time   Jonathan Rank, MD 03/19/21 [phone]

## 2021-03-19 NOTE — Progress Notes (Addendum)
Pharmacy Antibiotic Note  Jonathan Graham is a 87 y.o. male admitted on 03/19/2021 with sepsis.  Pharmacy has been consulted for cefepime dosing.  Plan: Cefepime 2g IV q12h -Monitor renal function, clinical status, and antibiotic plan  Height: 6\' 1"  (185.4 cm) Weight: 70 kg (154 lb 5.2 oz) IBW/kg (Calculated) : 79.9  Temp (24hrs), Avg:99.5 F (37.5 C), Min:97.8 F (36.6 C), Max:102.2 F (39 C)  Recent Labs  Lab 03/19/21 0516 03/19/21 0815  WBC 11.0*  --   CREATININE 1.14  --   LATICACIDVEN 2.0* 1.2    Estimated Creatinine Clearance: 41.8 mL/min (by C-G formula based on SCr of 1.14 mg/dL).    Allergies  Allergen Reactions   Lipitor [Atorvastatin Calcium] Other (See Comments)    MYALGIAS CRAMPS   Lisinopril Cough    Antimicrobials this admission: Cefepime 7/20 >>  Flagyl 7/20 >> CTX x 1 dose  Microbiology results: 7/20 BCx:  7/20 UCx:   Thank you for allowing pharmacy to be a part of this patient's care.  Joetta Manners, PharmD, [facility] Emergency Medicine Clinical Pharmacist ED RPh Phone: [place]: [phone]

## 2021-03-19 NOTE — Progress Notes (Signed)
Asked to evaluate for elevated troponin. Patient admitted with sepsis. He is 87 years old and frail. He has no obstructive CAD by prior cath and is s/p TAVR. Troponin levels 586>>416. I think this is c/w demand ischemia in the setting of sepsis and would focus treatment on this. He is not a candidate for further ischemic work up.  Batina Dougan [name] MD, [facility]

## 2021-03-19 NOTE — ED Provider Notes (Signed)
[facility] Emergency Department Provider Note MRN:  [id number]  Arrival date & time: 03/19/21     Chief Complaint   Fall (Sepsis)   History of Present Illness   Jonathan Graham is a 87 y.o. year-old male with a history of dementia presenting to the ED with chief complaint of fall.  Fall at home, also with fever.  Patient has dementia and unable to give further history.  I was unable to obtain an accurate HPI, PMH, or ROS due to the patient's dementia.  Level 5 caveat.  Review of Systems  Positive for fall, fever.  Patient's Health History    Past Medical History:  Diagnosis Date   Arthritis    Atypical chest pain    history of    Back pain    Cancer ([place]) 2014   prostate   Cardiovascular stress test abnormal 04/25/2004   Abnormal adenosine Cardiolite study / evidence of inferoseptal ischemia &  an EF of 48% ""recommended that he undergo cardiac catheterization""   Cervical spine disease    Dementia (HCC)    Dizziness    occasionally apon bending over   Fatigue    history of    Heart murmur    History of hiatal hernia    History of mononucleosis    Hypercholesterolemia    Left bundle branch block    Pleurisy 07/2005   S/P radiation therapy  12/05/2012 through 01/30/2013                                                         Prostate 7800 cGy 40 sessions, seminal vesicles 5600 cGy 40 sessions                        Shortness of breath    Spondylosis, cervical    at C3-C4   Varicose veins    Weakness    history of     Past Surgical History:  Procedure Laterality Date   BACK SURGERY  1995    2 ruptured discs in lower back / by Dr. Louanne Skye   CARDIAC CATHETERIZATION  2005   EF estimated at 50% /  PROCEDURE:  Left heart catheterization, coronary and left ventricular angiography /  RAO view demonstrates mild LV enlargement  / mild hypokinesia anteroseptal with  overall low normal LV systolic function / Normal coronary anatomy   HEMORRHOID  SURGERY     PROSTATE BIOPSY  08/25/2012   Gleason 3+3=6 PSA 7.80   RIGHT/LEFT HEART CATH AND CORONARY ANGIOGRAPHY N/A 11/13/2016   Procedure: Right/Left Heart Cath and Coronary Angiography;  Surgeon: [name] [name], MD;  Location: [facility];  Service: Cardiovascular;  Laterality: N/A;   TEE WITHOUT CARDIOVERSION N/A 12/08/2016   Procedure: TRANSESOPHAGEAL ECHOCARDIOGRAM (TEE);  Surgeon: Burnell Blanks, MD;  Location: [facility];  Service: Open Heart Surgery;  Laterality: N/A;   TONSILLECTOMY AND ADENOIDECTOMY     TRANSCATHETER AORTIC VALVE REPLACEMENT, TRANSFEMORAL N/A 12/08/2016   Procedure: TRANSCATHETER AORTIC VALVE REPLACEMENT, TRANSFEMORAL;  Surgeon: Burnell Blanks, MD;  Location: [facility];  Service: Open Heart Surgery;  Laterality: N/A;    Family History  Problem Relation Age of Onset   Stroke Father    Cancer Father    Heart failure Mother  Heart disease Mother    Multiple sclerosis Sister    Lung cancer Brother     Social History   Socioeconomic History   Marital status: Married    Spouse name: Not on file   Number of children: 2   Years of education: Not on file   Highest education level: Not on file  Occupational History   Occupation: Insurance    Comment: Financial planner  Tobacco Use   Smoking status: Former    Packs/day: 1.00    Years: 2.00    Pack years: 2.00    Types: Cigarettes    Start date: 08/31/1949    Quit date: 09/01/1951    Years since quitting: 69.5   Smokeless tobacco: Never  Vaping Use   Vaping Use: Never used  Substance and Sexual Activity   Alcohol use: No   Drug use: No   Sexual activity: Not on file  Other Topics Concern   Not on file  Social History Narrative   Not on file   Social Determinants of Health   Financial Resource Strain: Low Risk    Difficulty of Paying Living Expenses: Not hard at all  Food Insecurity: No Food Insecurity   Worried About Charity fundraiser in the Last Year: Never true   [place]  in the Last Year: Never true  Transportation Needs: No Transportation Needs   Lack of Transportation (Medical): No   Lack of Transportation (Non-Medical): No  Physical Activity: Inactive   Days of Exercise per Week: 0 days   Minutes of Exercise per Session: 0 min  Stress: No Stress Concern Present   Feeling of Stress : Not at all  Social Connections: Socially Isolated   Frequency of Communication with Friends and Family: Once a week   Frequency of Social Gatherings with Friends and Family: Once a week   Attends Religious Services: Never   Marine scientist or Organizations: No   Attends Music therapist: Never   Marital Status: Married  Human resources officer Violence: Not At Risk   Fear of Current or Ex-Partner: No   Emotionally Abused: No   Physically Abused: No   Sexually Abused: No     Physical Exam   Vitals:   03/19/21 0615 03/19/21 0630  BP: 115/66 101/62  Pulse: 88 92  Resp: 20 16  Temp:    SpO2: 95% 94%    CONSTITUTIONAL: Chronically ill-appearing, NAD NEURO: Awake, alert, oriented to name, moves all extremities EYES:  eyes equal and reactive ENT/NECK:  no LAD, no JVD CARDIO: Tachycardic rate, well-perfused, normal S1 and S2 PULM:  CTAB no wheezing or rhonchi GI/GU:  normal bowel sounds, non-distended, non-tender MSK/SPINE:  No gross deformities, no edema SKIN:  no rash, atraumatic PSYCH:  Appropriate speech and behavior  *Additional and/or pertinent findings included in MDM below  Diagnostic and Interventional Summary    EKG Interpretation  Date/Time:  Wednesday March 19 2021 05:17:07 EDT Ventricular Rate:  93 PR Interval:    QRS Duration: 138 QT Interval:  400 QTC Calculation: 498 R Axis:   95 Text Interpretation: Atrial fibrillation Consider left ventricular hypertrophy Probable lateral infarct, age indeterminate Anterior Q waves, possibly due to LVH Nonspecific T abnormalities, lateral leads Artifact in lead(s) II III aVR aVL aVF V1 V2  V6 Confirmed by Gerlene Fee [phone]) on 03/19/2021 5:19:03 AM       Labs Reviewed  COMPREHENSIVE METABOLIC PANEL - Abnormal; Notable for the following components:  Result Value   CO2 21 (*)    BUN 27 (*)    Calcium 8.8 (*)    Total Protein 5.7 (*)    Total Bilirubin 1.3 (*)    All other components within normal limits  CBC WITH DIFFERENTIAL/PLATELET - Abnormal; Notable for the following components:   WBC 11.0 (*)    RBC 3.81 (*)    Hemoglobin 11.1 (*)    HCT 35.3 (*)    Neutro Abs 10.3 (*)    Lymphs Abs 0.3 (*)    All other components within normal limits  PROTIME-INR - Abnormal; Notable for the following components:   Prothrombin Time 16.1 (*)    INR 1.3 (*)    All other components within normal limits  URINALYSIS, ROUTINE W REFLEX MICROSCOPIC - Abnormal; Notable for the following components:   Hgb urine dipstick SMALL (*)    Bacteria, UA RARE (*)    All other components within normal limits  TROPONIN I (HIGH SENSITIVITY) - Abnormal; Notable for the following components:   Troponin I (High Sensitivity) 92 (*)    All other components within normal limits  RESP PANEL BY RT-PCR (FLU A&B, COVID) ARPGX2  CULTURE, BLOOD (ROUTINE X 2)  CULTURE, BLOOD (ROUTINE X 2)  URINE CULTURE  APTT  LACTIC ACID, PLASMA  LACTIC ACID, PLASMA  TROPONIN I (HIGH SENSITIVITY)    DG Chest Port 1 View  Final Result    CT ABDOMEN PELVIS W CONTRAST    (Results Pending)  CT HEAD WO CONTRAST    (Results Pending)    Medications  sodium chloride 0.9 % bolus 1,000 mL (0 mLs Intravenous Stopped 03/19/21 0616)  cefTRIAXone (ROCEPHIN) 2 g in sodium chloride 0.9 % 100 mL IVPB (0 g Intravenous Stopped 03/19/21 0600)  acetaminophen (TYLENOL) 160 MG/5ML solution 650 mg (650 mg Oral Given 03/19/21 0551)     Procedures  /  Critical Care .Critical Care  Date/Time: 03/19/2021 5:19 AM Performed by: Maudie Flakes, MD Authorized by: Maudie Flakes, MD   Critical care provider statement:    Critical  care time (minutes):  35   Critical care was necessary to treat or prevent imminent or life-threatening deterioration of the following conditions: Concern for sepsis.   Critical care was time spent personally by me on the following activities:  Discussions with consultants, evaluation of patient's response to treatment, examination of patient, ordering and performing treatments and interventions, ordering and review of laboratory studies, ordering and review of radiographic studies, pulse oximetry, re-evaluation of patient's condition, obtaining history from patient or surrogate and review of old charts  ED Course and Medical Decision Making  I have reviewed the triage vital signs, the nursing notes, and pertinent available records from the EMR.  Listed above are laboratory and imaging tests that I personally ordered, reviewed, and interpreted and then considered in my medical decision making (see below for details).  Patient arriving after a fall with fever and tachycardia, code sepsis initiated, considering pulmonary or urinary source.  Nontraumatic on exam.  EKG with some nonspecific changes.  Awaiting labs, imaging.     Unclear source of fever.  Urinalysis without signs of infection, chest x-ray normal, COVID swab is negative.  Will pursue CT imaging of the abdomen.  Patient's troponin is elevated and so will need admission.  Signed out to oncoming provider at shift change.  Barth Kirks. Sedonia Small, MD [place] [email or web address]  Final Clinical Impressions(s) / ED Diagnoses  ICD-10-CM   1. Sepsis, due to unspecified organism, unspecified whether acute organ dysfunction present [facility])  A41.9       ED Discharge Orders     None        Discharge Instructions Discussed with and Provided to Patient:   Discharge Instructions   None       Maudie Flakes, MD 03/19/21 [phone]

## 2021-03-19 NOTE — H&P (Signed)
History and Physical    Jonathan Graham FBP:[id number] DOB: October 25, 1927 DOA: 03/19/2021  PCP: [name], MD Consultants:  [name] - cardiology; palliative care Patient coming from:  Home - lives with wife; NOK: Wife, [phone]; [phone]  Chief Complaint: Fall  HPI: Jonathan Graham is a 87 y.o. male with medical history significant of prostate CA treated with radiation therapy; dementia; s/p TAVR; and HLD presenting with a fall.  His wife reports that he started overnight with severe shaking. He felt like he needed to get up and walk.  It was too hard to get him up - too weak, didn't have enough energy.  His wife called the fire department to try to get him in bed.  He was febrile and they called EMS and recommended admission.  He was at the doctor yesterday regarding constipation; he did have BMs yesterday.  He has had similar episodes several times in the past, usually related to low BP (but not very low this time).  He feels pretty well now, shaking has resolved.    ED Course: h/o proctitis.  Presented with fall, found to have fever to 102.  Has had some constipation, seem recently for this.  Soft BP.  CT with ?proctitis, maybe this is the source.  Given Rocephin, Flagyl, fluids.  Review of Systems: As per HPI; otherwise review of systems reviewed and negative.   Ambulatory Status:  Ambulates with a cane or walker  COVID Vaccine Status:  Complete, no booster  Past Medical History:  Diagnosis Date   Arthritis    Atypical chest pain    history of    Back pain    Cancer ([place]) 2014   prostate   Cardiovascular stress test abnormal 04/25/2004   Abnormal adenosine Cardiolite study / evidence of inferoseptal ischemia &  an EF of 48% ""recommended that he undergo cardiac catheterization""   Cervical spine disease    Dementia (HCC)    Dizziness    occasionally apon bending over   DNR (do not resuscitate) 03/19/2021   Fatigue    history of    Heart murmur    History  of hiatal hernia    History of mononucleosis    Hypercholesterolemia    Left bundle branch block    Pleurisy 07/2005   S/P radiation therapy  12/05/2012 through 01/30/2013                                                         Prostate 7800 cGy 40 sessions, seminal vesicles 5600 cGy 40 sessions                        Shortness of breath    Spondylosis, cervical    at C3-C4   Varicose veins    Weakness    history of     Past Surgical History:  Procedure Laterality Date   BACK SURGERY  1995    2 ruptured discs in lower back / by Dr. Louanne Skye   CARDIAC CATHETERIZATION  2005   EF estimated at 50% /  PROCEDURE:  Left heart catheterization, coronary and left ventricular angiography /  RAO view demonstrates mild LV enlargement  / mild hypokinesia anteroseptal with  overall low normal LV systolic function / Normal coronary anatomy   HEMORRHOID  SURGERY     PROSTATE BIOPSY  08/25/2012   Gleason 3+3=6 PSA 7.80   RIGHT/LEFT HEART CATH AND CORONARY ANGIOGRAPHY N/A 11/13/2016   Procedure: Right/Left Heart Cath and Coronary Angiography;  Surgeon: [name] [name], MD;  Location: [facility];  Service: Cardiovascular;  Laterality: N/A;   TEE WITHOUT CARDIOVERSION N/A 12/08/2016   Procedure: TRANSESOPHAGEAL ECHOCARDIOGRAM (TEE);  Surgeon: Burnell Blanks, MD;  Location: [facility];  Service: Open Heart Surgery;  Laterality: N/A;   TONSILLECTOMY AND ADENOIDECTOMY     TRANSCATHETER AORTIC VALVE REPLACEMENT, TRANSFEMORAL N/A 12/08/2016   Procedure: TRANSCATHETER AORTIC VALVE REPLACEMENT, TRANSFEMORAL;  Surgeon: Burnell Blanks, MD;  Location: [facility];  Service: Open Heart Surgery;  Laterality: N/A;    Social History   Socioeconomic History   Marital status: Married    Spouse name: Not on file   Number of children: 2   Years of education: Not on file   Highest education level: Not on file  Occupational History   Occupation: Insurance    Comment: Financial planner  Tobacco Use    Smoking status: Former    Packs/day: 1.00    Years: 2.00    Pack years: 2.00    Types: Cigarettes    Start date: 08/31/1949    Quit date: 09/01/1951    Years since quitting: 69.5   Smokeless tobacco: Never  Vaping Use   Vaping Use: Never used  Substance and Sexual Activity   Alcohol use: No   Drug use: No   Sexual activity: Not on file  Other Topics Concern   Not on file  Social History Narrative   Not on file   Social Determinants of Health   Financial Resource Strain: Low Risk    Difficulty of Paying Living Expenses: Not hard at all  Food Insecurity: No Food Insecurity   Worried About Charity fundraiser in the Last Year: Never true   [place] in the Last Year: Never true  Transportation Needs: No Transportation Needs   Lack of Transportation (Medical): No   Lack of Transportation (Non-Medical): No  Physical Activity: Inactive   Days of Exercise per Week: 0 days   Minutes of Exercise per Session: 0 min  Stress: No Stress Concern Present   Feeling of Stress : Not at all  Social Connections: Socially Isolated   Frequency of Communication with Friends and Family: Once a week   Frequency of Social Gatherings with Friends and Family: Once a week   Attends Religious Services: Never   Marine scientist or Organizations: No   Attends Music therapist: Never   Marital Status: Married  Human resources officer Violence: Not At Risk   Fear of Current or Ex-Partner: No   Emotionally Abused: No   Physically Abused: No   Sexually Abused: No    Allergies  Allergen Reactions   Lipitor [Atorvastatin Calcium] Other (See Comments)    MYALGIAS CRAMPS   Lisinopril Cough    Family History  Problem Relation Age of Onset   Stroke Father    Cancer Father    Heart failure Mother    Heart disease Mother    Multiple sclerosis Sister    Lung cancer Brother     Prior to Admission medications   Medication Sig Start Date End Date Taking? Authorizing Provider   amoxicillin (AMOXIL) 500 MG capsule Take 4 tablets(2000 mg) for dental work only 12/16/20   [name], [name], MD  aspirin EC 81  MG tablet Take 81 mg by mouth daily.    [provider]  [facility] SENNA PLUS 8.6-50 MG tablet TAKE 2 TABLETS BY MOUTH 2 (TWO) TIMES DAILY. OR AS DIRECTED BY HOSPICE NURSE 03/22/20   Burchette, Alinda Sierras, MD  tamsulosin (FLOMAX) 0.4 MG CAPS capsule Take 1 capsule (0.4 mg total) by mouth at bedtime. 08/31/19   Erlene Quan, PA-C    Physical Exam: Vitals:   03/19/21 0900 03/19/21 1000 03/19/21 1100 03/19/21 1200  BP: 105/76 (!) 93/58 102/69 99/70  Pulse: (!) 115 71 76 69  Resp: 17 12 18 13   Temp:      TempSrc:      SpO2: 95% 96% 98% 96%  Weight:      Height:         General:  Appears calm and comfortable and is in NAD; frail Eyes:  PERRL, EOMI, normal lids, iris ENT:   hard of hearing, grossly normal lips & tongue, mmm Neck:  no LAD, masses or thyromegaly Cardiovascular:  RRR, no m/r/g. No LE edema.  Respiratory:   CTA bilaterally with no wheezes/rales/rhonchi.  Normal respiratory effort. Abdomen:  soft, mildly diffusely TTP, ND Skin:  no rash or induration seen on limited exam Musculoskeletal:  grossly normal tone BUE/BLE, good ROM, no bony abnormality Psychiatric:  pleasant but detached mood and affect, speech fluent and appropriate, AOx3 Neurologic:  CN 2-12 grossly intact, moves all extremities in coordinated fashion    Radiological Exams on Admission: Independently reviewed - see discussion in A/P where applicable  CT HEAD WO CONTRAST  Result Date: 03/19/2021 CLINICAL DATA:  Lower extremity weakness EXAM: CT HEAD WITHOUT CONTRAST TECHNIQUE: Contiguous axial images were obtained from the base of the skull through the vertex without intravenous contrast. COMPARISON:  10/12/2007 FINDINGS: Brain: No evidence of acute infarction, hemorrhage, hydrocephalus, extra-axial collection or mass lesion/mass effect. Mild atrophic and chronic white matter  ischemic changes are noted. Vascular: No hyperdense vessel or unexpected calcification. Skull: Normal. Negative for fracture or focal lesion. Sinuses/Orbits: Paranasal sinuses are within normal limits. Minimal fluid is noted within the left mastoid air cells posteriorly. Other: None. IMPRESSION: Chronic atrophic and ischemic changes without acute intracranial abnormality. Mild left mastoid air cell effusion. Electronically Signed   By: Inez Catalina M.D.   On: 03/19/2021 08:15   CT ABDOMEN PELVIS W CONTRAST  Result Date: 03/19/2021 CLINICAL DATA:  Constipation EXAM: CT ABDOMEN AND PELVIS WITH CONTRAST TECHNIQUE: Multidetector CT imaging of the abdomen and pelvis was performed using the standard protocol following bolus administration of intravenous contrast. CONTRAST:  146mL OMNIPAQUE IOHEXOL 300 MG/ML  SOLN COMPARISON:  11/30/2016 FINDINGS: Lower chest: Bibasilar atelectatic changes are noted. Findings of prior TAVR are seen. Hepatobiliary: Stable cysts are again identified within the right lobe of the liver. The gallbladder is well distended without significant wall thickening or evidence of cholelithiasis. Pancreas: Pancreas is well visualized. Area of decreased attenuation is again identified but less prominent than that seen on the prior exam and appears to represent a an area of focal fatty infiltration. Spleen: Normal in size without focal abnormality. Adrenals/Urinary Tract: Adrenal glands are within normal limits. Kidneys demonstrate a normal enhancement pattern bilaterally. No renal calculi or obstructive changes are seen. Normal excretion is noted on delayed images. A small less than 1 cm cystic lesion is noted within the right kidney. No obstructive changes are seen. The bladder is well distended. Stomach/Bowel: Mild rectal wall thickening is noted which may represent and focal degree of proctitis.  Diverticular change of the colon is again identified without evidence of diverticulitis. No obstructive  or inflammatory changes of the colon are seen. The appendix is within normal limits. Small bowel is unremarkable. Stomach is decompressed. Vascular/Lymphatic: Aortic atherosclerosis. No enlarged abdominal or pelvic lymph nodes. Reproductive: Prostate is unremarkable. Fiducial markers are noted within the prostate. Other: No abdominal wall hernia or abnormality. No abdominopelvic ascites. Musculoskeletal: Chronic appearing T12 compression deformity is noted. This is new however from the prior exam of 2018. Degenerative changes of lumbar spine are noted. IMPRESSION: Rectal wall thickening consistent with a degree of proctitis. No constipation is identified. Hypodensity is again noted in the midportion the pancreas although less prominent than that seen on the prior exam and has the appearance of an area of focal fatty infiltration. Given its long-term stability and regression as well as the patient's age, it is felt to be not clinically significant. Chronic appearing T12 compression deformity. No other focal abnormality is noted. Electronically Signed   By: Inez Catalina M.D.   On: 03/19/2021 08:21   DG Chest Port 1 View  Result Date: 03/19/2021 CLINICAL DATA:  Questionable sepsis EXAM: PORTABLE CHEST 1 VIEW COMPARISON:  12/09/2016 FINDINGS: Normal heart size and stable mediastinal contours. Transcatheter aortic valve replacement. No convincing pneumonia. No edema, effusion, or pneumothorax. IMPRESSION: No focal pneumonia. Electronically Signed   By: Monte Fantasia M.D.   On: 03/19/2021 05:43    EKG: Independently reviewed.  Afib with rate 93; LVH with no evidence of acute ischemia   Labs on Admission: I have personally reviewed the available labs and imaging studies at the time of the admission.  Pertinent labs:   BUN 27/Creatinine 1.14/GFR >60 HS troponin 92, 416 Lactate 2.0, 1.2 WBC 11.0 Hgb 11.1 INE 1.3 COVID/flu negative UA: Hgb small   Assessment/Plan Principal Problem:   Sepsis due to  undetermined organism [facility]) Active Problems:   Hypercholesterolemia   Prostate cancer (HCC)   Chronic systolic CHF (congestive heart failure) (HCC)   History of transcatheter aortic valve replacement (TAVR)   Elevated troponin   Dementia without behavioral disturbance ([place])   Malnutrition (HCC)   DNR (do not resuscitate)   Sepsis, presumed due to proctitis -Sepsis indicates life-threatening organ dysfunction with mortality >10%, caused by dysregulation to host response.   -SIRS criteria in this patient includes: Leukocytosis, fever, tachycardia, tachypnea  -Patient has evidence of acute organ failure with elevated lactate >2 that is not easily explained by another condition. -While awaiting blood cultures, this appears to be a preseptic condition. -Sepsis protocol initiated -Suspected source is proctitis given patient's recent complaints of constipation and RLQ/suprapubic abdominal pain in conjunction with CT findings -Blood and urine cultures pending -Will place in observation status with telemetry and continue to monitor -Treat with IV Cefepime/Flagyl for complicated intraabdominal infection (as per order set) -Will order procalcitonin level.  Antibiotics would not be indicated for PCT <0.1 and probably should not be used for < 0.25.  >0.5 indicates infection and >>0.5 indicates more serious disease.  As the procalcitonin level normalizes, it will be reasonable to consider de-escalation of antibiotic coverage. -Continue bowel regimen, avoid narcotics when possible  Elevated troponin -Patient with initially elevated troponin and significantly positive delta -Denies CP -This is thought to be related to demand ischemia in the setting of sepsis at an advanced age -Will monitor on telemetry -Hold cardiology consult for now  Dementia -He does not appear to be taking medications for this issue at this time  -Family  counseled about how patients with dementia often do better behaviorally  while hospitalized if family members are present overnight; order placed requesting nursing to allow wife to stay with patient overnight -Delirium precautions ordered   Prostate CA -Remote, treated with radiation therapy -Continue Flomax  S/p TAVR -2018 -Needs to continue ASA indefinitely  Chronic systolic CHF -No meds due to hypotension, as per cardiology -Followed by Dr. [name]  HLD -He does not appear to be taking medications for this issue at this time  Malnutrition -Body mass index is 20.36 kg/m..  -The patient has at least 2 indicators for malnutrition (insufficient energy intake, weight loss, loss of muscle mass, loss of subcutaneous fat  -This is likely due to chronic disease -Will obtain a nutrition consult for further recommendations.   DNR -I have discussed code status with the patient's wife and the patient would not desire resuscitation and would prefer to die a natural death should that situation arise. -She does wish to continue to treat the treatable. -He will need a gold out of facility DNR form at the time of discharge      Note: This patient has been tested and is negative for the novel coronavirus COVID-19. He has been fully vaccinated against COVID-19.   Level of care: Telemetry Medical  DVT prophylaxis:  Lovenox Code Status:  DNR - confirmed with patient/family Family Communication: Wife present throughout evaluation  Disposition Plan:  The patient is from: home  Anticipated d/c is to: home without [facility] services  Anticipated d/c date will depend on clinical response to treatment, but possibly as early as tomorrow if he has excellent response to treatment  Patient is currently: acutely ill Consults called: Nutrition Admission status: It is my clinical opinion that referral for OBSERVATION is reasonable and necessary in this patient based on the above information provided. The aforementioned taken together are felt to place the patient at high risk for  further clinical deterioration. However it is anticipated that the patient may be medically stable for discharge from the hospital within 24 to 48 hours.        How to contact the [facility] Attending or Consulting provider [place] or covering provider during after hours [place], for this patient?  Check the care team in [facility] and look for a) attending/consulting TR[facility] provider listed and b) the [facility] team listed Log into [email or web address] and use [place]'s universal password to access. If you do not have the password, please contact the hospital operator. Locate the [facility] provider you are looking for under [facility] and page to a number that you can be directly reached. If you still have difficulty reaching the provider, please page the [facility] (Director on Call) for the Hospitalists listed on amion for assistance.   03/19/2021, 12:48 PM

## 2021-03-19 NOTE — Sepsis Progress Note (Signed)
Monitoring for code sepsis protocol. 

## 2021-03-19 NOTE — ED Triage Notes (Signed)
Patient arrived with EMS from home lost his balance and fell this evening , no LOC , denies pain , respirations unlabored , history of dementia [place] , febrile at arrival .

## 2021-03-19 NOTE — ED Notes (Signed)
Patient transported to CT 

## 2021-03-20 DIAGNOSIS — F0390 Unspecified dementia without behavioral disturbance: Secondary | ICD-10-CM | POA: Diagnosis present

## 2021-03-20 DIAGNOSIS — I9589 Other hypotension: Secondary | ICD-10-CM | POA: Diagnosis present

## 2021-03-20 DIAGNOSIS — K5900 Constipation, unspecified: Secondary | ICD-10-CM | POA: Diagnosis present

## 2021-03-20 DIAGNOSIS — I350 Nonrheumatic aortic (valve) stenosis: Secondary | ICD-10-CM | POA: Diagnosis present

## 2021-03-20 DIAGNOSIS — A419 Sepsis, unspecified organism: Secondary | ICD-10-CM | POA: Diagnosis present

## 2021-03-20 DIAGNOSIS — Z823 Family history of stroke: Secondary | ICD-10-CM | POA: Diagnosis not present

## 2021-03-20 DIAGNOSIS — I248 Other forms of acute ischemic heart disease: Secondary | ICD-10-CM | POA: Diagnosis present

## 2021-03-20 DIAGNOSIS — R6520 Severe sepsis without septic shock: Secondary | ICD-10-CM | POA: Diagnosis present

## 2021-03-20 DIAGNOSIS — G301 Alzheimer's disease with late onset: Secondary | ICD-10-CM | POA: Diagnosis not present

## 2021-03-20 DIAGNOSIS — E43 Unspecified severe protein-calorie malnutrition: Secondary | ICD-10-CM | POA: Diagnosis present

## 2021-03-20 DIAGNOSIS — L89151 Pressure ulcer of sacral region, stage 1: Secondary | ICD-10-CM | POA: Diagnosis present

## 2021-03-20 DIAGNOSIS — Z66 Do not resuscitate: Secondary | ICD-10-CM | POA: Diagnosis present

## 2021-03-20 DIAGNOSIS — R410 Disorientation, unspecified: Secondary | ICD-10-CM | POA: Diagnosis not present

## 2021-03-20 DIAGNOSIS — R778 Other specified abnormalities of plasma proteins: Secondary | ICD-10-CM | POA: Diagnosis not present

## 2021-03-20 DIAGNOSIS — Z681 Body mass index (BMI) 19 or less, adult: Secondary | ICD-10-CM | POA: Diagnosis not present

## 2021-03-20 DIAGNOSIS — Z20822 Contact with and (suspected) exposure to covid-19: Secondary | ICD-10-CM | POA: Diagnosis present

## 2021-03-20 DIAGNOSIS — Z923 Personal history of irradiation: Secondary | ICD-10-CM | POA: Diagnosis not present

## 2021-03-20 DIAGNOSIS — Z8546 Personal history of malignant neoplasm of prostate: Secondary | ICD-10-CM | POA: Diagnosis not present

## 2021-03-20 DIAGNOSIS — I5022 Chronic systolic (congestive) heart failure: Secondary | ICD-10-CM | POA: Diagnosis present

## 2021-03-20 DIAGNOSIS — Z515 Encounter for palliative care: Secondary | ICD-10-CM | POA: Diagnosis not present

## 2021-03-20 DIAGNOSIS — C61 Malignant neoplasm of prostate: Secondary | ICD-10-CM | POA: Diagnosis not present

## 2021-03-20 DIAGNOSIS — Y92009 Unspecified place in unspecified non-institutional (private) residence as the place of occurrence of the external cause: Secondary | ICD-10-CM | POA: Diagnosis not present

## 2021-03-20 DIAGNOSIS — E7800 Pure hypercholesterolemia, unspecified: Secondary | ICD-10-CM | POA: Diagnosis present

## 2021-03-20 DIAGNOSIS — Z7189 Other specified counseling: Secondary | ICD-10-CM | POA: Diagnosis not present

## 2021-03-20 DIAGNOSIS — Z952 Presence of prosthetic heart valve: Secondary | ICD-10-CM | POA: Diagnosis not present

## 2021-03-20 DIAGNOSIS — I4891 Unspecified atrial fibrillation: Secondary | ICD-10-CM | POA: Diagnosis present

## 2021-03-20 DIAGNOSIS — G9341 Metabolic encephalopathy: Secondary | ICD-10-CM | POA: Diagnosis present

## 2021-03-20 DIAGNOSIS — Z8249 Family history of ischemic heart disease and other diseases of the circulatory system: Secondary | ICD-10-CM | POA: Diagnosis not present

## 2021-03-20 DIAGNOSIS — K6289 Other specified diseases of anus and rectum: Secondary | ICD-10-CM | POA: Diagnosis present

## 2021-03-20 DIAGNOSIS — Z87891 Personal history of nicotine dependence: Secondary | ICD-10-CM | POA: Diagnosis not present

## 2021-03-20 DIAGNOSIS — W19XXXA Unspecified fall, initial encounter: Secondary | ICD-10-CM | POA: Diagnosis present

## 2021-03-20 DIAGNOSIS — F05 Delirium due to known physiological condition: Secondary | ICD-10-CM | POA: Diagnosis present

## 2021-03-20 LAB — URINE CULTURE: Culture: 10000 — AB

## 2021-03-20 LAB — CBC
HCT: 31.8 % — ABNORMAL LOW (ref 39.0–52.0)
Hemoglobin: 10.4 g/dL — ABNORMAL LOW (ref 13.0–17.0)
MCH: 29.7 pg (ref 26.0–34.0)
MCHC: 32.7 g/dL (ref 30.0–36.0)
MCV: 90.9 fL (ref 80.0–100.0)
Platelets: 127 10*3/uL — ABNORMAL LOW (ref 150–400)
RBC: 3.5 MIL/uL — ABNORMAL LOW (ref 4.22–5.81)
RDW: 15.4 % (ref 11.5–15.5)
WBC: 11.4 10*3/uL — ABNORMAL HIGH (ref 4.0–10.5)
nRBC: 0 % (ref 0.0–0.2)

## 2021-03-20 LAB — BASIC METABOLIC PANEL
Anion gap: 7 (ref 5–15)
BUN: 21 mg/dL (ref 8–23)
CO2: 18 mmol/L — ABNORMAL LOW (ref 22–32)
Calcium: 8.1 mg/dL — ABNORMAL LOW (ref 8.9–10.3)
Chloride: 113 mmol/L — ABNORMAL HIGH (ref 98–111)
Creatinine, Ser: 0.97 mg/dL (ref 0.61–1.24)
GFR, Estimated: >60 mL/min (ref >60)
Glucose, Bld: 83 mg/dL (ref 70–99)
Potassium: 3.7 mmol/L (ref 3.5–5.1)
Sodium: 138 mmol/L (ref 135–145)

## 2021-03-20 MED ORDER — METOPROLOL TARTRATE 12.5 MG HALF TABLET
12.5000 mg | ORAL_TABLET | Freq: Two times a day (BID) | ORAL | Status: DC
  Administered 2021-03-20 (×2): 12.5 mg via ORAL
  Filled 2021-03-20 (×2): qty 1

## 2021-03-20 MED ORDER — SODIUM CHLORIDE 0.9 % IV BOLUS
250.0000 mL | Freq: Once | INTRAVENOUS | Status: AC
  Administered 2021-03-20: 250 mL via INTRAVENOUS

## 2021-03-20 MED ORDER — QUETIAPINE FUMARATE 25 MG PO TABS
12.5000 mg | ORAL_TABLET | Freq: Two times a day (BID) | ORAL | Status: DC
  Administered 2021-03-20: 12.5 mg via ORAL
  Filled 2021-03-20 (×2): qty 1

## 2021-03-20 MED ORDER — QUETIAPINE FUMARATE 25 MG PO TABS
25.0000 mg | ORAL_TABLET | Freq: Two times a day (BID) | ORAL | Status: DC
  Administered 2021-03-20 – 2021-03-21 (×2): 25 mg via ORAL
  Filled 2021-03-20 (×2): qty 1

## 2021-03-20 MED ORDER — HALOPERIDOL LACTATE 5 MG/ML IJ SOLN
1.0000 mg | Freq: Once | INTRAMUSCULAR | Status: AC | PRN
  Administered 2021-03-20: 1 mg via INTRAVENOUS
  Filled 2021-03-20: qty 1

## 2021-03-20 NOTE — ED Notes (Signed)
Notified security to transport wife upstairs.

## 2021-03-20 NOTE — ED Notes (Signed)
Tele  Breakfast Ordered 

## 2021-03-20 NOTE — Progress Notes (Signed)
OT Cancellation Note  Patient Details Name: Jonathan Graham MRN: [id number] DOB: 09-28-1927   Cancelled Treatment:    Reason Eval/Treat Not Completed: Medical issues which prohibited therapy (Elevated HR and restless. Will return as schedule allows.)  [place], OTR/L Acute Rehab Pager: [phone] Office: [phone] 03/20/2021, 10:39 AM

## 2021-03-20 NOTE — ED Notes (Signed)
Pt family stated they were extremely tired. This RN advise them to leave and I will watch over the patient. Pt is restless and trying to get out of bed.Pt is pulling off cords . Pt IV wrapped.Pt lining changed. Pt is only attached to bp cuff due to patient refusing to keep heart monitor and sp02 monitoring on.

## 2021-03-20 NOTE — Progress Notes (Signed)
PROGRESS NOTE  MAIJOR HORNIG EHU:[id number] DOB: 01-09-1928 DOA: 03/19/2021 PCP: [name], MD   LOS: 0 days   Brief Narrative / Interim history: 87 year old male with history of prostate cancer status post XRT, dementia, history of TAVR, hyperlipidemia comes in with a fall.  Wife reports that he has been weaker, started to have severe shaking, felt like he needed to get up and walk but had no energy to do so.  Family called EMS, he was febrile and was brought to the hospital.  He was febrile in the ED to 102, has been having intermittent constipation, and a CT scan of the abdomen pelvis showed proctitis as a potential source.  He was placed on antibiotics and admitted to the hospital.  Subjective / 24h Interval events: Quite confused this morning, delirious, picking things up in the air.  Assessment & Plan: Principal Problem Sepsis, presumed due to proctitis -patient has been placed on antibiotics, continue.  He is afebrile and normotensive this morning, however he is increasingly tachycardic -Continue to closely monitor  Active Problems In hospital delirium, acute metabolic encephalopathy with underlying dementia-patient much more delirious this morning, far from his baseline.  This is likely due to sepsis/hospitalization.  Unfortunately he is still in the ER and room has no windows and relatively dark -Had a paradoxical reaction overnight to Haldol as well as Ativan, will try Seroquel this morning  A. fib with RVR-patient tachycardic at times in the 130s, in the setting of agitation as well as probable sepsis -Placed on metoprolol.  Not a candidate for anticoagulation given advanced dementia and falls at home  Chronic systolic CHF -seen by cardiology as an outpatient, most recently echo showed EF 30-35%, unchanged.  He is on no meds due to chronic hypotension.  Tolerated fluid bolus in the ED well, hold additional fluids given normotensive  Aortic valve stenosis-status post  TAVR  Elevated troponin-demand ischemia  Malnutrition, goals of care-discussed with the daughter at bedside that he has been declining significantly over the last year.  He has lost a lot of weight, is less mobile and he is not eating as well.  Discussed at length that this may be due to the trajectory of his dementia and at this point it can be considered a terminal illness.  If he recovers following this hospital stay may be very appropriate for home with hospice to which daughter is agreeable  Scheduled Meds:  aspirin EC  81 mg Oral Daily   docusate sodium  100 mg Oral BID   enoxaparin (LOVENOX) injection  40 mg Subcutaneous Q24H   metoprolol tartrate  12.5 mg Oral BID   QUEtiapine  12.5 mg Oral BID   sodium chloride flush  3 mL Intravenous Q12H   tamsulosin  0.4 mg Oral Daily   Continuous Infusions:  ceFEPime (MAXIPIME) IV Stopped (03/20/21 0234)   lactated ringers Stopped (03/20/21 1008)   metronidazole Stopped (03/20/21 1008)   PRN Meds:.acetaminophen **OR** acetaminophen, bisacodyl, ondansetron **OR** ondansetron (ZOFRAN) IV, oxyCODONE, polyethylene glycol  Diet Orders (From admission, onward)     Start     Ordered   03/19/21 1221  Diet full liquid Room service appropriate? Yes; Fluid consistency: Thin  Diet effective now       Comments: Advance as tolerated to regular diet  Question Answer Comment  Room service appropriate? Yes   Fluid consistency: Thin      03/19/21 1222            DVT prophylaxis:  enoxaparin (LOVENOX) injection 40 mg Start: 03/19/21 1800     Code Status: DNR  Family Communication: daughter at bedside   Status is: Observation  The patient will require care spanning > 2 midnights and should be moved to inpatient because: Altered mental status, IV treatments appropriate due to intensity of illness or inability to take PO, and Inpatient level of care appropriate due to severity of illness  Dispo: The patient is from: Home               Anticipated d/c is to: Home              Patient currently is not medically stable to d/c.   Difficult to place patient No  Level of care: Telemetry Medical  Consultants:  None   Procedures:  none  Microbiology  Blood cultures-pending Urine cultures-10,000 multiple species  Antimicrobials: -Cefepime, Metronidazole   Objective: Vitals:   03/20/21 0600 03/20/21 0821 03/20/21 0830 03/20/21 1005  BP: (!) 123/91 140/89 131/87 (!) 127/95  Pulse: 70 (!) 132 (!) 130 (!) 133  Resp: 18 19 (!) 24 (!) 22  Temp:      TempSrc:      SpO2: 97% 100% 100% 98%  Weight:      Height:        Intake/Output Summary (Last 24 hours) at 03/20/2021 1049 Last data filed at 03/20/2021 0234 Gross per 24 hour  Intake 296.91 ml  Output --  Net 296.91 ml   Filed Weights   03/19/21 0509  Weight: 70 kg    Examination:  Constitutional: No distress, agitated Eyes: no scleral icterus ENMT: Mucous membranes are moist.  Neck: normal, supple Respiratory: clear to auscultation bilaterally, no wheezing, no crackles. Normal respiratory effort. No accessory muscle use.  Cardiovascular: Irregular, tachycardic, no peripheral edema Abdomen: non distended, no tenderness. Bowel sounds positive.  Musculoskeletal: no clubbing / cyanosis.  Skin: no rashes Neurologic: Does not follow commands but flails all 4 independently and strength appears equal   Data Reviewed: I have independently reviewed following labs and imaging studies   CBC: Recent Labs  Lab 03/19/21 0516 03/20/21 0500  WBC 11.0* 11.4*  NEUTROABS 10.3*  --   HGB 11.1* 10.4*  HCT 35.3* 31.8*  MCV 92.7 90.9  PLT 154 732*   Basic Metabolic Panel: Recent Labs  Lab 03/19/21 0516 03/20/21 0500  NA 138 138  K 3.8 3.7  CL 109 113*  CO2 21* 18*  GLUCOSE 91 83  BUN 27* 21  CREATININE 1.14 0.97  CALCIUM 8.8* 8.1*   Liver Function Tests: Recent Labs  Lab 03/19/21 0516  AST 28  ALT 16  ALKPHOS 92  BILITOT 1.3*  PROT 5.7*   ALBUMIN 3.7   Coagulation Profile: Recent Labs  Lab 03/19/21 0516  INR 1.3*   HbA1C: No results for input(s): HGBA1C in the last 72 hours. CBG: No results for input(s): GLUCAP in the last 168 hours.  Recent Results (from the past 240 hour(s))  Resp Panel by RT-PCR (Flu A&B, Covid) Nasopharyngeal Swab     Status: None   Collection Time: 03/19/21  5:16 AM   Specimen: Nasopharyngeal Swab; Nasopharyngeal(NP) swabs in vial transport medium  Result Value Ref Range Status   SARS Coronavirus 2 by RT PCR NEGATIVE NEGATIVE Final    Comment: (NOTE) SARS-CoV-2 target nucleic acids are NOT DETECTED.  The SARS-CoV-2 RNA is generally detectable in upper respiratory specimens during the acute phase of infection. The lowest concentration of SARS-CoV-2 viral copies  this assay can detect is 138 copies/mL. A negative result does not preclude SARS-Cov-2 infection and should not be used as the sole basis for treatment or other patient management decisions. A negative result may occur with  improper specimen collection/handling, submission of specimen other than nasopharyngeal swab, presence of viral mutation(s) within the areas targeted by this assay, and inadequate number of viral copies(<138 copies/mL). A negative result must be combined with clinical observations, patient history, and epidemiological information. The expected result is Negative.  Fact Sheet for Patients:  EntrepreneurPulse.com.au  Fact Sheet for Healthcare Providers:  IncredibleEmployment.be  This test is no t yet approved or cleared by the [place] FDA and  has been authorized for detection and/or diagnosis of SARS-CoV-2 by FDA under an Emergency Use Authorization (EUA). This EUA will remain  in effect (meaning this test can be used) for the duration of the COVID-19 declaration under Section 564(b)(1) of the Act, 21 U.S.C.section 360bbb-3(b)(1), unless the authorization is  terminated  or revoked sooner.       Influenza A by PCR NEGATIVE NEGATIVE Final   Influenza B by PCR NEGATIVE NEGATIVE Final    Comment: (NOTE) The Xpert Xpress SARS-CoV-2/FLU/RSV plus assay is intended as an aid in the diagnosis of influenza from Nasopharyngeal swab specimens and should not be used as a sole basis for treatment. Nasal washings and aspirates are unacceptable for Xpert Xpress SARS-CoV-2/FLU/RSV testing.  Fact Sheet for Patients: EntrepreneurPulse.com.au  Fact Sheet for Healthcare Providers: IncredibleEmployment.be  This test is not yet approved or cleared by the [place] FDA and has been authorized for detection and/or diagnosis of SARS-CoV-2 by FDA under an Emergency Use Authorization (EUA). This EUA will remain in effect (meaning this test can be used) for the duration of the COVID-19 declaration under Section 564(b)(1) of the Act, 21 U.S.C. section 360bbb-3(b)(1), unless the authorization is terminated or revoked.  Performed at [facility], [place] [address] [place], [place] [address]   Urine Culture     Status: Abnormal   Collection Time: 03/19/21  5:20 AM   Specimen: In/Out Cath Urine  Result Value Ref Range Status   Specimen Description IN/OUT CATH URINE  Final   Special Requests   Final    NONE Performed at [facility], [place] [address] [place], [place] [address]    Culture (A)  Final    10,000 COLONIES/mL MULTIPLE SPECIES PRESENT, SUGGEST RECOLLECTION   Report Status 03/20/2021 FINAL  Final     Radiology Studies: No results found.   Marzetta Board, MD, PhD [facility]  Between 7 am - 7 pm I am available, please contact me via Amion (for emergencies) or Securechat (non urgent messages)  Between 7 pm - 7 am I am not available, please contact night coverage MD/APP via Amion

## 2021-03-20 NOTE — Progress Notes (Signed)
PT Cancellation Note  Patient Details Name: Jonathan Graham MRN: [id number] DOB: Dec 31, 1927   Cancelled Treatment:    Reason Eval/Treat Not Completed: Medical issues which prohibited therapy Per RN, pt very restless and with elevated HR. Not appropriate for PT at this time. Will follow up as schedule allows.   Lou Miner, DPT  Acute Rehabilitation Services  Pager: [phone] Office: [phone]    Rudean Hitt 03/20/2021, 9:18 AM

## 2021-03-20 NOTE — ED Notes (Signed)
Patient continuously attempting to swing legs over side of stretcher rails and get out of bed. Patient refusing to stay on telemetry leads at this time. Safety mittens in place. Sitter order placed. IV infusion stopped by previous RN due to patient pulling at IV tubing. Per previous RN, provider aware that maintenance fluids are not being administered at this time due to patient behaviors.

## 2021-03-20 NOTE — ED Notes (Signed)
Informed nurse of pt HR

## 2021-03-20 NOTE — ED Notes (Signed)
Pt is having worsening agitation after Ativan administration - family at bedside helping with comfort measures but pt continuously attempting to get up from bed and pulling at condom cath/monitor cords/etc. Admitting notified.

## 2021-03-20 NOTE — Progress Notes (Signed)
   03/20/21 1737  Assess: MEWS Score  Temp 98.8 F (37.1 C)  BP 125/89  Pulse Rate (!) 159  Resp 20  SpO2 94 %  O2 Device Room Air  Assess: MEWS Score  MEWS Temp 0  MEWS Systolic 0  MEWS Pulse 3  MEWS RR 0  MEWS LOC 0  MEWS Score 3  MEWS Score Color Yellow  Assess: if the MEWS score is Yellow or Red  Were vital signs taken at a resting state? Yes  Focused Assessment No change from prior assessment  Early Detection of Sepsis Score *See Row Information* Medium  MEWS guidelines implemented *See Row Information* Yes  Treat  Pain Scale 0-10  Pain Score 2  Pain Type Chronic pain  Pain Location Knee  Pain Orientation Right  Notify: Charge Nurse/RN  Name of Charge Nurse/RN Notified Carla RN  Date Charge Nurse/RN Notified 03/20/21  Time Charge Nurse/RN Notified 1743  Notify: Provider  Provider Name/Title Dr Patterson Hammersmith  Date Provider Notified 03/20/21  Time Provider Notified [phone]  Notification Type Face-to-face  Notification Reason Other (Comment) (yellow MEWS)  Provider response At bedside  Date of Provider Response 03/20/21  Time of Provider Response 1741  Document  Patient Outcome Other (Comment) (continue to monitor)  Progress note created (see row info) Yes    Per Provider just monitor  adjusting medication.

## 2021-03-20 NOTE — ED Notes (Signed)
Tele Alphonzo [place] daughter [phone] would like an update

## 2021-03-20 NOTE — ED Notes (Signed)
Patient's daughter at bedside.

## 2021-03-21 DIAGNOSIS — A419 Sepsis, unspecified organism: Secondary | ICD-10-CM | POA: Diagnosis not present

## 2021-03-21 DIAGNOSIS — I5022 Chronic systolic (congestive) heart failure: Secondary | ICD-10-CM | POA: Diagnosis not present

## 2021-03-21 DIAGNOSIS — L8990 Pressure ulcer of unspecified site, unspecified stage: Secondary | ICD-10-CM | POA: Insufficient documentation

## 2021-03-21 DIAGNOSIS — E43 Unspecified severe protein-calorie malnutrition: Secondary | ICD-10-CM | POA: Insufficient documentation

## 2021-03-21 LAB — BASIC METABOLIC PANEL
Anion gap: 9 (ref 5–15)
BUN: 22 mg/dL (ref 8–23)
CO2: 17 mmol/L — ABNORMAL LOW (ref 22–32)
Calcium: 8.5 mg/dL — ABNORMAL LOW (ref 8.9–10.3)
Chloride: 110 mmol/L (ref 98–111)
Creatinine, Ser: 1.2 mg/dL (ref 0.61–1.24)
GFR, Estimated: 57 mL/min — ABNORMAL LOW (ref >60)
Glucose, Bld: 96 mg/dL (ref 70–99)
Potassium: 3.8 mmol/L (ref 3.5–5.1)
Sodium: 136 mmol/L (ref 135–145)

## 2021-03-21 LAB — CBC
HCT: 34.3 % — ABNORMAL LOW (ref 39.0–52.0)
Hemoglobin: 11.1 g/dL — ABNORMAL LOW (ref 13.0–17.0)
MCH: 28.6 pg (ref 26.0–34.0)
MCHC: 32.4 g/dL (ref 30.0–36.0)
MCV: 88.4 fL (ref 80.0–100.0)
Platelets: 153 10*3/uL (ref 150–400)
RBC: 3.88 MIL/uL — ABNORMAL LOW (ref 4.22–5.81)
RDW: 15.2 % (ref 11.5–15.5)
WBC: 9.1 10*3/uL (ref 4.0–10.5)
nRBC: 0 % (ref 0.0–0.2)

## 2021-03-21 MED ORDER — METOPROLOL TARTRATE 25 MG PO TABS
25.0000 mg | ORAL_TABLET | Freq: Two times a day (BID) | ORAL | Status: DC
  Administered 2021-03-21 – 2021-03-26 (×11): 25 mg via ORAL
  Filled 2021-03-21 (×11): qty 1

## 2021-03-21 MED ORDER — ENSURE ENLIVE PO LIQD
237.0000 mL | Freq: Three times a day (TID) | ORAL | Status: DC
  Administered 2021-03-21 – 2021-03-26 (×15): 237 mL via ORAL

## 2021-03-21 MED ORDER — QUETIAPINE FUMARATE 25 MG PO TABS
25.0000 mg | ORAL_TABLET | Freq: Every day | ORAL | Status: DC
  Administered 2021-03-22 – 2021-03-23 (×2): 25 mg via ORAL
  Filled 2021-03-21 (×2): qty 1

## 2021-03-21 MED ORDER — ADULT MULTIVITAMIN W/MINERALS CH
1.0000 | ORAL_TABLET | Freq: Every day | ORAL | Status: DC
  Administered 2021-03-21 – 2021-03-26 (×6): 1 via ORAL
  Filled 2021-03-21 (×6): qty 1

## 2021-03-21 NOTE — Progress Notes (Signed)
Initial Nutrition Assessment  DOCUMENTATION CODES:  Severe malnutrition in context of chronic illness  INTERVENTION:  Add Ensure Enlive po TID, each supplement provides 350 kcal and 20 grams of protein.  Add Magic cup TID with meals, each supplement provides 290 kcal and 9 grams of protein.  Add MVI with minerals daily.   Obtain updated weight.  NUTRITION DIAGNOSIS:  Severe Malnutrition related to chronic illness (dementia) as evidenced by severe fat depletion, severe muscle depletion.  GOAL:  Patient will meet greater than or equal to 90% of their needs  MONITOR:  Diet advancement, PO intake, Supplement acceptance, Labs, Weight trends, Skin, I & O's  REASON FOR ASSESSMENT:  Consult Assessment of nutrition requirement/status  ASSESSMENT:  87 yo male with a PMH of prostate CA s/p XRT, dementia, A. fib with RVR, chronic systolic CHF, aortic valve stenosis, TAVR, and HLD who presents with sepsis d/t unknown organism.  Per Epic, pt ate 25% of breakfast this morning. Attempted to speak with pt at bedside. Pt did not awaken to touch or voice. Unable to get any history from pt.  Per MD note, pt losing weight, is less mobile and does not eat as well. This is a significant decline over the past year. MD consulted palliative to discuss next steps - may go hospice or comfort care.  Per Epic, pt's weight has increased since previous admission. Pt does not look to be 70 kg. Pt will need to be reweighed to determine true weight. Will base weight off of 12/24/20 weight of 64.8 kg.  Pt with mostly severe depletions on exam.  Given above information, pt is severely malnourished in the chronic setting.  Recommend advancing diet as tolerated, adding Ensure Enlive TID, and Magic Cup TID, as well as MVI with minerals daily.  Medications: reviewed; colace BID, cefepime BID via IV, Flagyl TID via IV  Labs: reviewed  NUTRITION - FOCUSED PHYSICAL EXAM: Flowsheet Row Most Recent Value  Orbital  Region Severe depletion  Upper Arm Region Severe depletion  Thoracic and Lumbar Region Moderate depletion  Buccal Region Severe depletion  Temple Region Severe depletion  Clavicle Bone Region Severe depletion  Clavicle and Acromion Bone Region Severe depletion  Scapular Bone Region Severe depletion  Dorsal Hand Unable to assess  [[place]]  Patellar Region Moderate depletion  Anterior Thigh Region Moderate depletion  Posterior Calf Region Moderate depletion  Edema (RD Assessment) None  Hair Reviewed  Eyes Unable to assess  Mouth Reviewed  Skin Reviewed  Nails Unable to assess  [Mitts]   Diet Order:   Diet Order             Diet full liquid Room service appropriate? Yes; Fluid consistency: Thin  Diet effective now                  EDUCATION NEEDS:  Not appropriate for education at this time  Skin:  Skin Assessment: Skin Integrity Issues: Skin Integrity Issues:: Stage I Stage I: Pressure Injury - mid sacrum  Last BM:  03/18/21  Height:  Ht Readings from Last 1 Encounters:  03/19/21 '6\' 1"'$  (1.854 m)   Weight:  Wt Readings from Last 1 Encounters:  03/19/21 70 kg   BMI:  Body mass index is 20.36 kg/m.  Estimated Nutritional Needs:  Kcal:  1850-2050 Protein:  85-100 grams Fluid:  >1.85 L  Derrel Nip, RD, LDN (she/her/hers) Registered Dietitian I After-Hours/Weekend Pager # in [place]

## 2021-03-21 NOTE — Progress Notes (Addendum)
PROGRESS NOTE  Jonathan Graham [id number] DOB: Jan 06, 1928 DOA: 03/19/2021 PCP: [name], MD   LOS: 1 day   Brief Narrative / Interim history: 87 year old male with history of prostate cancer status post XRT, dementia, history of TAVR, hyperlipidemia comes in with a fall.  Wife reports that he has been weaker, started to have severe shaking, felt like he needed to get up and walk but had no energy to do so.  Family called EMS, he was febrile and was brought to the hospital.  He was febrile in the ED to 102, has been having intermittent constipation, and a CT scan of the abdomen pelvis showed proctitis as a potential source.  He was placed on antibiotics and admitted to the hospital.  Subjective / 24h Interval events: Smiling, confused, picking things up in the air  Assessment & Plan: Principal Problem Sepsis, presumed due to proctitis -patient has been placed on antibiotics, continue.  Sepsis physiology improving, cultures are negative -Afebrile and normotensive but still tachycardic -Continue to closely monitor, anticipate narrowing antibiotics by tomorrow if he remains afebrile  Active Problems In hospital delirium, acute metabolic encephalopathy with underlying dementia-patient quite delirious, 5 from baseline -This is likely due to sepsis/hospitalization -Had a paradoxical reaction overnight to Haldol as well as Ativan, will try Seroquel this morning  A. fib with RVR-started on metoprolol yesterday, heart rate overall better, increase the dose today, he is asymptomatic and normotensive. -Not a candidate for anticoagulation given advanced dementia and falls at home  Chronic systolic CHF -seen by cardiology as an outpatient, most recently echo showed EF 30-35%, unchanged.  He is on no meds due to chronic hypotension but does not seem to have this issue here.  Tolerated fluid bolus in the ED well, hold additional fluids given that he is normotensive  Aortic valve  stenosis-status post TAVR  Elevated troponin-demand ischemia  Malnutrition, goals of care-discussed with the daughter at bedside that he has been declining significantly over the last year.  He has lost a lot of weight, is less mobile and he is not eating as well.  Discussed at length that this may be due to the trajectory of his dementia and at this point it can be considered a terminal illness.  If he recovers following this hospital stay may be very appropriate for home with hospice to which daughter is agreeable, however today she tells me that he is too weak for home now and she would like to pursue SNF. TOC consulted  Scheduled Meds:  aspirin EC  81 mg Oral Daily   docusate sodium  100 mg Oral BID   enoxaparin (LOVENOX) injection  40 mg Subcutaneous Q24H   metoprolol tartrate  25 mg Oral BID   QUEtiapine  25 mg Oral BID   sodium chloride flush  3 mL Intravenous Q12H   tamsulosin  0.4 mg Oral Daily   Continuous Infusions:  ceFEPime (MAXIPIME) IV 2 g (03/21/21 0018)   lactated ringers Stopped (03/20/21 1008)   metronidazole 500 mg (03/21/21 0825)   PRN Meds:.acetaminophen **OR** acetaminophen, bisacodyl, ondansetron **OR** ondansetron (ZOFRAN) IV, oxyCODONE, polyethylene glycol  Diet Orders (From admission, onward)     Start     Ordered   03/19/21 1221  Diet full liquid Room service appropriate? Yes; Fluid consistency: Thin  Diet effective now       Comments: Advance as tolerated to regular diet  Question Answer Comment  Room service appropriate? Yes   Fluid consistency: Thin  03/19/21 1222            DVT prophylaxis: enoxaparin (LOVENOX) injection 40 mg Start: 03/19/21 1800     Code Status: DNR  Family Communication: daughter at bedside   Status is: Observation  The patient will require care spanning > 2 midnights and should be moved to inpatient because: Altered mental status, IV treatments appropriate due to intensity of illness or inability to take PO, and  Inpatient level of care appropriate due to severity of illness  Dispo: The patient is from: Home              Anticipated d/c is to: Home              Patient currently is not medically stable to d/c.   Difficult to place patient No  Level of care: Telemetry Medical  Consultants:  None   Procedures:  none  Microbiology  Blood cultures-pending Urine cultures-10,000 multiple species  Antimicrobials: -Cefepime, Metronidazole   Objective: Vitals:   03/20/21 1942 03/20/21 2219 03/21/21 0406 03/21/21 0917  BP: 133/86 131/76 118/82 (!) 121/92  Pulse: (!) 132 (!) 127 (!) 128 (!) 126  Resp: '18  18 17  '$ Temp: 98.1 F (36.7 C) 98 F (36.7 C) 98.2 F (36.8 C) 98.7 F (37.1 C)  TempSrc: Oral  Oral   SpO2: 90%  96% 95%  Weight:      Height:        Intake/Output Summary (Last 24 hours) at 03/21/2021 1150 Last data filed at 03/21/2021 1000 Gross per 24 hour  Intake 630 ml  Output 300 ml  Net 330 ml    Filed Weights   03/19/21 0509  Weight: 70 kg    Examination:  Constitutional: No distress, delirious Eyes: No icterus ENMT:mmm Neck: normal, supple Respiratory: Clear bilaterally, no wheezing Cardiovascular: Irregular, tachycardic, no edema Abdomen: Soft, NT, ND, bowel sounds positive Musculoskeletal: no clubbing / cyanosis.  Skin: No rashes Neurologic: Does not follow commands but flails all 4 independently and strength appears equal, no new focal deficits   Data Reviewed: I have independently reviewed following labs and imaging studies   CBC: Recent Labs  Lab 03/19/21 0516 03/20/21 0500 03/21/21 0231  WBC 11.0* 11.4* 9.1  NEUTROABS 10.3*  --   --   HGB 11.1* 10.4* 11.1*  HCT 35.3* 31.8* 34.3*  MCV 92.7 90.9 88.4  PLT 154 127* 0000000    Basic Metabolic Panel: Recent Labs  Lab 03/19/21 0516 03/20/21 0500 03/21/21 0231  NA 138 138 136  K 3.8 3.7 3.8  CL 109 113* 110  CO2 21* 18* 17*  GLUCOSE 91 83 96  BUN 27* 21 22  CREATININE 1.14 0.97 1.20   CALCIUM 8.8* 8.1* 8.5*    Liver Function Tests: Recent Labs  Lab 03/19/21 0516  AST 28  ALT 16  ALKPHOS 92  BILITOT 1.3*  PROT 5.7*  ALBUMIN 3.7    Coagulation Profile: Recent Labs  Lab 03/19/21 0516  INR 1.3*    HbA1C: No results for input(s): HGBA1C in the last 72 hours. CBG: No results for input(s): GLUCAP in the last 168 hours.  Recent Results (from the past 240 hour(s))  Resp Panel by RT-PCR (Flu A&B, Covid) Nasopharyngeal Swab     Status: None   Collection Time: 03/19/21  5:16 AM   Specimen: Nasopharyngeal Swab; Nasopharyngeal(NP) swabs in vial transport medium  Result Value Ref Range Status   SARS Coronavirus 2 by RT PCR NEGATIVE NEGATIVE Final  Comment: (NOTE) SARS-CoV-2 target nucleic acids are NOT DETECTED.  The SARS-CoV-2 RNA is generally detectable in upper respiratory specimens during the acute phase of infection. The lowest concentration of SARS-CoV-2 viral copies this assay can detect is 138 copies/mL. A negative result does not preclude SARS-Cov-2 infection and should not be used as the sole basis for treatment or other patient management decisions. A negative result may occur with  improper specimen collection/handling, submission of specimen other than nasopharyngeal swab, presence of viral mutation(s) within the areas targeted by this assay, and inadequate number of viral copies(<138 copies/mL). A negative result must be combined with clinical observations, patient history, and epidemiological information. The expected result is Negative.  Fact Sheet for Patients:  EntrepreneurPulse.com.au  Fact Sheet for Healthcare Providers:  IncredibleEmployment.be  This test is no t yet approved or cleared by the [place] FDA and  has been authorized for detection and/or diagnosis of SARS-CoV-2 by FDA under an Emergency Use Authorization (EUA). This EUA will remain  in effect (meaning this test can be used)  for the duration of the COVID-19 declaration under Section 564(b)(1) of the Act, 21 U.S.C.section 360bbb-3(b)(1), unless the authorization is terminated  or revoked sooner.       Influenza A by PCR NEGATIVE NEGATIVE Final   Influenza B by PCR NEGATIVE NEGATIVE Final    Comment: (NOTE) The Xpert Xpress SARS-CoV-2/FLU/RSV plus assay is intended as an aid in the diagnosis of influenza from Nasopharyngeal swab specimens and should not be used as a sole basis for treatment. Nasal washings and aspirates are unacceptable for Xpert Xpress SARS-CoV-2/FLU/RSV testing.  Fact Sheet for Patients: EntrepreneurPulse.com.au  Fact Sheet for Healthcare Providers: IncredibleEmployment.be  This test is not yet approved or cleared by the [place] FDA and has been authorized for detection and/or diagnosis of SARS-CoV-2 by FDA under an Emergency Use Authorization (EUA). This EUA will remain in effect (meaning this test can be used) for the duration of the COVID-19 declaration under Section 564(b)(1) of the Act, 21 U.S.C. section 360bbb-3(b)(1), unless the authorization is terminated or revoked.  Performed at [facility], [place] [address], [place], [place] [address]   Urine Culture     Status: Abnormal   Collection Time: 03/19/21  5:20 AM   Specimen: In/Out Cath Urine  Result Value Ref Range Status   Specimen Description IN/OUT CATH URINE  Final   Special Requests   Final    NONE Performed at [facility], [place] [address], [place], [place] [address]    Culture (A)  Final    10,000 COLONIES/mL MULTIPLE SPECIES PRESENT, SUGGEST RECOLLECTION   Report Status 03/20/2021 FINAL  Final  Blood Culture (routine x 2)     Status: None (Preliminary result)   Collection Time: 03/19/21  5:28 AM   Specimen: BLOOD  Result Value Ref Range Status   Specimen Description BLOOD RIGHT ANTECUBITAL  Final   Special Requests   Final    BOTTLES DRAWN AEROBIC AND  ANAEROBIC Blood Culture adequate volume   Culture   Final    NO GROWTH 2 DAYS Performed at [facility], [place] [address] [place],  [address]    Report Status PENDING  Incomplete  Blood Culture (routine x 2)     Status: None (Preliminary result)   Collection Time: 03/19/21  5:37 AM   Specimen: BLOOD RIGHT HAND  Result Value Ref Range Status   Specimen Description BLOOD RIGHT HAND  Final   Special Requests  Final    BOTTLES DRAWN AEROBIC AND ANAEROBIC Blood Culture results may not be optimal due to an inadequate volume of blood received in culture bottles   Culture   Final    NO GROWTH 2 DAYS Performed at [facility], [place] [address] [place], [place] [address]    Report Status PENDING  Incomplete      Radiology Studies: No results found.   Marzetta Board, MD, PhD [facility]  Between 7 am - 7 pm I am available, please contact me via Amion (for emergencies) or Securechat (non urgent messages)  Between 7 pm - 7 am I am not available, please contact night coverage MD/APP via Amion

## 2021-03-21 NOTE — NC FL2 (Signed)
[place] LEVEL OF CARE SCREENING TOOL     IDENTIFICATION  Patient Name: Jonathan Graham Birthdate: 1928-04-18 Sex: male Admission Date (Current Location): 03/19/2021  [facility] [place] Number:  Herbalist and Address:         Provider Number: [id number]  Attending Physician Name and Address:  Caren Griffins, MD  Relative Name and Phone Number:  Roxanne, Dyck (Spouse)   [phone] (Mobile)    Current Level of Care: Hospital Recommended Level of Care: [facility] Prior Approval Number: [id number]  Date Approved/Denied:   PASRR Number: [id number]  Discharge Plan: SNF    Current Diagnoses: Patient Active Problem List   Diagnosis Date Noted   Pressure injury of skin 03/21/2021   Protein-calorie malnutrition, severe 03/21/2021   Sepsis ([place]) 03/20/2021   Sepsis due to undetermined organism ([place]) 03/19/2021   Elevated troponin 03/19/2021   Dementia without behavioral disturbance ([place]) 03/19/2021   Malnutrition ([place]) 03/19/2021   DNR (do not resuscitate) 03/19/2021   Adult failure to thrive 09/04/2019   Weight loss 08/31/2019   NICM (nonischemic cardiomyopathy) ([place]) 08/31/2019   History of transcatheter aortic valve replacement (TAVR) 08/31/2019   Weakness    Spondylosis, cervical    Shortness of breath    S/P radiation therapy    History of mononucleosis    History of hiatal hernia    Heart murmur    Fatigue    Cervical spine disease    Back pain    Atypical chest pain    Arthritis    Orthostatic hypotension [id number]   Chronic systolic CHF (congestive heart failure) ([place]) 11/13/2016   Severe aortic stenosis 07/11/2015   Anemia 06/18/2013   Prostate cancer ([place]) 10/19/2012   Cancer ([place]) 08/31/2012   Osteoarthritis of right knee 01/23/2011   Varicose vein of leg 01/23/2011   Hypercholesterolemia    Left bundle branch block    Dizziness    Cardiovascular stress test abnormal 04/25/2004    Orientation  RESPIRATION BLADDER Height & Weight        Normal Incontinent, External catheter Weight: 154 lb 5.2 oz (70 kg) Height:  '6\' 1"'$  (185.4 cm)  BEHAVIORAL SYMPTOMS/MOOD NEUROLOGICAL BOWEL NUTRITION STATUS      Incontinent Diet (See d/c summary)  AMBULATORY STATUS COMMUNICATION OF NEEDS Skin   Extensive Assist Verbally PU Stage and Appropriate Care ([place]; mid stage 1)                       Personal Care Assistance Level of Assistance  Bathing, Feeding, Dressing Bathing Assistance: Maximum assistance Feeding assistance: Independent Dressing Assistance: Maximum assistance     Functional Limitations Info  Sight, Hearing, Speech Sight Info: Adequate Hearing Info: Adequate Speech Info: Adequate    SPECIAL CARE FACTORS FREQUENCY  PT (By licensed PT), OT (By licensed OT)     PT Frequency: 5x/week OT Frequency: 5x/week            Contractures Contractures Info: Not present    Additional Factors Info  Code Status, Allergies Code Status Info: DNR Allergies Info: Lipitor (atorvastatin Calcium), lisinopril           Current Medications (03/21/2021):  This is the current hospital active medication list Current Facility-Administered Medications  Medication Dose Route Frequency Provider Last Rate Last Admin   acetaminophen (TYLENOL) tablet 650 mg  650 mg Oral Q6H PRN Karmen Bongo, MD   650 mg at 03/19/21 2059   Or   acetaminophen (TYLENOL)  suppository 650 mg  650 mg Rectal Q6H PRN Karmen Bongo, MD       aspirin EC tablet 81 mg  81 mg Oral Daily Karmen Bongo, MD   81 mg at 03/21/21 [id number]   bisacodyl (DULCOLAX) EC tablet 5 mg  5 mg Oral Daily PRN Karmen Bongo, MD       ceFEPIme (MAXIPIME) 2 g in sodium chloride 0.9 % 100 mL IVPB  2 g Intravenous Lillia Mountain, MD 200 mL/hr at 03/21/21 1330 2 g at 03/21/21 1330   docusate sodium (COLACE) capsule 100 mg  100 mg Oral BID Karmen Bongo, MD   100 mg at 03/21/21 0828   enoxaparin (LOVENOX) injection 40 mg  40 mg  Subcutaneous Q24H Karmen Bongo, MD   40 mg at 03/20/21 1800   feeding supplement (ENSURE ENLIVE / ENSURE PLUS) liquid 237 mL  237 mL Oral TID BM Caren Griffins, MD   237 mL at 03/21/21 1338   lactated ringers infusion   Intravenous Continuous Karmen Bongo, MD   Stopped at 03/20/21 1008   metoprolol tartrate (LOPRESSOR) tablet 25 mg  25 mg Oral BID Caren Griffins, MD   25 mg at 03/21/21 [id number]   metroNIDAZOLE (FLAGYL) IVPB 500 mg  500 mg Intravenous Lynne Logan, MD 100 mL/hr at 03/21/21 0825 500 mg at 03/21/21 0825   multivitamin with minerals tablet 1 tablet  1 tablet Oral Daily Caren Griffins, MD   1 tablet at 03/21/21 1338   ondansetron (ZOFRAN) tablet 4 mg  4 mg Oral Q6H PRN Karmen Bongo, MD       Or   ondansetron [facility]) injection 4 mg  4 mg Intravenous Q6H PRN Karmen Bongo, MD       oxyCODONE (Oxy IR/ROXICODONE) immediate release tablet 5 mg  5 mg Oral Q4H PRN Karmen Bongo, MD       polyethylene glycol (MIRALAX / GLYCOLAX) packet 17 g  17 g Oral Daily PRN Karmen Bongo, MD       Derrill Memo ON 03/22/2021] QUEtiapine (SEROQUEL) tablet 25 mg  25 mg Oral QHS Gherghe, Costin M, MD       sodium chloride flush (NS) 0.9 % injection 3 mL  3 mL Intravenous Q12H Karmen Bongo, MD   3 mL at 03/21/21 0831   tamsulosin (FLOMAX) capsule 0.4 mg  0.4 mg Oral Daily Karmen Bongo, MD   0.4 mg at 03/21/21 [id number]     Discharge Medications: Please see discharge summary for a list of discharge medications.  Relevant Imaging Results:  Relevant Lab Results:   Additional Information SSN [id number] Pt is vaccinated x2, no booster  [employer], LCSW

## 2021-03-21 NOTE — TOC Initial Note (Addendum)
Transition of Care [facility]) - Initial/Assessment Note    Patient Details  Name: Jonathan Graham MRN: [id number] Date of Birth: Oct 08, 1927  [facility] CM/SW Contact:    Bethann Berkshire, [place] Phone Number: 03/21/2021, 2:33 PM  Clinical Narrative:                  CSW met with pt spouse and pt daughter bedside. Discussed disposition. SNF vs Memory care vs home with HH/Hospice. Family states that pt was previously walking 20-40 feet at home with a cane with limited energy. Pt lives at home with spouse and pt's daughter and son both  live 10 minutes away. Family states they are not able to care for pt at home at this time. Family is agreeable to SNF; they do not want [facility]; they mention interest in [facility]. Pt is covid vaccinated x2; no booster. CSW will complete fl2 and fax bed requests in hub. CSW does discuss potential long term options included Memory care/ALF. Family is familiar with [facility] as they had another family member go to memory care there. For now family wants pt to go to SNF for rehab and see how he does from there.    Expected Discharge Plan: Skilled Nursing Facility Barriers to Discharge: Continued Medical Work up   Patient Goals and CMS Choice Patient states their goals for this hospitalization and ongoing recovery are:: Family wants SNF      Expected Discharge Plan and Services Expected Discharge Plan: [place]       Living arrangements for the past 2 months: Single Family Home                                      Prior Living Arrangements/Services Living arrangements for the past 2 months: Single Family Home Lives with:: Spouse Patient language and need for interpreter reviewed:: Yes        Need for Family Participation in Patient Care: Yes (Comment) Care giver support system in place?: Yes (comment)   Criminal Activity/Legal Involvement Pertinent to Current Situation/Hospitalization: No - Comment as  needed  Activities of Daily Living Home Assistive Devices/Equipment: Eyeglasses, Grab bars in shower ADL Screening (condition at time of admission) Patient's cognitive ability adequate to safely complete daily activities?: No Is the patient deaf or have difficulty hearing?: Yes Does the patient have difficulty seeing, even when wearing glasses/contacts?: No Does the patient have difficulty concentrating, remembering, or making decisions?: Yes Patient able to express need for assistance with ADLs?: No Does the patient have difficulty dressing or bathing?: Yes Independently performs ADLs?: No Communication: Independent Dressing (OT): Needs assistance Is this a change from baseline?: Change from baseline, expected to last >3 days Grooming: Needs assistance Is this a change from baseline?: Change from baseline, expected to last >3 days Feeding: Needs assistance Is this a change from baseline?: Change from baseline, expected to last >3 days Bathing: Needs assistance Is this a change from baseline?: Change from baseline, expected to last >3 days Toileting: Needs assistance Is this a change from baseline?: Change from baseline, expected to last <3 days In/Out Bed: Needs assistance Is this a change from baseline?: Change from baseline, expected to last >3 days Walks in Home: Needs assistance Is this a change from baseline?: Change from baseline, expected to last >3 days Does the patient have difficulty walking or climbing stairs?: Yes Weakness of Legs: Both  Weakness of Arms/Hands: None  Permission Sought/Granted   Permission granted to share information with : Yes, Verbal Permission Granted  Share Information with NAME: Spouse, daughter, son           Emotional Assessment Appearance:: Appears stated age Attitude/Demeanor/Rapport: Sedated     Alcohol / Substance Use: Not Applicable Psych Involvement: No (comment)  Admission diagnosis:  Proctitis [K62.89] Sepsis ([place])  [A41.9] Sepsis due to undetermined organism ([place]) [A41.9] Sepsis, due to unspecified organism, unspecified whether acute organ dysfunction present [facility]) [A41.9] Patient Active Problem List   Diagnosis Date Noted   Pressure injury of skin 03/21/2021   Sepsis ([place]) 03/20/2021   Sepsis due to undetermined organism ([place]) 03/19/2021   Elevated troponin 03/19/2021   Dementia without behavioral disturbance ([place]) 03/19/2021   Malnutrition ([place]) 03/19/2021   DNR (do not resuscitate) 03/19/2021   Adult failure to thrive 09/04/2019   Weight loss 08/31/2019   NICM (nonischemic cardiomyopathy) ([place]) 08/31/2019   History of transcatheter aortic valve replacement (TAVR) 08/31/2019   Weakness    Spondylosis, cervical    Shortness of breath    S/P radiation therapy    History of mononucleosis    History of hiatal hernia    Heart murmur    Fatigue    Cervical spine disease    Back pain    Atypical chest pain    Arthritis    Orthostatic hypotension 03/52/4818   Chronic systolic CHF (congestive heart failure) ([place]) 11/13/2016   Severe aortic stenosis 07/11/2015   Anemia 06/18/2013   Prostate cancer ([place]) 10/19/2012   Cancer ([place]) 08/31/2012   Osteoarthritis of right knee 01/23/2011   Varicose vein of leg 01/23/2011   Hypercholesterolemia    Left bundle branch block    Dizziness    Cardiovascular stress test abnormal 04/25/2004   PCP:  [name], MD Pharmacy:   [id number] [id number]: [phone]: [phone] [facility]  [place]Phone: [phone]: [phone]    Social Determinants of Health (SDOH) Interventions    Readmission Risk Interventions No flowsheet data found.

## 2021-03-21 NOTE — Progress Notes (Signed)
Pharmacy Antibiotic Note  Jonathan Graham is a 87 y.o. male admitted on 03/19/2021 with sepsis/proctitis.  Pharmacy consulted for cefepime dosing. Scr bumped a little today to 1.2, current dosing still appropriate.  Plan: Cefepime 2g IV q12h -Monitor renal function, clinical status, and antibiotic plan  Height: '6\' 1"'$  (185.4 cm) Weight: 70 kg (154 lb 5.2 oz) IBW/kg (Calculated) : 79.9  Temp (24hrs), Avg:98.3 F (36.8 C), Min:98 F (36.7 C), Max:98.8 F (37.1 C)  Recent Labs  Lab 03/19/21 0516 03/19/21 0815 03/20/21 0500 03/21/21 0231  WBC 11.0*  --  11.4* 9.1  CREATININE 1.14  --  0.97 1.20  LATICACIDVEN 2.0* 1.2  --   --      Estimated Creatinine Clearance: 39.7 mL/min (by C-G formula based on SCr of 1.2 mg/dL).    Allergies  Allergen Reactions   Lipitor [Atorvastatin Calcium] Other (See Comments)    MYALGIAS CRAMPS   Lisinopril Cough    Antimicrobials this admission: Cefepime 7/20 >>  Flagyl 7/20 >> CTX x 1 dose  Microbiology results: 7/20 BCx: ng x 2d 7/20 UCx: mult species, needs recollect   Thank you for allowing [place] to participate in this patients care.  Jens Som, PharmD 03/21/2021 9:00 AM  Please check [email or web address] for unit-specific pharmacy phone numbers.

## 2021-03-21 NOTE — Evaluation (Addendum)
Occupational Therapy Evaluation Patient Details Name: Jonathan Graham MRN: [id number] DOB: [birth year] Today's Date: 03/21/2021    History of Present Illness Pt presenting to ED on 03/19/2021 after a fall at home. PMH significant for dementia, arthritis, prostate cancer, heart murmur, heart cath, and back surgery.   Clinical Impression   PTA, per chart review pt was living with his wife. Currently, pt performing UB ADLs with max-total A, and LB ADLs with total A +2. Pt performing sit<>stand transfers with total A +2 for physical assistance. Pt following simple one step commands with increased time. Pt presenting with decreased cognition, balance, activity tolerance, and strength. Recommend discharge with OT at SNF and will continue to follow acutely as admitted.     Follow Up Recommendations  SNF;Supervision/Assistance - 24 hour (Would benefit most from home environment, but unsure of pt level of support.)    Equipment Recommendations  Other (comment) (Defer to next venue)    Recommendations for Other Services       Precautions / Restrictions Precautions Precautions: None Precaution Comments: delirium precautions Restrictions Weight Bearing Restrictions: No      Mobility Bed Mobility Overal bed mobility: Needs Assistance Bed Mobility: Supine to Sit;Sit to Supine     Supine to sit: Max assist;+2 for physical assistance Sit to supine: Total assist;+2 for physical assistance        Transfers Overall transfer level: Needs assistance Equipment used: 2 person hand held assist Transfers: Sit to/from Stand Sit to Stand: Total assist;+2 physical assistance         General transfer comment: Total A+2 for sit<>stand    Balance Overall balance assessment: Needs assistance Sitting-balance support: No upper extremity supported;Feet supported Sitting balance-Leahy Scale: Poor     Standing balance support: Bilateral upper extremity supported;During functional  activity Standing balance-Leahy Scale: Zero Standing balance comment: Total A+2 for standing balance. Pt unable to achieve upright posture despite cues.                           ADL either performed or assessed with clinical judgement   ADL Overall ADL's : Needs assistance/impaired Eating/Feeding: Total assistance;Bed level   Grooming: Wash/dry face;Sitting;Maximal assistance Grooming Details (indicate cue type and reason): Pt following command to wash face sitting EOB. Fatiguing quickly and requiring max A for sitting balance. Upper Body Bathing: Cueing for sequencing;Sitting;Maximal assistance   Lower Body Bathing: Cueing for sequencing;Sitting/lateral leans;Total assistance   Upper Body Dressing : Maximal assistance;Cueing for sequencing;Sitting   Lower Body Dressing: Sitting/lateral leans;Total assistance   Toilet Transfer: +2 for physical assistance;+2 for safety/equipment;BSC;Stand-pivot;Total assistance   Toileting- Clothing Manipulation and Hygiene: Total assistance;+2 for safety/equipment;+2 for physical assistance;Sit to/from stand       Functional mobility during ADLs: Total assistance General ADL Comments: Pt following simple commands to wash face. Pt participative and conversational, but decreased command following with fatigue. Requiring max A for UB ADL and total A for LB ADLs. Max A for sitting balance after sitting for 2 minutes.     Vision Baseline Vision/History: Wears glasses Additional Comments: decreased vision. Requiring verbal cues to open eyes.     Perception     Praxis      Pertinent Vitals/Pain Pain Assessment: Faces Faces Pain Scale: Hurts a little bit Pain Location: R knee Pain Descriptors / Indicators: Discomfort Pain Intervention(s): Limited activity within patient's tolerance;Monitored during session;Repositioned     Hand Dominance     Extremity/Trunk Assessment Upper Extremity Assessment Upper  Extremity Assessment:  Generalized weakness   Lower Extremity Assessment Lower Extremity Assessment: Defer to PT evaluation   Cervical / Trunk Assessment Cervical / Trunk Assessment: Normal   Communication Communication Communication: HOH   Cognition Arousal/Alertness: Awake/alert;Lethargic Behavior During Therapy: WFL for tasks assessed/performed Overall Cognitive Status: No family/caregiver present to determine baseline cognitive functioning                                 General Comments: Baseline dementia. Unsure of cognitive baseline. Able to participate in short conversations about prior life events (including prior career or wife). Fatigues quickly and then closing his eyes. Following simple one step commands with increased time.   General Comments  Pt pleasant and conversational througout, although confused.    Exercises     Shoulder Instructions      Home Living Family/patient expects to be discharged to:: Private residence Living Arrangements: Spouse/significant other                               Additional Comments: Pt unable to participate in history taking. Lives with his spouse per chart review      Prior Functioning/Environment Level of Independence: Needs assistance        Comments: Unsure of PLOF        OT Problem List: Decreased strength;Decreased activity tolerance;Impaired balance (sitting and/or standing);Impaired vision/perception;Decreased cognition;Pain      OT Treatment/Interventions: Self-care/ADL training;Therapeutic exercise;Therapeutic activities;Patient/family education;DME and/or AE instruction    OT Goals(Current goals can be found in the care plan section) Acute Rehab OT Goals Patient Stated Goal: unable to state OT Goal Formulation: With patient Time For Goal Achievement: 03/21/21 Potential to Achieve Goals: Good  OT Frequency: Min 2X/week   Barriers to D/C:            Co-evaluation PT/OT/SLP Co-Evaluation/Treatment:  Yes Reason for Co-Treatment: Complexity of the patient's impairments (multi-system involvement);Necessary to address cognition/behavior during functional activity;For patient/therapist safety;To address functional/ADL transfers PT goals addressed during session: Mobility/safety with mobility;Balance OT goals addressed during session: ADL's and self-care;Proper use of Adaptive equipment and DME      AM-PAC OT "6 Clicks" Daily Activity     Outcome Measure Help from another person eating meals?: Total Help from another person taking care of personal grooming?: A Lot Help from another person toileting, which includes using toliet, bedpan, or urinal?: Total Help from another person bathing (including washing, rinsing, drying)?: A Lot Help from another person to put on and taking off regular upper body clothing?: A Lot Help from another person to put on and taking off regular lower body clothing?: Total 6 Click Score: 9   End of Session Equipment Utilized During Treatment: Gait belt Nurse Communication: Mobility status  Activity Tolerance: Patient limited by fatigue Patient left: in bed;with call bell/phone within reach;with bed alarm set;with nursing/sitter in room;with restraints reapplied  OT Visit Diagnosis: Unsteadiness on feet (R26.81);Muscle weakness (generalized) (M62.81);Other symptoms and signs involving cognitive function;Pain Pain - Right/Left: Right Pain - part of body: Knee                Time: [id number] OT Time Calculation (min): 35 min Charges:  OT General Charges $OT Visit: 1 Visit OT Evaluation $OT Eval Moderate Complexity: [place], OTDS   Shanda Howells 03/21/2021, 11:07 AM

## 2021-03-21 NOTE — Progress Notes (Signed)
Physical Therapy Evaluation   Clinical Impression:  Pt working with OT on entry, evaluation limited by dementia and associated decreased command follow and response to questions. Pt with increased generalized weakness and possible limited ROM/pain in knees. Pt requires total Ax2 for attempt to come to standing and is unable to come to fully upright despite maximal multimodal cuing. Pt also total Ax2 for return to bed. Pt will likely do best in his home environment if 24 hour care for mobility and self care is available, if proper assist is not available pt would benefit from SNF level rehab in a memory care environment. PT will continue to follow acutely.    03/21/21 1000  PT Visit Information  Last PT Received On 03/21/21  Assistance Needed +2  Reason for Co-Treatment Complexity of the patient's impairments (multi-system involvement);Necessary to address cognition/behavior during functional activity;For patient/therapist safety;To address functional/ADL transfers  PT goals addressed during session Mobility/safety with mobility;Balance  OT goals addressed during session ADL's and self-care;Proper use of Adaptive equipment and DME  History of Present Illness 87 yo male presenting to ED on 03/19/2021 after a fall at home. PMH significant for dementia, arthritis, prostate cancer, heart murmur, heart cath, and back surgery.  Precautions  Precautions None  Precaution Comments delirium precautions  Restrictions  Weight Bearing Restrictions No  Home Living  Family/patient expects to be discharged to: Private residence  Living Arrangements Spouse/significant other  Additional Comments Pt unable to participate in history taking. Lives with his spouse per chart review  Prior Function  Level of Independence Needs assistance  Comments Unsure of PLOF  Communication  Communication HOH  Pain Assessment  Pain Assessment Faces  Faces Pain Scale 2  Pain Location R knee  Pain Descriptors / Indicators  Discomfort  Pain Intervention(s) Limited activity within patient's tolerance;Monitored during session;Repositioned  Cognition  Arousal/Alertness Awake/alert;Lethargic  Behavior During Therapy WFL for tasks assessed/performed  Overall Cognitive Status No family/caregiver present to determine baseline cognitive functioning  General Comments Baseline dementia. Unsure of cognitive baseline. Able to participate in short conversations about prior life events (including prior career or wife). Fatigues quickly and then closing his eyes. Following simple one step commands with increased time.  Upper Extremity Assessment  Upper Extremity Assessment Generalized weakness  Lower Extremity Assessment  Lower Extremity Assessment Generalized weakness  Cervical / Trunk Assessment  Cervical / Trunk Assessment Normal  Bed Mobility  Overal bed mobility Needs Assistance  Bed Mobility Supine to Sit;Sit to Supine  Supine to sit Max assist;+2 for physical assistance  Sit to supine Total assist;+2 for physical assistance  General bed mobility comments unable to follow commands for coming down on elbow and rolls onto back before LE can be lifted to bed  Transfers  Overall transfer level Needs assistance  Equipment used 2 person hand held assist  Transfers Sit to/from Stand  Sit to Stand Total assist;+2 physical assistance  General transfer comment Total A+2 for sit<>stand  Balance  Overall balance assessment Needs assistance  Sitting-balance support No upper extremity supported;Feet supported  Sitting balance-Leahy Scale Poor  Standing balance support Bilateral upper extremity supported;During functional activity  Standing balance-Leahy Scale Zero  Standing balance comment Total A+2 for standing balance. Pt unable to achieve upright posture despite cues.  General Comments  General comments (skin integrity, edema, etc.) VSS on RA, prefers to keep eyes closed throughout session (Simultaneous filing. User may  not have seen previous data.)  PT - End of Session  Equipment Utilized During Treatment Gait  belt  Activity Tolerance Other (comment);Patient limited by fatigue (limited by cognition)  Patient left in bed;with call bell/phone within reach;with nursing/sitter in room;with restraints reapplied;with bed alarm set  Nurse Communication Mobility status  PT Assessment  PT Recommendation/Assessment Patient needs continued PT services  PT Visit Diagnosis Unsteadiness on feet (R26.81);Muscle weakness (generalized) (M62.81);Other abnormalities of gait and mobility (R26.89);History of falling (Z91.81);Repeated falls (R29.6);Difficulty in walking, not elsewhere classified (R26.2)  PT Problem List Decreased strength;Decreased range of motion;Decreased activity tolerance;Decreased balance;Decreased mobility;Decreased coordination;Decreased cognition;Decreased safety awareness  Barriers to Discharge Decreased caregiver support  PT Plan  PT Frequency (ACUTE ONLY) Min 3X/week  PT Treatment/Interventions (ACUTE ONLY) DME instruction;Gait training;Functional mobility training;Therapeutic activities;Therapeutic exercise;Balance training;Cognitive remediation;Patient/family education  AM-PAC PT "6 Clicks" Mobility Outcome Measure (Version 2)  Help needed turning from your back to your side while in a flat bed without using bedrails? 1  Help needed moving from lying on your back to sitting on the side of a flat bed without using bedrails? 1  Help needed moving to and from a bed to a chair (including a wheelchair)? 1  Help needed standing up from a chair using your arms (e.g., wheelchair or bedside chair)? 1  Help needed to walk in hospital room? 1  Help needed climbing 3-5 steps with a railing?  1  6 Click Score 6  Consider Recommendation of Discharge To: CIR/SNF/LTACH  Progressive Mobility  What is the highest level of mobility based on the progressive mobility assessment? Level 1 (Bedfast) - Unable to balance  while sitting on edge of bed  Mobility Sit up in bed/chair position for meals  PT Recommendation  Follow Up Recommendations SNF;Supervision/Assistance - 24 hour (Memory Care)  PT equipment Other (comment) (TBD)  Individuals Consulted  Consulted and Agree with Results and Recommendations Patient unable/family or caregiver not available  Acute Rehab PT Goals  Patient Stated Goal unabel to state (Simultaneous filing. User may not have seen previous data.)  PT Goal Formulation Patient unable to participate in goal setting  Time For Goal Achievement 04/04/21  Potential to Achieve Goals Poor  PT Time Calculation  PT Start Time (ACUTE ONLY) [id number]  PT Stop Time (ACUTE ONLY) [id number]  PT Time Calculation (min) (ACUTE ONLY) 14 min  PT General Charges  $$ ACUTE PT VISIT 1 Visit  PT Evaluation  $PT Eval Moderate Complexity 1 Mod    Markayla Reichart B. Migdalia Dk PT, DPT Acute Rehabilitation Services Pager [phone] Office [phone]

## 2021-03-22 DIAGNOSIS — A419 Sepsis, unspecified organism: Secondary | ICD-10-CM | POA: Diagnosis not present

## 2021-03-22 DIAGNOSIS — Z515 Encounter for palliative care: Secondary | ICD-10-CM

## 2021-03-22 DIAGNOSIS — R778 Other specified abnormalities of plasma proteins: Secondary | ICD-10-CM | POA: Diagnosis not present

## 2021-03-22 LAB — CBC
HCT: 34.5 % — ABNORMAL LOW (ref 39.0–52.0)
Hemoglobin: 11.4 g/dL — ABNORMAL LOW (ref 13.0–17.0)
MCH: 28.9 pg (ref 26.0–34.0)
MCHC: 33 g/dL (ref 30.0–36.0)
MCV: 87.6 fL (ref 80.0–100.0)
Platelets: 160 10*3/uL (ref 150–400)
RBC: 3.94 MIL/uL — ABNORMAL LOW (ref 4.22–5.81)
RDW: 15.1 % (ref 11.5–15.5)
WBC: 9 10*3/uL (ref 4.0–10.5)
nRBC: 0 % (ref 0.0–0.2)

## 2021-03-22 LAB — BASIC METABOLIC PANEL
Anion gap: 9 (ref 5–15)
BUN: 20 mg/dL (ref 8–23)
CO2: 17 mmol/L — ABNORMAL LOW (ref 22–32)
Calcium: 8.3 mg/dL — ABNORMAL LOW (ref 8.9–10.3)
Chloride: 111 mmol/L (ref 98–111)
Creatinine, Ser: 1.06 mg/dL (ref 0.61–1.24)
GFR, Estimated: >60 mL/min (ref >60)
Glucose, Bld: 95 mg/dL (ref 70–99)
Potassium: 3.5 mmol/L (ref 3.5–5.1)
Sodium: 137 mmol/L (ref 135–145)

## 2021-03-22 MED ORDER — METRONIDAZOLE 500 MG PO TABS
500.0000 mg | ORAL_TABLET | Freq: Three times a day (TID) | ORAL | Status: DC
  Administered 2021-03-22 – 2021-03-26 (×11): 500 mg via ORAL
  Filled 2021-03-22 (×12): qty 1

## 2021-03-22 MED ORDER — AMOXICILLIN-POT CLAVULANATE 875-125 MG PO TABS
1.0000 | ORAL_TABLET | Freq: Two times a day (BID) | ORAL | Status: DC
  Administered 2021-03-22 – 2021-03-26 (×9): 1 via ORAL
  Filled 2021-03-22 (×9): qty 1

## 2021-03-22 MED ORDER — LACTATED RINGERS IV SOLN: INTRAVENOUS | Status: AC

## 2021-03-22 MED ORDER — CIPROFLOXACIN HCL 500 MG PO TABS: 500.0000 mg | ORAL_TABLET | Freq: Two times a day (BID) | ORAL | Status: DC

## 2021-03-22 MED ORDER — POTASSIUM CHLORIDE CRYS ER 20 MEQ PO TBCR
40.0000 meq | EXTENDED_RELEASE_TABLET | Freq: Once | ORAL | Status: AC
  Administered 2021-03-22: 40 meq via ORAL
  Filled 2021-03-22: qty 2

## 2021-03-22 NOTE — Consult Note (Signed)
Consultation Note Date: 03/22/2021   Patient Name: Jonathan Graham  DOB: 02-20-1928  MRN: [id number]  Age / Sex: 87 y.o., male  PCP: [name], MD Referring Physician: Damita Lack, MD  Reason for Consultation: Establishing goals of care  HPI/Patient Profile: 87 y.o. male  with past medical history of  AS s/p TAVR, CHF EF 30-35%, prostate cancer, dementia, and malnutrition who was admitted on 03/19/2021 with sepsis likely due to proctitis.  Labs reveals a pro calcitonin of  3.25, troponin peak of 586, and CT showed rectal wall thickening c/w proctitis.  His hospital course was complicated by delirium.  Clinical Assessment and Goals of Care: I have reviewed medical records including EPIC notes, labs and imaging, received report from RN, assessed the patient and then met at the bedside along with the patient and his wife [name] to discuss diagnosis prognosis, GOC, EOL wishes, disposition and options.  I introduced Palliative Medicine as specialized medical care for people living with serious illness. It focuses on providing relief from the symptoms and stress of a serious illness. The goal is to improve quality of life for both the patient and the family.  We discussed a brief life review of the patient and then focused on their current illness. The natural disease trajectory and expectations at EOL were discussed.   Mr. Fosnaugh greets me with a beaming smile.  He seems very pleasant and in no distress.  His wife [name] tells me he is like that all the time, even when he is sick or in pain.   [name] is 23.  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  [name] shares the story of the "miracle man" who was on hospice previously for heart disease but was able to graduate off.  He normally walks with a cane at home and is pleasantly confused at baseline.  Mr. 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  Sam Rayburn have two children, and 6 grandchildren.    I attempted to elicit values and goals of care important to the patient.  [name] talked with me about SNF rehab for [facility].  She is hopeful he will regain some strength with PT.   I asked [name] if she could would she prefer to take Hailey home with Hospice support rather than have him go to SNF?  She told me she would and that he would prefer that as well, but she needed to involve her children in the decision.    I suggested we have a family meeting tomorrow morning.  [name] called her daughter and scheduled a time.  The difference between aggressive medical intervention and comfort care was considered in light of the patient's goals of care.   We briefly reviewed a MOST form and I left a copy with [name] so that she could consider it further  before our meeting tomorrow.  Discussed the importance of continued conversation with family and the medical providers regarding overall plan of care and treatment options, ensuring decisions are within the context of the patient's values and GOCs.    Questions and concerns were addressed. The family was encouraged to call with questions or concerns.  PMT will continue to support holistically.  Primary Decision Maker:  NEXT OF KIN patient's wife Billie    SUMMARY OF RECOMMENDATIONS   PMT family meeting tomorrow morning at 10:00 (or 11:00) to discuss the patient's condition, trajectory and disposition options.  Code Status/Advance Care Planning: DNR   Symptom Management:  Patient is currently at baseline and not in pain or uncomfortable.  Additional Recommendations (Limitations, Scope, Preferences): Full Scope Treatment  Palliative Prophylaxis:  Delirium Protocol  Psycho-social/Spiritual:  Desire for further Chaplaincy support:  Not yet discussed.  Prognosis:  less than 6 months would not be surprising given sepsis, cachexia, heart disease and goals of care.   Discharge Planning: To Be Determined  SNF with Palliative vs Home with Hospice.      Primary Diagnoses: Present on Admission:  Sepsis due to undetermined organism [facility])  Prostate cancer (HCC)  Elevated troponin  Hypercholesterolemia  Chronic systolic CHF (congestive heart failure) (HCC)  History of transcatheter aortic valve replacement (TAVR)  Dementia without behavioral disturbance ([place])  Malnutrition ([place])  DNR (do not resuscitate)  Sepsis ([place])   I have reviewed the medical record, interviewed the patient and family, and examined the patient. The following aspects are pertinent.  Past Medical History:  Diagnosis Date   Arthritis    Atypical chest pain    history of    Back pain    Cancer ([place]) 2014   prostate   Cardiovascular stress test abnormal 04/25/2004   Abnormal adenosine Cardiolite study / evidence of inferoseptal ischemia &  an EF of 48% ""recommended that he undergo cardiac catheterization""   Cervical spine disease    Dementia (HCC)    Dizziness    occasionally apon bending over   DNR (do not resuscitate) 03/19/2021   Fatigue    history of    Heart murmur    History of hiatal hernia    History of mononucleosis    Hypercholesterolemia    Left bundle branch block    Pleurisy 07/2005   S/P radiation therapy  12/05/2012 through 01/30/2013                                                         Prostate 7800 cGy 40 sessions, seminal vesicles 5600 cGy 40 sessions                        Shortness of breath    Spondylosis, cervical    at C3-C4   Varicose veins    Weakness    history of    Social History   Socioeconomic History   Marital status: Married    Spouse name: Not on file   Number of children: 2   Years of education: Not on file   Highest education level: Not on file  Occupational History   Occupation:  Insurance    Comment: Financial planner  Tobacco Use   Smoking status: Former    Packs/day: 1.00    Years: 2.00  Pack years: 2.00    Types: Cigarettes    Start date: 08/31/1949    Quit date: 09/01/1951    Years since quitting: 69.6   Smokeless tobacco: Never  Vaping Use   Vaping Use: Never used  Substance and Sexual Activity   Alcohol use: No   Drug use: No   Sexual activity: Not on file  Other Topics Concern   Not on file  Social History Narrative   Not on file   Social Determinants of Health   Financial Resource Strain: Low Risk    Difficulty of Paying Living Expenses: Not hard at all  Food Insecurity: No Food Insecurity   Worried About Charity fundraiser in the Last Year: Never true   [place] in the Last Year: Never true  Transportation Needs: No Transportation Needs   Lack of Transportation (Medical): No   Lack of Transportation (Non-Medical): No  Physical Activity: Inactive   Days of Exercise per Week: 0 days   Minutes of Exercise per Session: 0 min  Stress: No Stress Concern Present   Feeling of Stress : Not at all  Social Connections: Socially Isolated   Frequency of Communication with Friends and Family: Once a week   Frequency of Social Gatherings with Friends and Family: Once a week   Attends Religious Services: Never   Marine scientist or Organizations: No   Attends Music therapist: Never   Marital Status: Married   Family History  Problem Relation Age of Onset   Stroke Father    Cancer Father    Heart failure Mother    Heart disease Mother    Multiple sclerosis Sister    Lung cancer Brother     Allergies  Allergen Reactions   Lipitor [Atorvastatin Calcium] Other (See Comments)    MYALGIAS CRAMPS   Lisinopril Cough     Vital Signs: BP 113/80 (BP Location: Right Arm)   Pulse (!) 133   Temp 98.6 F (37 C) (Oral)   Resp 18   Ht _0  (1.854 m)   Wt 70 kg   SpO2 90%   BMI 20.36 kg/m  Pain Scale: 0-10    Pain Score: 0-No pain   SpO2: SpO2: 90 % O2 Device:SpO2: 90 % O2 Flow Rate: .     Palliative Assessment/Data: 30%     Time In: 5:00 Time Out: 6:00 Time Total: 60 min. Visit consisted of counseling and education dealing with the complex and emotionally intense issues surrounding the need for palliative care and symptom management in the setting of serious and potentially life-threatening illness. Greater than 50%  of this time was spent counseling and coordinating care related to the above assessment and plan.  Signed by: Florentina Jenny, PA-C Palliative Medicine  Please contact Palliative Medicine Team phone at [phone] for questions and concerns.  For individual provider: See Shea Evans

## 2021-03-22 NOTE — Progress Notes (Signed)
PROGRESS NOTE    Jonathan Graham  [id number] DOB: 1927-09-25 DOA: 03/19/2021 PCP: [name], MD   Brief Narrative:  87 year old male with history of prostate cancer status post XRT, dementia, history of TAVR, hyperlipidemia comes in with a fall.  Wife reports that he has been weaker, started to have severe shaking, felt like he needed to get up and walk but had no energy to do so.  Family called EMS, he was febrile and was brought to the hospital.  He was febrile in the ED to 102, has been having intermittent constipation, and a CT scan of the abdomen pelvis showed proctitis as a potential source.  He was placed on antibiotics and admitted to the hospital.  Initially patient started on cefepime and Flagyl.  Developed hospital delirium therefore started on Seroquel.  PT recommended SNF.   Assessment & Plan:   Principal Problem:   Sepsis due to undetermined organism [facility]) Active Problems:   Hypercholesterolemia   Prostate cancer ([place])   Chronic systolic CHF (congestive heart failure) (HCC)   History of transcatheter aortic valve replacement (TAVR)   Elevated troponin   Dementia without behavioral disturbance ([place])   Malnutrition ([place])   DNR (do not resuscitate)   Sepsis ([place])   Pressure injury of skin   Protein-calorie malnutrition, severe    Sepsis, presumed due to proctitis - -Sepsis physiology improving.  CT abdomen pelvis consistent with proctitis. - Narrow antibiotics-p.o. Cipro and Flagyl X 7 days - Gentle hydration   In hospital delirium, acute metabolic encephalopathy with underlying dementia -Currently patient is on Seroquel 25 mg at bedtime.  Will adjust as necessary.  Delirium precautions.  Mittens in place as he is trying to remove IV lines   A. fib with RVR- -His rate has improved.  Continue metoprolol - Not a candidate for anticoagulation due to dementia and falls   Chronic systolic CHF  -seen by cardiology as an outpatient, most recently echo  showed EF 30-35%, unchanged.  He is on no meds due to chronic hypotension but does not seem to have this issue here.  Tolerated fluid bolus in the ED well, hold additional fluids given that he is normotensive   Aortic valve stenosis -status post TAVR   Elevated troponin -demand ischemia.  Remains chest pain-free.   Malnutrition, goals of care -Previous provider discussed with the daughter at bedside that he has been declining significantly over the last year.  He has lost a lot of weight, is less mobile and he is not eating as well.  Discussed at length that this may be due to the trajectory of his dementia and at this point it can be considered a terminal illness.  If he recovers following this hospital stay may be very appropriate for home with hospice to which daughter is agreeable, however today she tells me that he is too weak for home now and she would like to pursue SNF. TOC consulted, awaiting placement  Severe protein calorie malnutrition Nutritional status  Nutrition Problem: Severe Malnutrition Etiology: chronic illness (dementia)  Signs/Symptoms: severe fat depletion, severe muscle depletion  Interventions: Ensure Enlive (each supplement provides 350kcal and 20 grams of protein), Magic cup, MVI  Body mass index is 20.36 kg/m.  DVT prophylaxis: enoxaparin (LOVENOX) injection 40 mg Start: 03/19/21 1800 Code Status: DNR Family Communication:    Status is: Inpatient  Remains inpatient appropriate because:Unsafe d/c plan  Dispo: The patient is from: Home  Anticipated d/c is to: SNF              Patient currently is medically stable to d/c.  Awaiting placement.   Difficult to place patient No        Pressure Injury 03/20/21 Sacrum Mid Stage 1 -  Intact skin with non-blanchable redness of a localized area usually over a bony prominence. reddened blanchable area sacral (Active)  03/20/21 1730  Location: Sacrum  Location Orientation: Mid  Staging: Stage 1  -  Intact skin with non-blanchable redness of a localized area usually over a bony prominence.  Wound Description (Comments): reddened blanchable area sacral  Present on Admission: Yes      Subjective: Seen and examined at bedside.  Patient does not have any complaints.  He is in his supplements attempting to get out of it.  Easily directable at this time  Review of Systems Otherwise negative except as per HPI, including: General: Denies fever, chills, night sweats or unintended weight loss. Resp: Denies cough, wheezing, shortness of breath. Cardiac: Denies chest pain, palpitations, orthopnea, paroxysmal nocturnal dyspnea. GI: Denies abdominal pain, nausea, vomiting, diarrhea or constipation GU: Denies dysuria, frequency, hesitancy or incontinence MS: Denies muscle aches, joint pain or swelling Neuro: Denies headache, neurologic deficits (focal weakness, numbness, tingling), abnormal gait Psych: Denies anxiety, depression, SI/HI/AVH Skin: Denies new rashes or lesions ID: Denies sick contacts, exotic exposures, travel  Examination:  Constitutional: Not in acute distress, elderly frail.  Bilateral upper extremity mittens in place Respiratory: Clear to auscultation bilaterally Cardiovascular: Normal sinus rhythm, no rubs Abdomen: Nontender nondistended good bowel sounds Musculoskeletal: No edema noted Skin: No rashes seen Neurologic: CN 2-12 grossly intact.  And nonfocal Psychiatric: Alert to name-close to baseline.  Poor judgment and insight    Objective: Vitals:   03/21/21 2224 03/22/21 0117 03/22/21 0514 03/22/21 0946  BP: 127/72 128/81 121/88 120/82  Pulse: 67 72 (!) 129 (!) 133  Resp: '15 18 14 17  '$ Temp: 99.1 F (37.3 C) 98.6 F (37 C) 99.6 F (37.6 C) 98.6 F (37 C)  TempSrc: Oral Oral Oral Oral  SpO2: 97% 95% 90%   Weight:      Height:        Intake/Output Summary (Last 24 hours) at 03/22/2021 1005 Last data filed at 03/22/2021 0933 Gross per 24 hour  Intake  200 ml  Output 1175 ml  Net -975 ml   Filed Weights   03/19/21 0509  Weight: 70 kg     Data Reviewed:   CBC: Recent Labs  Lab 03/19/21 0516 03/20/21 0500 03/21/21 0231 03/22/21 0054  WBC 11.0* 11.4* 9.1 9.0  NEUTROABS 10.3*  --   --   --   HGB 11.1* 10.4* 11.1* 11.4*  HCT 35.3* 31.8* 34.3* 34.5*  MCV 92.7 90.9 88.4 87.6  PLT 154 127* 153 0000000   Basic Metabolic Panel: Recent Labs  Lab 03/19/21 0516 03/20/21 0500 03/21/21 0231 03/22/21 0054  NA 138 138 136 137  K 3.8 3.7 3.8 3.5  CL 109 113* 110 111  CO2 21* 18* 17* 17*  GLUCOSE 91 83 96 95  BUN 27* '21 22 20  '$ CREATININE 1.14 0.97 1.20 1.06  CALCIUM 8.8* 8.1* 8.5* 8.3*   GFR: Estimated Creatinine Clearance: 44.9 mL/min (by C-G formula based on SCr of 1.06 mg/dL). Liver Function Tests: Recent Labs  Lab 03/19/21 0516  AST 28  ALT 16  ALKPHOS 92  BILITOT 1.3*  PROT 5.7*  ALBUMIN 3.7   No results  for input(s): LIPASE, AMYLASE in the last 168 hours. No results for input(s): AMMONIA in the last 168 hours. Coagulation Profile: Recent Labs  Lab 03/19/21 0516  INR 1.3*   Cardiac Enzymes: No results for input(s): CKTOTAL, CKMB, CKMBINDEX, TROPONINI in the last 168 hours. BNP (last 3 results) No results for input(s): PROBNP in the last 8760 hours. HbA1C: No results for input(s): HGBA1C in the last 72 hours. CBG: No results for input(s): GLUCAP in the last 168 hours. Lipid Profile: No results for input(s): CHOL, HDL, LDLCALC, TRIG, CHOLHDL, LDLDIRECT in the last 72 hours. Thyroid Function Tests: No results for input(s): TSH, T4TOTAL, FREET4, T3FREE, THYROIDAB in the last 72 hours. Anemia Panel: No results for input(s): VITAMINB12, FOLATE, FERRITIN, TIBC, IRON, RETICCTPCT in the last 72 hours. Sepsis Labs: Recent Labs  Lab 03/19/21 0516 03/19/21 0815 03/19/21 1245  PROCALCITON  --   --  3.25  LATICACIDVEN 2.0* 1.2  --     Recent Results (from the past 240 hour(s))  Resp Panel by RT-PCR (Flu A&B,  Covid) Nasopharyngeal Swab     Status: None   Collection Time: 03/19/21  5:16 AM   Specimen: Nasopharyngeal Swab; Nasopharyngeal(NP) swabs in vial transport medium  Result Value Ref Range Status   SARS Coronavirus 2 by RT PCR NEGATIVE NEGATIVE Final    Comment: (NOTE) SARS-CoV-2 target nucleic acids are NOT DETECTED.  The SARS-CoV-2 RNA is generally detectable in upper respiratory specimens during the acute phase of infection. The lowest concentration of SARS-CoV-2 viral copies this assay can detect is 138 copies/mL. A negative result does not preclude SARS-Cov-2 infection and should not be used as the sole basis for treatment or other patient management decisions. A negative result may occur with  improper specimen collection/handling, submission of specimen other than nasopharyngeal swab, presence of viral mutation(s) within the areas targeted by this assay, and inadequate number of viral copies(<138 copies/mL). A negative result must be combined with clinical observations, patient history, and epidemiological information. The expected result is Negative.  Fact Sheet for Patients:  EntrepreneurPulse.com.au  Fact Sheet for Healthcare Providers:  IncredibleEmployment.be  This test is no t yet approved or cleared by the [place] FDA and  has been authorized for detection and/or diagnosis of SARS-CoV-2 by FDA under an Emergency Use Authorization (EUA). This EUA will remain  in effect (meaning this test can be used) for the duration of the COVID-19 declaration under Section 564(b)(1) of the Act, 21 U.S.C.section 360bbb-3(b)(1), unless the authorization is terminated  or revoked sooner.       Influenza A by PCR NEGATIVE NEGATIVE Final   Influenza B by PCR NEGATIVE NEGATIVE Final    Comment: (NOTE) The Xpert Xpress SARS-CoV-2/FLU/RSV plus assay is intended as an aid in the diagnosis of influenza from Nasopharyngeal swab specimens and should  not be used as a sole basis for treatment. Nasal washings and aspirates are unacceptable for Xpert Xpress SARS-CoV-2/FLU/RSV testing.  Fact Sheet for Patients: EntrepreneurPulse.com.au  Fact Sheet for Healthcare Providers: IncredibleEmployment.be  This test is not yet approved or cleared by the [place] FDA and has been authorized for detection and/or diagnosis of SARS-CoV-2 by FDA under an Emergency Use Authorization (EUA). This EUA will remain in effect (meaning this test can be used) for the duration of the COVID-19 declaration under Section 564(b)(1) of the Act, 21 U.S.C. section 360bbb-3(b)(1), unless the authorization is terminated or revoked.  Performed at [facility], [place] [address] [place], [place] [address]  Urine Culture     Status: Abnormal   Collection Time: 03/19/21  5:20 AM   Specimen: In/Out Cath Urine  Result Value Ref Range Status   Specimen Description IN/OUT CATH URINE  Final   Special Requests   Final    NONE Performed at [facility], [address] [place], [place] [address]    Culture (A)  Final    10,000 COLONIES/mL MULTIPLE SPECIES PRESENT, SUGGEST RECOLLECTION   Report Status 03/20/2021 FINAL  Final  Blood Culture (routine x 2)     Status: None (Preliminary result)   Collection Time: 03/19/21  5:28 AM   Specimen: BLOOD  Result Value Ref Range Status   Specimen Description BLOOD RIGHT ANTECUBITAL  Final   Special Requests   Final    BOTTLES DRAWN AEROBIC AND ANAEROBIC Blood Culture adequate volume   Culture   Final    NO GROWTH 3 DAYS Performed at [facility], [place] [address] [place], [place] [address]    Report Status PENDING  Incomplete  Blood Culture (routine x 2)     Status: None (Preliminary result)   Collection Time: 03/19/21  5:37 AM   Specimen: BLOOD RIGHT HAND  Result Value Ref Range Status   Specimen Description BLOOD RIGHT HAND  Final   Special Requests   Final     BOTTLES DRAWN AEROBIC AND ANAEROBIC Blood Culture results may not be optimal due to an inadequate volume of blood received in culture bottles   Culture   Final    NO GROWTH 3 DAYS Performed at [facility], [place] [address] [place], [place] [address]    Report Status PENDING  Incomplete         Radiology Studies: No results found.      Scheduled Meds:  aspirin EC  81 mg Oral Daily   ciprofloxacin  500 mg Oral BID   docusate sodium  100 mg Oral BID   enoxaparin (LOVENOX) injection  40 mg Subcutaneous Q24H   feeding supplement  237 mL Oral TID BM   metoprolol tartrate  25 mg Oral BID   metroNIDAZOLE  500 mg Oral Q8H   multivitamin with minerals  1 tablet Oral Daily   QUEtiapine  25 mg Oral QHS   sodium chloride flush  3 mL Intravenous Q12H   tamsulosin  0.4 mg Oral Daily   Continuous Infusions:  lactated ringers 100 mL/hr at 03/22/21 0851     LOS: 2 days   Time spent= 35 mins    Jonathan Graham Arsenio Loader, MD [facility]  If 7PM-7AM, please contact night-coverage  03/22/2021, 10:05 AM

## 2021-03-23 DIAGNOSIS — G301 Alzheimer's disease with late onset: Secondary | ICD-10-CM | POA: Diagnosis not present

## 2021-03-23 DIAGNOSIS — C61 Malignant neoplasm of prostate: Secondary | ICD-10-CM

## 2021-03-23 DIAGNOSIS — E43 Unspecified severe protein-calorie malnutrition: Secondary | ICD-10-CM

## 2021-03-23 DIAGNOSIS — I5022 Chronic systolic (congestive) heart failure: Secondary | ICD-10-CM

## 2021-03-23 DIAGNOSIS — A419 Sepsis, unspecified organism: Secondary | ICD-10-CM | POA: Diagnosis not present

## 2021-03-23 DIAGNOSIS — F0280 Dementia in other diseases classified elsewhere without behavioral disturbance: Secondary | ICD-10-CM

## 2021-03-23 LAB — MAGNESIUM: Magnesium: 1.7 mg/dL (ref 1.7–2.4)

## 2021-03-23 LAB — BASIC METABOLIC PANEL
Anion gap: 8 (ref 5–15)
BUN: 19 mg/dL (ref 8–23)
CO2: 18 mmol/L — ABNORMAL LOW (ref 22–32)
Calcium: 8.3 mg/dL — ABNORMAL LOW (ref 8.9–10.3)
Chloride: 109 mmol/L (ref 98–111)
Creatinine, Ser: 0.95 mg/dL (ref 0.61–1.24)
GFR, Estimated: >60 mL/min (ref >60)
Glucose, Bld: 123 mg/dL — ABNORMAL HIGH (ref 70–99)
Potassium: 4 mmol/L (ref 3.5–5.1)
Sodium: 135 mmol/L (ref 135–145)

## 2021-03-23 MED ORDER — METOPROLOL TARTRATE 5 MG/5ML IV SOLN
5.0000 mg | INTRAVENOUS | Status: DC | PRN
  Filled 2021-03-23: qty 5

## 2021-03-23 MED ORDER — WHITE PETROLATUM EX OINT
TOPICAL_OINTMENT | CUTANEOUS | Status: AC
  Administered 2021-03-23: 0.2
  Filled 2021-03-23: qty 28.35

## 2021-03-23 MED ORDER — MAGNESIUM SULFATE IN D5W 1-5 GM/100ML-% IV SOLN
1.0000 g | Freq: Once | INTRAVENOUS | Status: AC
  Administered 2021-03-23: 1 g via INTRAVENOUS
  Filled 2021-03-23: qty 100

## 2021-03-23 NOTE — Progress Notes (Signed)
PROGRESS NOTE    Jonathan Graham  [id number] DOB: 1928-02-09 DOA: 03/19/2021 PCP: [name], MD   Brief Narrative:  87 year old male with history of prostate cancer status post XRT, dementia, history of TAVR, hyperlipidemia comes in with a fall.  Wife reports that he has been weaker, started to have severe shaking, felt like he needed to get up and walk but had no energy to do so.  Family called EMS, he was febrile and was brought to the hospital.  He was febrile in the ED to 102, has been having intermittent constipation, and a CT scan of the abdomen pelvis showed proctitis as a potential source.  He was placed on antibiotics and admitted to the hospital.  Initially patient started on cefepime and Flagyl.  Developed hospital delirium therefore started on Seroquel.  PT recommended SNF.  Palliative care team following the patient.   Assessment & Plan:   Principal Problem:   Sepsis due to undetermined organism [facility]) Active Problems:   Hypercholesterolemia   Prostate cancer ([place])   Chronic systolic CHF (congestive heart failure) (HCC)   History of transcatheter aortic valve replacement (TAVR)   Elevated troponin   Dementia without behavioral disturbance ([place])   Malnutrition ([place])   DNR (do not resuscitate)   Sepsis ([place])   Pressure injury of skin   Protein-calorie malnutrition, severe    Sepsis, presumed due to proctitis - -Sepsis physiology improving.  CT abdomen pelvis consistent with proctitis. - Narrow antibiotics-p.o. Augmentin last day 7/29 - Gentle hydration   In hospital delirium, acute metabolic encephalopathy with underlying dementia - Seroquel 25 mg bedtime.  Will adjust as necessary.  Delirium precautions.  Mittens in place as he is trying to remove IV lines   A. fib with RVR- -His rate has improved.  Continue metoprolol.  Add Lopressor IV as needed - Not a candidate for anticoagulation due to dementia and falls   Chronic systolic CHF  -seen by  cardiology as an outpatient, most recently echo showed EF 30-35%, unchanged.  He is on no meds due to chronic hypotension but does not seem to have this issue here.  Tolerated fluid bolus in the ED well, hold additional fluids given that he is normotensive   Aortic valve stenosis -status post TAVR   Elevated troponin -demand ischemia.  Remains chest pain-free.   Malnutrition, goals of care -Previous provider discussed with the daughter at bedside that he has been declining significantly over the last year.  He has lost a lot of weight, is less mobile and he is not eating as well.  Discussed at length that this may be due to the trajectory of his dementia and at this point it can be considered a terminal illness.  If he recovers following this hospital stay may be very appropriate for home with hospice to which daughter is agreeable, however today she tells me that he is too weak for home now and she would like to pursue SNF. TOC consulted, awaiting placement  Severe protein calorie malnutrition Nutritional status  Nutrition Problem: Severe Malnutrition Etiology: chronic illness (dementia)  Signs/Symptoms: severe fat depletion, severe muscle depletion  Interventions: Ensure Enlive (each supplement provides 350kcal and 20 grams of protein), Magic cup, MVI  Body mass index is 20.36 kg/m.  DVT prophylaxis: enoxaparin (LOVENOX) injection 40 mg Start: 03/19/21 1800 Code Status: DNR Family Communication:    Status is: Inpatient  Remains inpatient appropriate because:Unsafe d/c plan  Dispo: The patient is from: Home  Anticipated d/c is to: SNF              Patient currently is medically stable to d/c.  Awaiting placement.   Difficult to place patient No        Pressure Injury 03/20/21 Sacrum Mid Stage 1 -  Intact skin with non-blanchable redness of a localized area usually over a bony prominence. reddened blanchable area sacral (Active)  03/20/21 1730  Location:  Sacrum  Location Orientation: Mid  Staging: Stage 1 -  Intact skin with non-blanchable redness of a localized area usually over a bony prominence.  Wound Description (Comments): reddened blanchable area sacral  Present on Admission: Yes      Subjective: Tolerated Seroquel well overnight he was able to rest.  No complaints this morning.  Still slightly delirious requiring mittens.   Examination:  Constitutional: Not in acute distress.  Elderly frail.  Bilateral upper extremity mittens Respiratory: Clear to auscultation bilaterally Cardiovascular: Normal sinus rhythm, no rubs Abdomen: Nontender nondistended good bowel sounds Musculoskeletal: No edema noted Skin: No rashes seen Neurologic: CN 2-12 grossly intact.  And nonfocal Psychiatric: Poor judgment and insight.  Alert to name only.   Objective: Vitals:   03/22/21 0946 03/22/21 1842 03/22/21 2103 03/23/21 0507  BP: 120/82 113/80 126/85 116/79  Pulse: (!) 133 (!) 133 (!) 131 (!) 129  Resp: '17 18 19 19  '$ Temp: 98.6 F (37 C)  97.6 F (36.4 C) 98.4 F (36.9 C)  TempSrc: Oral   Oral  SpO2:    94%  Weight:      Height:        Intake/Output Summary (Last 24 hours) at 03/23/2021 0908 Last data filed at 03/22/2021 1852 Gross per 24 hour  Intake 402.6 ml  Output 600 ml  Net -197.4 ml   Filed Weights   03/19/21 0509  Weight: 70 kg     Data Reviewed:   CBC: Recent Labs  Lab 03/19/21 0516 03/20/21 0500 03/21/21 0231 03/22/21 0054  WBC 11.0* 11.4* 9.1 9.0  NEUTROABS 10.3*  --   --   --   HGB 11.1* 10.4* 11.1* 11.4*  HCT 35.3* 31.8* 34.3* 34.5*  MCV 92.7 90.9 88.4 87.6  PLT 154 127* 153 0000000   Basic Metabolic Panel: Recent Labs  Lab 03/19/21 0516 03/20/21 0500 03/21/21 0231 03/22/21 0054 03/23/21 0243  NA 138 138 136 137 135  K 3.8 3.7 3.8 3.5 4.0  CL 109 113* 110 111 109  CO2 21* 18* 17* 17* 18*  GLUCOSE 91 83 96 95 123*  BUN 27* '21 22 20 19  '$ CREATININE 1.14 0.97 1.20 1.06 0.95  CALCIUM 8.8* 8.1*  8.5* 8.3* 8.3*  MG  --   --   --   --  1.7   GFR: Estimated Creatinine Clearance: 50.1 mL/min (by C-G formula based on SCr of 0.95 mg/dL). Liver Function Tests: Recent Labs  Lab 03/19/21 0516  AST 28  ALT 16  ALKPHOS 92  BILITOT 1.3*  PROT 5.7*  ALBUMIN 3.7   No results for input(s): LIPASE, AMYLASE in the last 168 hours. No results for input(s): AMMONIA in the last 168 hours. Coagulation Profile: Recent Labs  Lab 03/19/21 0516  INR 1.3*   Cardiac Enzymes: No results for input(s): CKTOTAL, CKMB, CKMBINDEX, TROPONINI in the last 168 hours. BNP (last 3 results) No results for input(s): PROBNP in the last 8760 hours. HbA1C: No results for input(s): HGBA1C in the last 72 hours. CBG: No results for input(s): GLUCAP  in the last 168 hours. Lipid Profile: No results for input(s): CHOL, HDL, LDLCALC, TRIG, CHOLHDL, LDLDIRECT in the last 72 hours. Thyroid Function Tests: No results for input(s): TSH, T4TOTAL, FREET4, T3FREE, THYROIDAB in the last 72 hours. Anemia Panel: No results for input(s): VITAMINB12, FOLATE, FERRITIN, TIBC, IRON, RETICCTPCT in the last 72 hours. Sepsis Labs: Recent Labs  Lab 03/19/21 0516 03/19/21 0815 03/19/21 1245  PROCALCITON  --   --  3.25  LATICACIDVEN 2.0* 1.2  --     Recent Results (from the past 240 hour(s))  Resp Panel by RT-PCR (Flu A&B, Covid) Nasopharyngeal Swab     Status: None   Collection Time: 03/19/21  5:16 AM   Specimen: Nasopharyngeal Swab; Nasopharyngeal(NP) swabs in vial transport medium  Result Value Ref Range Status   SARS Coronavirus 2 by RT PCR NEGATIVE NEGATIVE Final    Comment: (NOTE) SARS-CoV-2 target nucleic acids are NOT DETECTED.  The SARS-CoV-2 RNA is generally detectable in upper respiratory specimens during the acute phase of infection. The lowest concentration of SARS-CoV-2 viral copies this assay can detect is 138 copies/mL. A negative result does not preclude SARS-Cov-2 infection and should not be used as  the sole basis for treatment or other patient management decisions. A negative result may occur with  improper specimen collection/handling, submission of specimen other than nasopharyngeal swab, presence of viral mutation(s) within the areas targeted by this assay, and inadequate number of viral copies(<138 copies/mL). A negative result must be combined with clinical observations, patient history, and epidemiological information. The expected result is Negative.  Fact Sheet for Patients:  EntrepreneurPulse.com.au  Fact Sheet for Healthcare Providers:  IncredibleEmployment.be  This test is no t yet approved or cleared by the [place] FDA and  has been authorized for detection and/or diagnosis of SARS-CoV-2 by FDA under an Emergency Use Authorization (EUA). This EUA will remain  in effect (meaning this test can be used) for the duration of the COVID-19 declaration under Section 564(b)(1) of the Act, 21 U.S.C.section 360bbb-3(b)(1), unless the authorization is terminated  or revoked sooner.       Influenza A by PCR NEGATIVE NEGATIVE Final   Influenza B by PCR NEGATIVE NEGATIVE Final    Comment: (NOTE) The Xpert Xpress SARS-CoV-2/FLU/RSV plus assay is intended as an aid in the diagnosis of influenza from Nasopharyngeal swab specimens and should not be used as a sole basis for treatment. Nasal washings and aspirates are unacceptable for Xpert Xpress SARS-CoV-2/FLU/RSV testing.  Fact Sheet for Patients: EntrepreneurPulse.com.au  Fact Sheet for Healthcare Providers: IncredibleEmployment.be  This test is not yet approved or cleared by the [place] FDA and has been authorized for detection and/or diagnosis of SARS-CoV-2 by FDA under an Emergency Use Authorization (EUA). This EUA will remain in effect (meaning this test can be used) for the duration of the COVID-19 declaration under Section 564(b)(1) of  the Act, 21 U.S.C. section 360bbb-3(b)(1), unless the authorization is terminated or revoked.  Performed at [facility], [place] [address], [place], [place] [address]   Urine Culture     Status: Abnormal   Collection Time: 03/19/21  5:20 AM   Specimen: In/Out Cath Urine  Result Value Ref Range Status   Specimen Description IN/OUT CATH URINE  Final   Special Requests   Final    NONE Performed at [facility], [place] [address] [place], [place] [address]    Culture (A)  Final    10,000 COLONIES/mL MULTIPLE SPECIES PRESENT, SUGGEST RECOLLECTION  Report Status 03/20/2021 FINAL  Final  Blood Culture (routine x 2)     Status: None (Preliminary result)   Collection Time: 03/19/21  5:28 AM   Specimen: BLOOD  Result Value Ref Range Status   Specimen Description BLOOD RIGHT ANTECUBITAL  Final   Special Requests   Final    BOTTLES DRAWN AEROBIC AND ANAEROBIC Blood Culture adequate volume   Culture   Final    NO GROWTH 4 DAYS Performed at [facility], [place] [address], [place], [place] [address]    Report Status PENDING  Incomplete  Blood Culture (routine x 2)     Status: None (Preliminary result)   Collection Time: 03/19/21  5:37 AM   Specimen: BLOOD RIGHT HAND  Result Value Ref Range Status   Specimen Description BLOOD RIGHT HAND  Final   Special Requests   Final    BOTTLES DRAWN AEROBIC AND ANAEROBIC Blood Culture results may not be optimal due to an inadequate volume of blood received in culture bottles   Culture   Final    NO GROWTH 4 DAYS Performed at [facility], [place] [address] [place], [place] [address]    Report Status PENDING  Incomplete         Radiology Studies: No results found.      Scheduled Meds:  amoxicillin-clavulanate  1 tablet Oral Q12H   aspirin EC  81 mg Oral Daily   docusate sodium  100 mg Oral BID   enoxaparin (LOVENOX) injection  40 mg Subcutaneous Q24H   feeding supplement  237 mL Oral TID BM   metoprolol tartrate  25  mg Oral BID   metroNIDAZOLE  500 mg Oral Q8H   multivitamin with minerals  1 tablet Oral Daily   QUEtiapine  25 mg Oral QHS   sodium chloride flush  3 mL Intravenous Q12H   tamsulosin  0.4 mg Oral Daily   Continuous Infusions:  lactated ringers 75 mL/hr at 03/22/21 1031   magnesium sulfate bolus IVPB       LOS: 3 days   Time spent= 35 mins    Riker Collier Arsenio Loader, MD [facility]  If 7PM-7AM, please contact night-coverage  03/23/2021, 9:08 AM

## 2021-03-23 NOTE — Progress Notes (Signed)
Daily Progress Note   Patient Name: Jonathan Graham       Date: 03/23/2021 DOB: [birth year]  Age: 87 y.o. MRN#: [id number] Attending Physician: Damita Lack, MD Primary Care Physician: [name], MD Admit Date: 03/19/2021  Reason for Consultation/Follow-up:  To discuss complex medical decision making related to patient's goals of care  Subjective: Family meeting held with wife, son, daughter, grand daughter and son in law present.   PMH and acute hospitalization reviewed with family.  They understand his heart is weaker and he is very malnourished.  The proctitis has potential to return.  His dementia dictates that he does best with a routine and a familiar environment.   Wife feels his mental status is improving but he is not yet back to baseline.  Discussed the option of taking him home with hospice.   They had Hospice for 6 months in the past and the patient loved them.  Family all agrees patient would prefer to go home and that he would likely do better at home than at [name].     We discussed that Hospice does not provide hours of care during the day - rather they come in, do what needs to be done, and then they leave.  Consequently the majority of the 24 hour day care giving is being provided by the family.   Family discusses using privately hired care givers to [name] (wife).   [name] (dtr) expresses a concern that [name] would want to care for [name] herself and not want privately hired care givers in the home.  I empathized - understanding that it is a invasion of sacred space when multiple shifts of privately hired care givers are in your private space.  [name] expresses that "If he could just re-gain a little strength I could manage him on my own".   Consequently today she is in favor of him going to short term rehab.  [name] feeling that she would be able to manage him herself at home is concerning to the family.  The concern that [name] may decline at SNF was raised by multiple family members.  I suggested that given his delirium in the hospital it would not be unexpected for him to have delirium at SNF.  Due to his limited ability to follow commands I'm  uncertain how much he would benefit from SNF.  Family discussing best option for the patient and family as a whole.        Assessment: Patient improved but not quite back to his mental baseline.  On Augmentin for proctitis.   Patient Profile/HPI:  87 y.o. male  with past medical history of  AS s/p TAVR, CHF EF 30-35%, prostate cancer, dementia, and malnutrition who was admitted on 03/19/2021 with sepsis likely due to proctitis.  Labs reveals a pro calcitonin of  3.25, troponin peak of 586, and CT showed rectal wall thickening c/w proctitis.  His hospital course was complicated by delirium.   Length of Stay: 3   Vital Signs: BP 117/74 (BP Location: Right Arm)   Pulse 80   Temp 99.9 F (37.7 C) (Oral)   Resp 18   Ht '6\' 1"'$  (1.854 m)   Wt 70 kg   SpO2 94%   BMI 20.36 kg/m  SpO2: SpO2: 94 % O2 Device: O2 Device: Room Air O2 Flow Rate:         Palliative Assessment/Data:  20-30%     Palliative Care Plan    Recommendations/Plan: I expect family to sort thru the choice of home with Hospice versus SNF with Palliative in the next day or two. Would continue current treatment. Would continue down the path of SNF placement for now until family is able to make a decision.  Code Status:  DNR  Prognosis:  < 6 months due to CHF, heart disease, dementia, sepsis, weight loss and malnutrition.  Discharge Planning: To Be Determined  SNF with Palliative vs Home with Hospice.  Care plan was discussed with Family and [facility] MD.  Thank you for allowing the Palliative Medicine Team  to assist in the care of this patient.  Total time spent:  65 min. Time in:  10:00 Time out:  11:05     Greater than 50%  of this time was spent counseling and coordinating care related to the above assessment and plan.  Florentina Jenny, PA-C Palliative Medicine  Please contact Palliative MedicineTeam phone at [phone] for questions and concerns between 7 am - 7 pm.   Please see AMION for individual provider pager numbers.

## 2021-03-24 ENCOUNTER — Telehealth: Payer: Self-pay | Admitting: Family Medicine

## 2021-03-24 DIAGNOSIS — R410 Disorientation, unspecified: Secondary | ICD-10-CM | POA: Diagnosis not present

## 2021-03-24 DIAGNOSIS — Z7189 Other specified counseling: Secondary | ICD-10-CM | POA: Diagnosis not present

## 2021-03-24 DIAGNOSIS — Z515 Encounter for palliative care: Secondary | ICD-10-CM

## 2021-03-24 DIAGNOSIS — G301 Alzheimer's disease with late onset: Secondary | ICD-10-CM | POA: Diagnosis not present

## 2021-03-24 DIAGNOSIS — A419 Sepsis, unspecified organism: Secondary | ICD-10-CM | POA: Diagnosis not present

## 2021-03-24 LAB — CULTURE, BLOOD (ROUTINE X 2)
Culture: NO GROWTH
Culture: NO GROWTH
Special Requests: ADEQUATE

## 2021-03-24 MED ORDER — OLANZAPINE 5 MG PO TABS
5.0000 mg | ORAL_TABLET | Freq: Every day | ORAL | Status: DC
  Administered 2021-03-24: 5 mg via ORAL
  Filled 2021-03-24: qty 1

## 2021-03-24 NOTE — Telephone Encounter (Signed)
Alwyn Ren from [facility] call and want a call back at [phone] to talk about pt.

## 2021-03-24 NOTE — Progress Notes (Signed)
Palliative Medicine RN Note: Rec'd a call at the PMT office from daughter Maximino Sarin. A liaison from [facility] needs to do an in-person eval tomorrow with the pt and family. I notified the RN, RNCM, and SW via secure chat.  She also wanted to be sure her brother can come tonight, and I noted the MD order for visitation privileges r/t the pt's delirium.  Marjie Skiff Myya Meenach, RN, BSN, [facility] Palliative Medicine Team 03/24/2021 1:45 PM Office [phone]

## 2021-03-24 NOTE — Progress Notes (Signed)
Physical Therapy Treatment Patient Details Name: Jonathan Graham MRN: [id number] DOB: Sep 06, 1927 Today's Date: 03/24/2021    History of Present Illness 87 yo male presenting to ED on 03/19/2021 after a fall at home. PMH significant for dementia, arthritis, prostate cancer, heart murmur, heart cath, and back surgery.    PT Comments    Pt supine, wide awake and participatory this morning. Pt request to talk to his wife and agreeable to sit on EoB to call her. Pt requires modA to come to EoB. Wife called and pt able to talk with her and maintain seated balance for approximately 1.5 minutes and then requires occasional assist to return from increased posterior lean. Pt agreeable to attempt to come to standing and requires total A and is unable to achieve fully upright. Pt requires maxA for returning to bed. D/c plan remains appropriate at this time. PT will continue to follow acutely.    Follow Up Recommendations  SNF;Supervision/Assistance - 24 hour (Memory Care)     Equipment Recommendations  Other (comment) (TBD)       Precautions / Restrictions Precautions Precautions: None Precaution Comments: delirium precautions Restrictions Weight Bearing Restrictions: No    Mobility  Bed Mobility Overal bed mobility: Needs Assistance Bed Mobility: Supine to Sit;Sit to Supine     Supine to sit: Mod assist;HOB elevated Sit to supine: Max assist   General bed mobility comments: modA and increased cuing for sequencing for LE management across bed and for bringing trunk to upright, pt able to bring L LE back into bed without assist but requires assist to bring R LE into bed and to bring trunk down and square in bed    Transfers Overall transfer level: Needs assistance Equipment used: 1 person hand held assist Transfers: Sit to/from Stand Sit to Stand: Total assist         General transfer comment: 2x attempts to come to standing, pt able to clear hips from bed, however unable to  bring trunk upright and rotate pelvis to achieve upright, not clear whether it is due to fear or weakness        Balance Overall balance assessment: Needs assistance Sitting-balance support: No upper extremity supported;Feet supported;Bilateral upper extremity supported;Single extremity supported Sitting balance-Leahy Scale: Poor Sitting balance - Comments: able to static sit to talk to wife on phone ~1.5 minutes                                    Cognition Arousal/Alertness: Awake/alert;Lethargic Behavior During Therapy: WFL for tasks assessed/performed Overall Cognitive Status: No family/caregiver present to determine baseline cognitive functioning                                 General Comments: Baseline dementia. Unsure of cognitive baseline. Disoriented to time, and situation, knows he is in [place] comments (skin integrity, edema, etc.): VSS on RA, much more participatory today      Pertinent Vitals/Pain Pain Assessment: Faces Faces Pain Scale: Hurts a little bit Pain Location: R knee Pain Descriptors / Indicators: Discomfort Pain Intervention(s): Limited activity within patient's tolerance;Monitored during session;Repositioned     PT Goals (current goals can now be found in the care plan section) Acute Rehab PT Goals Patient Stated Goal: unabel to state PT Goal Formulation:  Patient unable to participate in goal setting Time For Goal Achievement: 04/04/21 Potential to Achieve Goals: Poor Progress towards PT goals: Progressing toward goals    Frequency    Min 3X/week      PT Plan Current plan remains appropriate       AM-PAC PT "6 Clicks" Mobility   Outcome Measure  Help needed turning from your back to your side while in a flat bed without using bedrails?: Total Help needed moving from lying on your back to sitting on the side of a flat bed without using bedrails?:  Total Help needed moving to and from a bed to a chair (including a wheelchair)?: Total Help needed standing up from a chair using your arms (e.g., wheelchair or bedside chair)?: Total Help needed to walk in hospital room?: Total Help needed climbing 3-5 steps with a railing? : Total 6 Click Score: 6    End of Session Equipment Utilized During Treatment: Gait belt Activity Tolerance: Other (comment);Patient limited by fatigue (limited by cognition) Patient left: in bed;with call bell/phone within reach;with nursing/sitter in room;with restraints reapplied;with bed alarm set Nurse Communication: Mobility status PT Visit Diagnosis: Unsteadiness on feet (R26.81);Muscle weakness (generalized) (M62.81);Other abnormalities of gait and mobility (R26.89);History of falling (Z91.81);Repeated falls (R29.6);Difficulty in walking, not elsewhere classified (R26.2)     Time: [id number] PT Time Calculation (min) (ACUTE ONLY): 21 min  Charges:  $Therapeutic Activity: 8-22 mins                     Cadon Raczka B. Migdalia Dk PT, DPT Acute Rehabilitation Services Pager [phone] Office [phone]    [place] 03/24/2021, 10:03 AM

## 2021-03-24 NOTE — Progress Notes (Signed)
Daily Progress Note   Patient Name: Jonathan Graham       Date: 03/24/2021 DOB: [birth year]  Age: 87 y.o. MRN#: [id number] Attending Physician: Damita Lack, MD Primary Care Physician: [name], MD Admit Date: 03/19/2021  Reason for Consultation/Follow-up: Non pain symptom management   To discuss complex medical decision making related to patient's goals of care  Subjective: Patient with delirium overnight.  Still very confused but pleasant this am.  Wife and daughter are on their way into the hospital.  Patient tells me he is in the closet right now. Talked with family about SNF vs Home with Hospice and 24 hour care.  Wife and family choose home with hospice and 24 hour care.   Wife had an episode of low blood sugar this am.  [name] (daughter) is trying to care for her 71 yo mother and make 24 hour care arrangements for her father.   She will need 24 - 48 hours to get these in place.    [facility] will need time to get bed in the home.  Discussed trial of olanzapine rather than seroquel this evening to see if it works better for his sleeping/anorexia/delirium.    Family warned that he did not do well with a dose of something he received in the ED.   I believe this may have been lorazepam.     Assessment: Pleasantly confused.  Agitated overnight.   Patient Profile/HPI:  87 y.o. male  with past medical history of  AS s/p TAVR, CHF EF 30-35%, prostate cancer, dementia, and malnutrition who was admitted on 03/19/2021 with sepsis likely due to proctitis.  Labs reveals a pro calcitonin of  3.25, troponin peak of 586, and CT showed rectal wall thickening c/w proctitis.  His hospital course was complicated by delirium.      Length of Stay: 4   Vital Signs: BP 116/73   Pulse 90   Temp  97.7 F (36.5 C) (Oral)   Resp 20   Ht '6\' 1"'$  (1.854 m)   Wt 65.5 kg   SpO2 96%   BMI 19.05 kg/m  SpO2: SpO2: 96 % O2 Device: O2 Device: Room Air O2 Flow Rate:         Palliative Assessment/Data: 30%     Palliative Care Plan    Recommendations/Plan:  Home with [place] once 24 hour private care is arranged and equipment has been delivered.   Please call daughter [name] regarding logistics rather than wife [name]. Will DC seroquel and initiate Olanzapine 5 mg QHS.  For insomnia, agitation, anorexia. Would avoid benzodiazepines - he had a paradoxical reaction to them this admit. Family to have unrestricted visitation due to his delirium and the need for them to stay 24 hours if possible.  Code Status:  DNR  Prognosis:  < 6 months   Discharge Planning: Home with [place] was discussed with Family and RN.  [facility] medical team  Thank you for allowing the Palliative Medicine Team to assist in the care of this patient.  Total time spent:  35 min     Greater than 50%  of this time was spent counseling and coordinating care related to the above assessment and plan.  Florentina Jenny, PA-C Palliative Medicine  Please contact Palliative MedicineTeam phone at [phone] for questions and concerns between 7 am - 7 pm.   Please see AMION for individual provider pager numbers.

## 2021-03-24 NOTE — TOC Progression Note (Signed)
Transition of Care [facility]) - Progression Note    Patient Details  Name: Jonathan Graham MRN: [id number] Date of Birth: Dec 07, 1927  Transition of [facility]) CM/SW Contact  Loletha Grayer Beverely Pace, RN Phone Number: 03/24/2021, 11:25 AM  Clinical Narrative:  Case manager contacted Edwin Cap , [place] Liaison an asked that she contact patient's daughter [name].      Expected Discharge Plan: [place] Barriers to Discharge: Continued Medical Work up  Expected Discharge Plan and Services Expected Discharge Plan: [place] arrangements for the past 2 months: Single Family Home                                       Social Determinants of Health (SDOH) Interventions    Readmission Risk Interventions No flowsheet data found.

## 2021-03-24 NOTE — Progress Notes (Signed)
PROGRESS NOTE    Jonathan Graham  [id number] DOB: 09-12-1927 DOA: 03/19/2021 PCP: [name], MD   Brief Narrative:  87 year old male with history of prostate cancer status post XRT, dementia, history of TAVR, hyperlipidemia comes in with a fall.  Wife reports that he has been weaker, started to have severe shaking, felt like he needed to get up and walk but had no energy to do so.  Family called EMS, he was febrile and was brought to the hospital.  He was febrile in the ED to 102, has been having intermittent constipation, and a CT scan of the abdomen pelvis showed proctitis as a potential source.  He was placed on antibiotics and admitted to the hospital.  Initially patient started on cefepime and Flagyl.  Developed hospital delirium therefore started on Seroquel.  PT recommended SNF.  Palliative care team following the patient. Family decided for home with Hospice.   Assessment & Plan:   Principal Problem:   Sepsis due to undetermined organism [facility]) Active Problems:   Hypercholesterolemia   Prostate cancer ([place])   Chronic systolic CHF (congestive heart failure) (HCC)   History of transcatheter aortic valve replacement (TAVR)   Elevated troponin   Dementia without behavioral disturbance ([place])   Malnutrition ([place])   DNR (do not resuscitate)   Sepsis ([place])   Pressure injury of skin   Protein-calorie malnutrition, severe   Delirium   Encounter for hospice care discussion   Palliative care encounter    Sepsis, presumed due to proctitis - -Sepsis physiology improving.  CT abdomen pelvis consistent with proctitis. - Narrow antibiotics-p.o. Augmentin last day 7/29 - Gentle hydration   In hospital delirium, acute metabolic encephalopathy with underlying dementia - Now on Olanzapine '5mg'$  po qhs.    A. fib with RVR- -Rate controlled Lopressor prn   Chronic systolic CHF  -seen by cardiology as an outpatient, most recently echo showed EF 30-35%, unchanged.  He is on no  meds due to chronic hypotension but does not seem to have this issue here.  Tolerated fluid bolus in the ED well, hold additional fluids given that he is normotensive   Aortic valve stenosis -status post TAVR   Elevated troponin -demand ischemia.  Remains chest pain-free.   Malnutrition, goals of care -transition to home with hospice.   Severe protein calorie malnutrition Nutritional status  Nutrition Problem: Severe Malnutrition Etiology: chronic illness (dementia)  Signs/Symptoms: severe fat depletion, severe muscle depletion  Interventions: Ensure Enlive (each supplement provides 350kcal and 20 grams of protein), Magic cup, MVI  Body mass index is 20.36 kg/m.  DVT prophylaxis: enoxaparin (LOVENOX) injection 40 mg Start: 03/19/21 1800 Code Status: DNR Family Communication:  daughter Updated.   Status is: Inpatient  Remains inpatient appropriate because:Unsafe d/c plan  Dispo: The patient is from: Home              Anticipated d/c is to: SNF              Patient currently is medically stable to d/c.  Awaiting placement.   Difficult to place patient No        Pressure Injury 03/20/21 Sacrum Mid Stage 1 -  Intact skin with non-blanchable redness of a localized area usually over a bony prominence. reddened blanchable area sacral (Active)  03/20/21 1730  Location: Sacrum  Location Orientation: Mid  Staging: Stage 1 -  Intact skin with non-blanchable redness of a localized area usually over a bony prominence.  Wound Description (Comments): reddened  blanchable area sacral  Present on Admission: Yes      Subjective: Doing ok no complaints.   Examination:  Constitutional: Not in acute distress; elderly frail.  Respiratory: Clear to auscultation bilaterally Cardiovascular: Normal sinus rhythm, no rubs Abdomen: Nontender nondistended good bowel sounds Musculoskeletal: No edema noted Skin: No rashes seen Neurologic: CN 2-12 grossly intact.  And  nonfocal Psychiatric: Poorjudgment and insight. Alert and oriented x name.    Objective: Vitals:   03/23/21 1709 03/23/21 2037 03/24/21 0445 03/24/21 1010  BP: 105/69 104/65 112/72 116/73  Pulse: 61 60 88 90  Resp: '18 18 18 20  '$ Temp: 97.8 F (36.6 C) 97.9 F (36.6 C) 97.8 F (36.6 C) 97.7 F (36.5 C)  TempSrc: Oral Oral Axillary Oral  SpO2: 96% 95% 95% 96%  Weight:   65.5 kg   Height:        Intake/Output Summary (Last 24 hours) at 03/24/2021 1236 Last data filed at 03/24/2021 0820 Gross per 24 hour  Intake 220 ml  Output --  Net 220 ml   Filed Weights   03/19/21 0509 03/24/21 0445  Weight: 70 kg 65.5 kg     Data Reviewed:   CBC: Recent Labs  Lab 03/19/21 0516 03/20/21 0500 03/21/21 0231 03/22/21 0054  WBC 11.0* 11.4* 9.1 9.0  NEUTROABS 10.3*  --   --   --   HGB 11.1* 10.4* 11.1* 11.4*  HCT 35.3* 31.8* 34.3* 34.5*  MCV 92.7 90.9 88.4 87.6  PLT 154 127* 153 0000000   Basic Metabolic Panel: Recent Labs  Lab 03/19/21 0516 03/20/21 0500 03/21/21 0231 03/22/21 0054 03/23/21 0243  NA 138 138 136 137 135  K 3.8 3.7 3.8 3.5 4.0  CL 109 113* 110 111 109  CO2 21* 18* 17* 17* 18*  GLUCOSE 91 83 96 95 123*  BUN 27* '21 22 20 19  '$ CREATININE 1.14 0.97 1.20 1.06 0.95  CALCIUM 8.8* 8.1* 8.5* 8.3* 8.3*  MG  --   --   --   --  1.7   GFR: Estimated Creatinine Clearance: 46.9 mL/min (by C-G formula based on SCr of 0.95 mg/dL). Liver Function Tests: Recent Labs  Lab 03/19/21 0516  AST 28  ALT 16  ALKPHOS 92  BILITOT 1.3*  PROT 5.7*  ALBUMIN 3.7   No results for input(s): LIPASE, AMYLASE in the last 168 hours. No results for input(s): AMMONIA in the last 168 hours. Coagulation Profile: Recent Labs  Lab 03/19/21 0516  INR 1.3*   Cardiac Enzymes: No results for input(s): CKTOTAL, CKMB, CKMBINDEX, TROPONINI in the last 168 hours. BNP (last 3 results) No results for input(s): PROBNP in the last 8760 hours. HbA1C: No results for input(s): HGBA1C in the last  72 hours. CBG: No results for input(s): GLUCAP in the last 168 hours. Lipid Profile: No results for input(s): CHOL, HDL, LDLCALC, TRIG, CHOLHDL, LDLDIRECT in the last 72 hours. Thyroid Function Tests: No results for input(s): TSH, T4TOTAL, FREET4, T3FREE, THYROIDAB in the last 72 hours. Anemia Panel: No results for input(s): VITAMINB12, FOLATE, FERRITIN, TIBC, IRON, RETICCTPCT in the last 72 hours. Sepsis Labs: Recent Labs  Lab 03/19/21 0516 03/19/21 0815 03/19/21 1245  PROCALCITON  --   --  3.25  LATICACIDVEN 2.0* 1.2  --     Recent Results (from the past 240 hour(s))  Resp Panel by RT-PCR (Flu A&B, Covid) Nasopharyngeal Swab     Status: None   Collection Time: 03/19/21  5:16 AM   Specimen: Nasopharyngeal Swab;  Nasopharyngeal(NP) swabs in vial transport medium  Result Value Ref Range Status   SARS Coronavirus 2 by RT PCR NEGATIVE NEGATIVE Final    Comment: (NOTE) SARS-CoV-2 target nucleic acids are NOT DETECTED.  The SARS-CoV-2 RNA is generally detectable in upper respiratory specimens during the acute phase of infection. The lowest concentration of SARS-CoV-2 viral copies this assay can detect is 138 copies/mL. A negative result does not preclude SARS-Cov-2 infection and should not be used as the sole basis for treatment or other patient management decisions. A negative result may occur with  improper specimen collection/handling, submission of specimen other than nasopharyngeal swab, presence of viral mutation(s) within the areas targeted by this assay, and inadequate number of viral copies(<138 copies/mL). A negative result must be combined with clinical observations, patient history, and epidemiological information. The expected result is Negative.  Fact Sheet for Patients:  EntrepreneurPulse.com.au  Fact Sheet for Healthcare Providers:  IncredibleEmployment.be  This test is no t yet approved or cleared by the [place] FDA  and  has been authorized for detection and/or diagnosis of SARS-CoV-2 by FDA under an Emergency Use Authorization (EUA). This EUA will remain  in effect (meaning this test can be used) for the duration of the COVID-19 declaration under Section 564(b)(1) of the Act, 21 U.S.C.section 360bbb-3(b)(1), unless the authorization is terminated  or revoked sooner.       Influenza A by PCR NEGATIVE NEGATIVE Final   Influenza B by PCR NEGATIVE NEGATIVE Final    Comment: (NOTE) The Xpert Xpress SARS-CoV-2/FLU/RSV plus assay is intended as an aid in the diagnosis of influenza from Nasopharyngeal swab specimens and should not be used as a sole basis for treatment. Nasal washings and aspirates are unacceptable for Xpert Xpress SARS-CoV-2/FLU/RSV testing.  Fact Sheet for Patients: EntrepreneurPulse.com.au  Fact Sheet for Healthcare Providers: IncredibleEmployment.be  This test is not yet approved or cleared by the [place] FDA and has been authorized for detection and/or diagnosis of SARS-CoV-2 by FDA under an Emergency Use Authorization (EUA). This EUA will remain in effect (meaning this test can be used) for the duration of the COVID-19 declaration under Section 564(b)(1) of the Act, 21 U.S.C. section 360bbb-3(b)(1), unless the authorization is terminated or revoked.  Performed at [facility], [place] [address], [place], [place] [address]   Urine Culture     Status: Abnormal   Collection Time: 03/19/21  5:20 AM   Specimen: In/Out Cath Urine  Result Value Ref Range Status   Specimen Description IN/OUT CATH URINE  Final   Special Requests   Final    NONE Performed at [facility], [place] [address] [place], [place] [address]    Culture (A)  Final    10,000 COLONIES/mL MULTIPLE SPECIES PRESENT, SUGGEST RECOLLECTION   Report Status 03/20/2021 FINAL  Final  Blood Culture (routine x 2)     Status: None   Collection Time: 03/19/21  5:28 AM    Specimen: BLOOD  Result Value Ref Range Status   Specimen Description BLOOD RIGHT ANTECUBITAL  Final   Special Requests   Final    BOTTLES DRAWN AEROBIC AND ANAEROBIC Blood Culture adequate volume   Culture   Final    NO GROWTH 5 DAYS Performed at [facility], [place] [address] [place], [place] [address]    Report Status 03/24/2021 FINAL  Final  Blood Culture (routine x 2)     Status: None   Collection Time: 03/19/21  5:37 AM  Specimen: BLOOD RIGHT HAND  Result Value Ref Range Status   Specimen Description BLOOD RIGHT HAND  Final   Special Requests   Final    BOTTLES DRAWN AEROBIC AND ANAEROBIC Blood Culture results may not be optimal due to an inadequate volume of blood received in culture bottles   Culture   Final    NO GROWTH 5 DAYS Performed at [facility], [place] [address] [place], [place] [address]    Report Status 03/24/2021 FINAL  Final         Radiology Studies: No results found.      Scheduled Meds:  amoxicillin-clavulanate  1 tablet Oral Q12H   aspirin EC  81 mg Oral Daily   docusate sodium  100 mg Oral BID   enoxaparin (LOVENOX) injection  40 mg Subcutaneous Q24H   feeding supplement  237 mL Oral TID BM   metoprolol tartrate  25 mg Oral BID   metroNIDAZOLE  500 mg Oral Q8H   multivitamin with minerals  1 tablet Oral Daily   OLANZapine  5 mg Oral QHS   sodium chloride flush  3 mL Intravenous Q12H   tamsulosin  0.4 mg Oral Daily   Continuous Infusions:     LOS: 4 days   Time spent= 35 mins    Graycen Sadlon Arsenio Loader, MD [facility]  If 7PM-7AM, please contact night-coverage  03/24/2021, 12:36 PM

## 2021-03-24 NOTE — Progress Notes (Signed)
[facility])  Referral received for hospice services at home once discharged.  Spoke with dtr [name], confirmed interest, provided support and answered questions.  She is in the process of finding 24/7 caregivers to assist in caring for her father.  Plan to dc once caregivers are lined up.  Pt will need ambulance transport home.  Currently, [name] is not sure if she wants a hospital bed. She will discuss with the caregivers to see if they feel he needs it. He has a large adjustable bed that he shares with his wife.  Thank you, Venia Carbon RN, BSN, [facility] Liaison

## 2021-03-25 DIAGNOSIS — A419 Sepsis, unspecified organism: Secondary | ICD-10-CM | POA: Diagnosis not present

## 2021-03-25 LAB — BASIC METABOLIC PANEL
Anion gap: 9 (ref 5–15)
BUN: 16 mg/dL (ref 8–23)
CO2: 23 mmol/L (ref 22–32)
Calcium: 8.4 mg/dL — ABNORMAL LOW (ref 8.9–10.3)
Chloride: 102 mmol/L (ref 98–111)
Creatinine, Ser: 0.8 mg/dL (ref 0.61–1.24)
GFR, Estimated: >60 mL/min (ref >60)
Glucose, Bld: 118 mg/dL — ABNORMAL HIGH (ref 70–99)
Potassium: 4.1 mmol/L (ref 3.5–5.1)
Sodium: 134 mmol/L — ABNORMAL LOW (ref 135–145)

## 2021-03-25 LAB — MAGNESIUM: Magnesium: 1.8 mg/dL (ref 1.7–2.4)

## 2021-03-25 MED ORDER — OLANZAPINE 5 MG PO TABS
10.0000 mg | ORAL_TABLET | Freq: Every day | ORAL | Status: DC
  Administered 2021-03-25: 10 mg via ORAL
  Filled 2021-03-25: qty 2

## 2021-03-25 NOTE — Progress Notes (Signed)
PROGRESS NOTE    Jonathan Graham  [id number] DOB: 1928-06-17 DOA: 03/19/2021 PCP: [name], MD   Brief Narrative:  87 year old male with history of prostate cancer status post XRT, dementia, history of TAVR, hyperlipidemia comes in with a fall.  Wife reports that he has been weaker, started to have severe shaking, felt like he needed to get up and walk but had no energy to do so.  Family called EMS, he was febrile and was brought to the hospital.  He was febrile in the ED to 102, has been having intermittent constipation, and a CT scan of the abdomen pelvis showed proctitis as a potential source.  He was placed on antibiotics and admitted to the hospital.  Initially patient started on cefepime and Flagyl.  Developed hospital delirium therefore started on Seroquel.  PT recommended SNF.  Palliative care team following the patient. Family decided for home with Hospice.   Assessment & Plan:   Principal Problem:   Sepsis due to undetermined organism [facility]) Active Problems:   Hypercholesterolemia   Prostate cancer ([place])   Chronic systolic CHF (congestive heart failure) (HCC)   History of transcatheter aortic valve replacement (TAVR)   Elevated troponin   Dementia without behavioral disturbance ([place])   Malnutrition ([place])   DNR (do not resuscitate)   Sepsis ([place])   Pressure injury of skin   Protein-calorie malnutrition, severe   Delirium   Encounter for hospice care discussion   Palliative care encounter    Sepsis, presumed due to proctitis - -Sepsis physiology improving.  CT abdomen pelvis consistent with proctitis. - Narrow antibiotics-p.o. Augmentin last day 7/29   In hospital delirium, acute metabolic encephalopathy with underlying dementia - Olanzapine 5 mg p.o. nightly   A. fib with RVR- -Rate controlled Lopressor prn   Chronic systolic CHF  -seen by cardiology as an outpatient, most recently echo showed EF 30-35%, unchanged.  He is on no meds due to chronic  hypotension but does not seem to have this issue here.  Tolerated fluid bolus in the ED well, hold additional fluids given that he is normotensive   Aortic valve stenosis -status post TAVR   Elevated troponin -demand ischemia.  Remains chest pain-free.   Malnutrition, goals of care -transition to home with hospice.   Severe protein calorie malnutrition Nutritional status  Nutrition Problem: Severe Malnutrition Etiology: chronic illness (dementia)  Signs/Symptoms: severe fat depletion, severe muscle depletion  Interventions: Ensure Enlive (each supplement provides 350kcal and 20 grams of protein), Magic cup, MVI  Body mass index is 20.36 kg/m.  DVT prophylaxis: enoxaparin (LOVENOX) injection 40 mg Start: 03/19/21 1800 Code Status: DNR Family Communication:  daughter Updated.   Status is: Inpatient  Remains inpatient appropriate because:Unsafe d/c plan  Dispo: The patient is from: Home              Anticipated d/c is to: SNF              Patient currently is medically stable to d/c.  Awaiting arrangements at home so equipment transition home with hospice   Difficult to place patient No        Pressure Injury 03/20/21 Sacrum Mid Stage 1 -  Intact skin with non-blanchable redness of a localized area usually over a bony prominence. reddened blanchable area sacral (Active)  03/20/21 1730  Location: Sacrum  Location Orientation: Mid  Staging: Stage 1 -  Intact skin with non-blanchable redness of a localized area usually over a bony prominence.  Wound  Description (Comments): reddened blanchable area sacral  Present on Admission: Yes      Subjective: Laying in the bed watching TV, no complaints  Examination: Constitutional: Not in acute distress, elderly frail Respiratory: Clear to auscultation bilaterally Cardiovascular: Normal sinus rhythm, no rubs Abdomen: Nontender nondistended good bowel sounds Musculoskeletal: No edema noted Skin: No rashes  seen Neurologic: CN 2-12 grossly intact.  And nonfocal Psychiatric: Poor judgment and insight  Objective: Vitals:   03/24/21 1801 03/24/21 2127 03/25/21 0500 03/25/21 0557  BP: 107/73 117/70  107/71  Pulse: 87 80  88  Resp: '16 18  18  '$ Temp: 97.8 F (36.6 C) 97.9 F (36.6 C)  98.4 F (36.9 C)  TempSrc: Oral Oral  Oral  SpO2: 93% 96%  97%  Weight:   62.9 kg   Height:        Intake/Output Summary (Last 24 hours) at 03/25/2021 0826 Last data filed at 03/24/2021 2203 Gross per 24 hour  Intake 420 ml  Output 350 ml  Net 70 ml   Filed Weights   03/19/21 0509 03/24/21 0445 03/25/21 0500  Weight: 70 kg 65.5 kg 62.9 kg     Data Reviewed:   CBC: Recent Labs  Lab 03/19/21 0516 03/20/21 0500 03/21/21 0231 03/22/21 0054  WBC 11.0* 11.4* 9.1 9.0  NEUTROABS 10.3*  --   --   --   HGB 11.1* 10.4* 11.1* 11.4*  HCT 35.3* 31.8* 34.3* 34.5*  MCV 92.7 90.9 88.4 87.6  PLT 154 127* 153 0000000   Basic Metabolic Panel: Recent Labs  Lab 03/20/21 0500 03/21/21 0231 03/22/21 0054 03/23/21 0243 03/25/21 0013  NA 138 136 137 135 134*  K 3.7 3.8 3.5 4.0 4.1  CL 113* 110 111 109 102  CO2 18* 17* 17* 18* 23  GLUCOSE 83 96 95 123* 118*  BUN '21 22 20 19 16  '$ CREATININE 0.97 1.20 1.06 0.95 0.80  CALCIUM 8.1* 8.5* 8.3* 8.3* 8.4*  MG  --   --   --  1.7 1.8   GFR: Estimated Creatinine Clearance: 53.5 mL/min (by C-G formula based on SCr of 0.8 mg/dL). Liver Function Tests: Recent Labs  Lab 03/19/21 0516  AST 28  ALT 16  ALKPHOS 92  BILITOT 1.3*  PROT 5.7*  ALBUMIN 3.7   No results for input(s): LIPASE, AMYLASE in the last 168 hours. No results for input(s): AMMONIA in the last 168 hours. Coagulation Profile: Recent Labs  Lab 03/19/21 0516  INR 1.3*   Cardiac Enzymes: No results for input(s): CKTOTAL, CKMB, CKMBINDEX, TROPONINI in the last 168 hours. BNP (last 3 results) No results for input(s): PROBNP in the last 8760 hours. HbA1C: No results for input(s): HGBA1C in the  last 72 hours. CBG: No results for input(s): GLUCAP in the last 168 hours. Lipid Profile: No results for input(s): CHOL, HDL, LDLCALC, TRIG, CHOLHDL, LDLDIRECT in the last 72 hours. Thyroid Function Tests: No results for input(s): TSH, T4TOTAL, FREET4, T3FREE, THYROIDAB in the last 72 hours. Anemia Panel: No results for input(s): VITAMINB12, FOLATE, FERRITIN, TIBC, IRON, RETICCTPCT in the last 72 hours. Sepsis Labs: Recent Labs  Lab 03/19/21 0516 03/19/21 0815 03/19/21 1245  PROCALCITON  --   --  3.25  LATICACIDVEN 2.0* 1.2  --     Recent Results (from the past 240 hour(s))  Resp Panel by RT-PCR (Flu A&B, Covid) Nasopharyngeal Swab     Status: None   Collection Time: 03/19/21  5:16 AM   Specimen: Nasopharyngeal Swab; Nasopharyngeal(NP) swabs  in vial transport medium  Result Value Ref Range Status   SARS Coronavirus 2 by RT PCR NEGATIVE NEGATIVE Final    Comment: (NOTE) SARS-CoV-2 target nucleic acids are NOT DETECTED.  The SARS-CoV-2 RNA is generally detectable in upper respiratory specimens during the acute phase of infection. The lowest concentration of SARS-CoV-2 viral copies this assay can detect is 138 copies/mL. A negative result does not preclude SARS-Cov-2 infection and should not be used as the sole basis for treatment or other patient management decisions. A negative result may occur with  improper specimen collection/handling, submission of specimen other than nasopharyngeal swab, presence of viral mutation(s) within the areas targeted by this assay, and inadequate number of viral copies(<138 copies/mL). A negative result must be combined with clinical observations, patient history, and epidemiological information. The expected result is Negative.  Fact Sheet for Patients:  EntrepreneurPulse.com.au  Fact Sheet for Healthcare Providers:  IncredibleEmployment.be  This test is no t yet approved or cleared by the [place]  FDA and  has been authorized for detection and/or diagnosis of SARS-CoV-2 by FDA under an Emergency Use Authorization (EUA). This EUA will remain  in effect (meaning this test can be used) for the duration of the COVID-19 declaration under Section 564(b)(1) of the Act, 21 U.S.C.section 360bbb-3(b)(1), unless the authorization is terminated  or revoked sooner.       Influenza A by PCR NEGATIVE NEGATIVE Final   Influenza B by PCR NEGATIVE NEGATIVE Final    Comment: (NOTE) The Xpert Xpress SARS-CoV-2/FLU/RSV plus assay is intended as an aid in the diagnosis of influenza from Nasopharyngeal swab specimens and should not be used as a sole basis for treatment. Nasal washings and aspirates are unacceptable for Xpert Xpress SARS-CoV-2/FLU/RSV testing.  Fact Sheet for Patients: EntrepreneurPulse.com.au  Fact Sheet for Healthcare Providers: IncredibleEmployment.be  This test is not yet approved or cleared by the [place] FDA and has been authorized for detection and/or diagnosis of SARS-CoV-2 by FDA under an Emergency Use Authorization (EUA). This EUA will remain in effect (meaning this test can be used) for the duration of the COVID-19 declaration under Section 564(b)(1) of the Act, 21 U.S.C. section 360bbb-3(b)(1), unless the authorization is terminated or revoked.  Performed at [facility], [place] [address], [place], [place] [address]   Urine Culture     Status: Abnormal   Collection Time: 03/19/21  5:20 AM   Specimen: In/Out Cath Urine  Result Value Ref Range Status   Specimen Description IN/OUT CATH URINE  Final   Special Requests   Final    NONE Performed at [facility], [place] [address], [place], [place] [address]    Culture (A)  Final    10,000 COLONIES/mL MULTIPLE SPECIES PRESENT, SUGGEST RECOLLECTION   Report Status 03/20/2021 FINAL  Final  Blood Culture (routine x 2)     Status: None   Collection Time: 03/19/21  5:28  AM   Specimen: BLOOD  Result Value Ref Range Status   Specimen Description BLOOD RIGHT ANTECUBITAL  Final   Special Requests   Final    BOTTLES DRAWN AEROBIC AND ANAEROBIC Blood Culture adequate volume   Culture   Final    NO GROWTH 5 DAYS Performed at [facility], [place] [address] [place], [place] [address]    Report Status 03/24/2021 FINAL  Final  Blood Culture (routine x 2)     Status: None   Collection Time: 03/19/21  5:37 AM   Specimen: BLOOD  RIGHT HAND  Result Value Ref Range Status   Specimen Description BLOOD RIGHT HAND  Final   Special Requests   Final    BOTTLES DRAWN AEROBIC AND ANAEROBIC Blood Culture results may not be optimal due to an inadequate volume of blood received in culture bottles   Culture   Final    NO GROWTH 5 DAYS Performed at [facility], [place] [address], [place], [place] [address]    Report Status 03/24/2021 FINAL  Final         Radiology Studies: No results found.      Scheduled Meds:  amoxicillin-clavulanate  1 tablet Oral Q12H   aspirin EC  81 mg Oral Daily   docusate sodium  100 mg Oral BID   enoxaparin (LOVENOX) injection  40 mg Subcutaneous Q24H   feeding supplement  237 mL Oral TID BM   metoprolol tartrate  25 mg Oral BID   metroNIDAZOLE  500 mg Oral Q8H   multivitamin with minerals  1 tablet Oral Daily   OLANZapine  5 mg Oral QHS   sodium chloride flush  3 mL Intravenous Q12H   tamsulosin  0.4 mg Oral Daily   Continuous Infusions:     LOS: 5 days   Time spent= 35 mins    Nevyn Bossman Arsenio Loader, MD [facility]  If 7PM-7AM, please contact night-coverage  03/25/2021, 8:26 AM

## 2021-03-25 NOTE — Progress Notes (Signed)
Palliative Medicine RN Note: Symptom check.   Family is at bedside; waiting for [place] liaison. Pt was more relaxed today, but Mrs Belli reports that Mr Pavelko was still having agitation when she arrived.  When I walked in, Mr Kissell was attempting to eat/drink and seemed to aspirate. I placed an order for SLP eval for safest consistency, recognizing that he is going home w hospice. I discussed safe feeding methods with his family, including avoiding straws, using spoons instead to drink. We also discussed that SLP may recommend thickening liquids, and we prefer to avoid puree foods if possible, just for texture.  I discussed Mr Oshita's agitation with Dr Hilma Favors, who increased his olanzapine to see if we can get better control of his night time agitation.  Marjie Skiff Savi Lastinger, RN, BSN, [facility] Palliative Medicine Team 03/25/2021 1:21 PM Office [phone]

## 2021-03-25 NOTE — Telephone Encounter (Signed)
Spoke with Jonathan Graham and informed her Dr Elease Hashimoto said yes.

## 2021-03-25 NOTE — Progress Notes (Signed)
Occupational Therapy Treatment Patient Details Name: Jonathan Graham MRN: [id number] DOB: October 23, 1927 Today's Date: 03/25/2021    History of present illness 87 yo male presenting to ED on 03/19/2021 after a fall at home. PMH significant for dementia, arthritis, prostate cancer, heart murmur, heart cath, and back surgery.   OT comments  Pt making slow progress with functional goals. Pt pleasant ly confused, but alert and smiling. Session focused on bed mobility to sit EOB sitting tolerance/balance, simple grooming and UB dressing. Pt unable to stand from EOB x 2 attempts as he was unable to clear hips from bed and ha flexed trunk posture. OT will continue to follow acutely to maximize level of function and safety  Follow Up Recommendations  SNF;Supervision/Assistance - 24 hour    Equipment Recommendations  Other (comment) (TBD at next venue of care)    Recommendations for Other Services      Precautions / Restrictions Precautions Precautions: None Precaution Comments: delirium precautions Restrictions Weight Bearing Restrictions: No       Mobility Bed Mobility Overal bed mobility: Needs Assistance       Supine to sit: Mod assist;HOB elevated Sit to supine: Max assist   General bed mobility comments: mod A with LE mgt and to elevate trunk with multimodal cues for sequencing to sit EOB, max A with LEs back onto bed    Transfers Overall transfer level: Needs assistance Equipment used: 1 person hand held assist             General transfer comment: attempted x 2 to stand from EOB, pt unable to clear hips from bed and with trunk in flexed posture    Balance Overall balance assessment: Needs assistance Sitting-balance support: Feet supported;Bilateral upper extremity supported;Single extremity supported Sitting balance-Leahy Scale: Poor Sitting balance - Comments: static sitting at EOB x 6 minutes with mod A for balance ad safety                                    ADL either performed or assessed with clinical judgement   ADL Overall ADL's : Needs assistance/impaired     Grooming: Wash/dry hands;Wash/dry face;Sitting;Moderate assistance Grooming Details (indicate cue type and reason): Poor sitting balance, mod A for balance/support         Upper Body Dressing : Moderate assistance;Cueing for sequencing;Sitting Upper Body Dressing Details (indicate cue type and reason): Poor sitting balance                   General ADL Comments: Pt sat EOB x 6 minutes with mod A for balance/support for simple grooming and UB dressing tasks, required cues for initiation and sequencing     Vision Baseline Vision/History: Wears glasses Patient Visual Report: No change from baseline     Perception     Praxis      Cognition Arousal/Alertness: Awake/alert Behavior During Therapy: WFL for tasks assessed/performed Overall Cognitive Status: History of cognitive impairments - at baseline                                 General Comments: Baseline dementia        Exercises     Shoulder Instructions       General Comments      Pertinent Vitals/ Pain       Pain Assessment: Faces Faces Pain Scale: Hurts  a little bit Pain Location: R knee Pain Descriptors / Indicators: Discomfort;Grimacing Pain Intervention(s): Monitored during session;Repositioned  Home Living                                          Prior Functioning/Environment              Frequency  Min 2X/week        Progress Toward Goals  OT Goals(current goals can now be found in the care plan section)  Progress towards OT goals: Progressing toward goals  Acute Rehab OT Goals Patient Stated Goal: did not state  Plan Discharge plan remains appropriate    Co-evaluation                 AM-PAC OT "6 Clicks" Daily Activity     Outcome Measure   Help from another person eating meals?: A Little Help from another person  taking care of personal grooming?: A Little Help from another person toileting, which includes using toliet, bedpan, or urinal?: Total Help from another person bathing (including washing, rinsing, drying)?: Total Help from another person to put on and taking off regular upper body clothing?: A Lot Help from another person to put on and taking off regular lower body clothing?: Total 6 Click Score: 11    End of Session    OT Visit Diagnosis: Unsteadiness on feet (R26.81);Muscle weakness (generalized) (M62.81);Other symptoms and signs involving cognitive function;Pain Pain - Right/Left: Right Pain - part of body: Knee   Activity Tolerance Patient limited by fatigue   Patient Left in bed;with call bell/phone within reach;with bed alarm set   Nurse Communication          Time: [id number] OT Time Calculation (min): 16 min  Charges: OT General Charges $OT Visit: 1 Visit OT Treatments $Self Care/Home Management : 8-22 mins    Emmit Alexanders [employer] 03/25/2021, 2:04 PM

## 2021-03-25 NOTE — Progress Notes (Signed)
Manufacturing engineer (ACC)  Spoke with dtr [name], they met with [facility] to determine what and how much help they will need. Dtr is waiting for [facility] to let her know when they can start services, then Mr. Starace can dc home.  Discussed DME again, will order oral suction, 3n1 and transport WC.  Thank you, Venia Carbon RN, BSN, [facility] Liaison

## 2021-03-25 NOTE — Plan of Care (Signed)
  Problem: Activity: Goal: Risk for activity intolerance will decrease Outcome: Progressing   Problem: Nutrition: Goal: Adequate nutrition will be maintained Outcome: Progressing   Problem: Coping: Goal: Level of anxiety will decrease Outcome: Progressing   

## 2021-03-25 NOTE — Evaluation (Signed)
Clinical/Bedside Swallow Evaluation Patient Details  Name: Jonathan Graham MRN: [id number] Date of Birth: 02-Apr-1928  Today's Date: 03/25/2021 Time: SLP Start Time (ACUTE ONLY): [id number] SLP Stop Time (ACUTE ONLY): [id number] SLP Time Calculation (min) (ACUTE ONLY): 46 min  Past Medical History:  Past Medical History:  Diagnosis Date   Arthritis    Atypical chest pain    history of    Back pain    Cancer ([place]) 2014   prostate   Cardiovascular stress test abnormal 04/25/2004   Abnormal adenosine Cardiolite study / evidence of inferoseptal ischemia &  an EF of 48% ""recommended that he undergo cardiac catheterization""   Cervical spine disease    Dementia (HCC)    Dizziness    occasionally apon bending over   DNR (do not resuscitate) 03/19/2021   Fatigue    history of    Heart murmur    History of hiatal hernia    History of mononucleosis    Hypercholesterolemia    Left bundle branch block    Pleurisy 07/2005   S/P radiation therapy  12/05/2012 through 01/30/2013                                                         Prostate 7800 cGy 40 sessions, seminal vesicles 5600 cGy 40 sessions                        Shortness of breath    Spondylosis, cervical    at C3-C4   Varicose veins    Weakness    history of    Past Surgical History:  Past Surgical History:  Procedure Laterality Date   BACK SURGERY  1995    2 ruptured discs in lower back / by Dr. Louanne Skye   CARDIAC CATHETERIZATION  2005   EF estimated at 50% /  PROCEDURE:  Left heart catheterization, coronary and left ventricular angiography /  RAO view demonstrates mild LV enlargement  / mild hypokinesia anteroseptal with  overall low normal LV systolic function / Normal coronary anatomy   HEMORRHOID SURGERY     PROSTATE BIOPSY  08/25/2012   Gleason 3+3=6 PSA 7.80   RIGHT/LEFT HEART CATH AND CORONARY ANGIOGRAPHY N/A 11/13/2016   Procedure: Right/Left Heart Cath and Coronary Angiography;  Surgeon: [name] [name], MD;  Location: [facility];  Service: Cardiovascular;  Laterality: N/A;   TEE WITHOUT CARDIOVERSION N/A 12/08/2016   Procedure: TRANSESOPHAGEAL ECHOCARDIOGRAM (TEE);  Surgeon: Burnell Blanks, MD;  Location: [facility];  Service: Open Heart Surgery;  Laterality: N/A;   TONSILLECTOMY AND ADENOIDECTOMY     TRANSCATHETER AORTIC VALVE REPLACEMENT, TRANSFEMORAL N/A 12/08/2016   Procedure: TRANSCATHETER AORTIC VALVE REPLACEMENT, TRANSFEMORAL;  Surgeon: Burnell Blanks, MD;  Location: [employer] [facility];  Service: Open Heart Surgery;  Laterality: N/A;   HPI:  Pt is a 87 yo male presenting to ED on 03/19/2021 after a fall at home and admitted with sepsis felt to be secondary to proctitis. PMH significant for HH, dementia, arthritis, prostate cancer, heart murmur, heart cath, and back surgery.   Assessment / Plan / Recommendation Clinical Impression  Pt presents with signs of dysphagia, intermittently with signs of aspiration, while consuming thin liquids in particular. He has oral holding and multiple swallows with thin boluses, with the  outward appearance of piecemeal swallowing. When coughing occurs it sounds strong and wet, but it is not productive. Signs of dysphagia are greatly reduced with spoonfuls of thin liquids and any volume administered of nectar thick liquids or purees/solids. He also has no further overt s/s of aspiration. In considering the goal of comfort, pt/family are interested in maintaining a regular diet but with trial of nectar thick liquids. Pt could still have spoonfuls of thin liquids as well. Education was provided about aspiration precautions and strategies to try to reduce mixed consistency boluses, but will plan to f/u for further education and training while he remains inpatient. SLP Visit Diagnosis: Dysphagia, oropharyngeal phase (R13.12)    Aspiration Risk  Mild aspiration risk;Moderate aspiration risk    Diet Recommendation Regular;Nectar-thick liquid;Other (Comment) (pt can also have  spoonfuls of thin liquids)   Liquid Administration via: Cup;Straw (with nectar thick liquids) Medication Administration: Whole meds with puree Supervision: Staff to assist with self feeding Compensations: Minimize environmental distractions;Slow rate;Small sips/bites Postural Changes: Seated upright at 90 degrees    Other  Recommendations Oral Care Recommendations: Oral care BID Other Recommendations: Order thickener from pharmacy   Follow up Recommendations 24 hour supervision/assistance;Other (comment) (planning for home with hospice)      Frequency and Duration min 2x/week  1 week       Prognosis Prognosis for Safe Diet Advancement: Fair Barriers to Reach Goals: Cognitive deficits;Time post onset      Swallow Study   General HPI: Pt is a 87 yo male presenting to ED on 03/19/2021 after a fall at home and admitted with sepsis felt to be secondary to proctitis. PMH significant for HH, dementia, arthritis, prostate cancer, heart murmur, heart cath, and back surgery. Type of Study: Bedside Swallow Evaluation Previous Swallow Assessment: none in chart Diet Prior to this Study: Regular;Thin liquids Temperature Spikes Noted: No Respiratory Status: Room air History of Recent Intubation: No Behavior/Cognition: Alert;Cooperative;Pleasant mood;Requires cueing Oral Cavity Assessment: Within Functional Limits Oral Care Completed by SLP: No Oral Cavity - Dentition: Adequate natural dentition Vision: Functional for self-feeding Self-Feeding Abilities: Needs assist Patient Positioning: Upright in bed Baseline Vocal Quality: Normal Volitional Cough: Strong;Congested Volitional Swallow: Able to elicit    Oral/Motor/Sensory Function Overall Oral Motor/Sensory Function: Within functional limits   Ice Chips Ice chips: Not tested   Thin Liquid Thin Liquid: Impaired Presentation: Cup;Self Fed;Spoon;Straw Oral Phase Functional Implications: Oral holding Pharyngeal  Phase Impairments: Cough -  Immediate;Multiple swallows    Nectar Thick Nectar Thick Liquid: Impaired Presentation: Cup;Self Fed;Straw Oral phase functional implications: Oral holding   Honey Thick Honey Thick Liquid: Not tested   Puree Puree: Impaired Presentation: Spoon Pharyngeal Phase Impairments: Multiple swallows   Solid     Solid: Within functional limits Presentation: Self Fed      Osie Bond., M.A. [address] Pager [phone] Office [phone]  03/25/2021,2:31 PM

## 2021-03-25 NOTE — Plan of Care (Signed)
?  Problem: Clinical Measurements: ?Goal: Diagnostic test results will improve ?Outcome: Progressing ?  ?Problem: Safety: ?Goal: Ability to remain free from injury will improve ?Outcome: Progressing ?  ?

## 2021-03-26 ENCOUNTER — Other Ambulatory Visit (HOSPITAL_COMMUNITY): Payer: Self-pay

## 2021-03-26 DIAGNOSIS — A419 Sepsis, unspecified organism: Secondary | ICD-10-CM | POA: Diagnosis not present

## 2021-03-26 MED ORDER — METOPROLOL TARTRATE 25 MG PO TABS
25.0000 mg | ORAL_TABLET | Freq: Two times a day (BID) | ORAL | 0 refills | Status: AC
  Filled 2021-03-26: qty 60, 30d supply, fill #0

## 2021-03-26 MED ORDER — METRONIDAZOLE 500 MG PO TABS
500.0000 mg | ORAL_TABLET | Freq: Three times a day (TID) | ORAL | 0 refills | Status: AC
  Filled 2021-03-26: qty 6, 2d supply, fill #0

## 2021-03-26 MED ORDER — BISACODYL EC 5 MG PO TBEC
5.0000 mg | DELAYED_RELEASE_TABLET | Freq: Every day | ORAL | 0 refills | Status: AC | PRN
  Filled 2021-03-26: qty 30, 30d supply, fill #0

## 2021-03-26 MED ORDER — OLANZAPINE 10 MG PO TABS
10.0000 mg | ORAL_TABLET | Freq: Every day | ORAL | 0 refills | Status: AC
  Filled 2021-03-26: qty 30, 30d supply, fill #0

## 2021-03-26 MED ORDER — AMOXICILLIN-POT CLAVULANATE 875-125 MG PO TABS
1.0000 | ORAL_TABLET | Freq: Two times a day (BID) | ORAL | 0 refills | Status: AC
  Filled 2021-03-26: qty 4, 2d supply, fill #0

## 2021-03-26 MED ORDER — OXYCODONE HCL 5 MG PO TABS
5.0000 mg | ORAL_TABLET | ORAL | 0 refills | Status: AC | PRN
  Filled 2021-03-26: qty 30, 5d supply, fill #0

## 2021-03-26 MED ORDER — ONDANSETRON HCL 4 MG PO TABS
4.0000 mg | ORAL_TABLET | Freq: Four times a day (QID) | ORAL | 0 refills | Status: AC | PRN
  Filled 2021-03-26: qty 20, 5d supply, fill #0

## 2021-03-26 NOTE — TOC Transition Note (Signed)
Transition of Care [facility]) - CM/SW Discharge Note   Patient Details  Name: Jonathan Graham MRN: [id number] Date of Birth: May 05, 1928  Transition of Care [facility]) CM/SW Contact:  Ninfa Meeker, RN Phone Number: 03/26/2021, 11:29 AM   Clinical Narrative:  Case manager spoke with patient's daughter Jonathan Graham [phone], to confirm that everything was in place t patient's home. Jonathan Graham contacted [employer], they will begin services at 3 pm today. Case Manager has arranged for [facility] transport pickup at 1 pm today.Patient's fmily is aware. CM will notify bedside RN.     Final next level of care: Home w Hospice Care Barriers to Discharge: Continued Medical Work up   Patient Goals and CMS Choice Patient states their goals for this hospitalization and ongoing recovery are:: Family wants home with Hospice      Discharge Placement                       Discharge Plan and Services   Discharge Planning Services: CM Consult Post Acute Care Choice: NA                      [facility] Agency: Hospice and [place] Date [place]: 03/24/21 Time [place]: 1129 Representative spoke with at [place]: [place] ([place]) Interventions     Readmission Risk Interventions No flowsheet data found.

## 2021-03-26 NOTE — Progress Notes (Signed)
  Speech Language Pathology Treatment: Dysphagia  Patient Details Name: Jonathan Graham MRN: [id number] DOB: June 21, 1928 Today's Date: 03/26/2021 Time: [id number] SLP Time Calculation (min) (ACUTE ONLY): 17 min  Assessment / Plan / Recommendation Clinical Impression  Pt was sleeping soundly upon SLP arrival. He woke up briefly and was offered pos, but quickly fell back to sleep. PO trials were deferred to allow pt to rest, with tx instead focused on family education. Pt's daughter and wife had just left to prepare his home for his arrival, but were reached via phone. They said that they had not seen any PO intake today, but they believes the sips of drink that he had after our evaluation yesterday seemed to be better. SLP provided a box containing thickener and a 5cc provale cup, which were both placed in the pt's bag per their request so that they will return home with him. Education was reviewed on provale cup in particular, as thickener had been touched upon on previous date. Encouraged them to call back with any questions prior to d/c. He will also have f/u from hospice upon return home. Will remain available for any questions.    HPI HPI: Pt is a 87 yo male presenting to ED on 03/19/2021 after a fall at home and admitted with sepsis felt to be secondary to proctitis. PMH significant for HH, dementia, arthritis, prostate cancer, heart murmur, heart cath, and back surgery.      SLP Plan  Continue with current plan of care       Recommendations  Diet recommendations: Regular;Nectar-thick liquid;Other(comment) (can also have sips of thin liquids by spoon or in provale cup) Liquids provided via: Cup;Straw (with nectar thick lqiuids) Medication Administration: Whole meds with puree Supervision: Staff to assist with self feeding;Full supervision/cueing for compensatory strategies Compensations: Minimize environmental distractions;Slow rate;Small sips/bites Postural Changes and/or Swallow  Maneuvers: Seated upright 90 degrees                Oral Care Recommendations: Oral care BID Follow up Recommendations: 24 hour supervision/assistance;Other (comment) (home with hospice) SLP Visit Diagnosis: Dysphagia, oropharyngeal phase (R13.12) Plan: Continue with current plan of care       GO                Osie Bond., M.A. [facility] Pager [phone] Office [phone]  03/26/2021, 11:07 AM

## 2021-03-26 NOTE — Progress Notes (Signed)
DISCHARGE NOTE HOME Jonathan Graham to be discharged Home per MD order. Discussed prescriptions and follow up appointments with the patient. Prescriptions given to patient; medication list explained in detail. Patient verbalized understanding.  Skin clean, dry and intact without evidence of skin break down, no evidence of skin tears noted. IV catheter discontinued intact. Site without signs and symptoms of complications. Dressing and pressure applied. Pt denies pain at the site currently. No complaints noted.  Patient free of lines, drains, and wounds.   An After Visit Summary (AVS) was printed and given to the patient. Patient escorted via stretcher, and discharged home via [place].  [place] Nylund S Shermaine Brigham, RN

## 2021-03-26 NOTE — Discharge Summary (Signed)
Physician Discharge Summary  Jonathan Graham [id number] DOB: 1928-03-27 DOA: 03/19/2021  PCP: [name], MD  Admit date: 03/19/2021 Discharge date: 03/26/2021  Admitted From: Home Disposition:  Home  Recommendations for Outpatient Follow-up:  Follow up with PCP in 1-2 weeks Please obtain BMP/CBC in one week your next doctors visit.  Oral Augmentin and Flagyl for 2 more days Olanzapine 10 mg at bedtime Oxycodone for pain, bowel regimen as needed Zofran as necessary  Home Health: Equipment/Devices: Discharge Condition: Stable CODE STATUS:  Diet recommendation:   Brief/Interim Summary: 87 year old male with history of prostate cancer status post XRT, dementia, history of TAVR, hyperlipidemia comes in with a fall.  Wife reports that he has been weaker, started to have severe shaking, felt like he needed to get up and walk but had no energy to do so.  Family called EMS, he was febrile and was brought to the hospital.  He was febrile in the ED to 102, has been having intermittent constipation, and a CT scan of the abdomen pelvis showed proctitis as a potential source.  He was placed on antibiotics and admitted to the hospital.  Initially patient started on cefepime and Flagyl.  Developed hospital delirium therefore started on Seroquel.  PT recommended SNF.  Palliative care team following the patient. Family decided for home with Hospice.  Arrangements were made.  Daughter updated on the day of discharge.   Assessment & Plan:   Principal Problem:   Sepsis due to undetermined organism [facility]) Active Problems:   Hypercholesterolemia   Prostate cancer ([place])   Chronic systolic CHF (congestive heart failure) (HCC)   History of transcatheter aortic valve replacement (TAVR)   Elevated troponin   Dementia without behavioral disturbance ([place])   Malnutrition ([place])   DNR (do not resuscitate)   Sepsis ([place])   Pressure injury of skin   Protein-calorie malnutrition, severe   Delirium    Encounter for hospice care discussion   Palliative care encounter       Sepsis, presumed due to proctitis - -Sepsis physiology improving.  CT abdomen pelvis consistent with proctitis. - Narrow antibiotics-p.o. Augmentin last day 7/29   In hospital delirium, acute metabolic encephalopathy with underlying dementia - Olanzapine 10 mg p.o. nightly   A. fib with RVR- -Rate controlled Lopressor prn   Chronic systolic CHF  -seen by cardiology as an outpatient, most recently echo showed EF 30-35%, unchanged.  He is on no meds due to chronic hypotension but does not seem to have this issue here.  Tolerated fluid bolus in the ED well, hold additional fluids given that he is normotensive   Aortic valve stenosis -status post TAVR   Elevated troponin -demand ischemia.  Remains chest pain-free.   Malnutrition, goals of care -transition to home with hospice.   Severe protein calorie malnutrition Nutritional status   Nutrition Problem: Severe Malnutrition Etiology: chronic illness (dementia)   Signs/Symptoms: severe fat depletion, severe muscle depletion   Interventions: Ensure Enlive (each supplement provides 350kcal and 20 grams of protein), Magic cup, MVI   Body mass index is 20.36 kg/m. Patient going home with hospice    Body mass index is 18.3 kg/m.  Pressure Injury 03/20/21 Sacrum Mid Stage 1 -  Intact skin with non-blanchable redness of a localized area usually over a bony prominence. reddened blanchable area sacral (Active)  03/20/21 1730  Location: Sacrum  Location Orientation: Mid  Staging: Stage 1 -  Intact skin with non-blanchable redness of a localized area usually over a  bony prominence.  Wound Description (Comments): reddened blanchable area sacral  Present on Admission: Yes        Discharge Diagnoses:  Principal Problem:   Sepsis due to undetermined organism [facility]) Active Problems:   Hypercholesterolemia   Prostate cancer ([name])   Chronic systolic CHF  (congestive heart failure) (HCC)   History of transcatheter aortic valve replacement (TAVR)   Elevated troponin   Dementia without behavioral disturbance ([name])   Malnutrition ([name])   DNR (do not resuscitate)   Sepsis ([place])   Pressure injury of skin   Protein-calorie malnutrition, severe   Delirium   Encounter for hospice care discussion   Palliative care encounter  Subjective: No complaints.  Attempting to get out of bed this morning.  Discharge Exam: Vitals:   03/26/21 0554 03/26/21 0948  BP: 124/86 129/81  Pulse: (!) 115 (!) 108  Resp: 15 18  Temp: 98.4 F (36.9 C) 98.3 F (36.8 C)  SpO2: 96% 98%   Vitals:   03/25/21 1015 03/25/21 2101 03/26/21 0554 03/26/21 0948  BP:  115/70 124/86 129/81  Pulse: 88 99 (!) 115 (!) 108  Resp:  '15 15 18  '$ Temp:  98.3 F (36.8 C) 98.4 F (36.9 C) 98.3 F (36.8 C)  TempSrc:      SpO2: 96% 95% 96% 98%  Weight:  62.9 kg    Height:        General: Pt is alert, awake, not in acute distress Cardiovascular: RRR, S1/S2 +, no rubs, no gallops Respiratory: CTA bilaterally, no wheezing, no rhonchi Abdominal: Soft, NT, ND, bowel sounds + Extremities: no edema, no cyanosis  Discharge Instructions   Allergies as of 03/26/2021       Reactions   Lipitor [atorvastatin Calcium] Other (See Comments)   MYALGIAS CRAMPS   Lisinopril Cough        Medication List     TAKE these medications    amoxicillin 500 MG capsule Commonly known as: AMOXIL Take 4 tablets(2000 mg) for dental work only   amoxicillin-clavulanate 875-125 MG tablet Commonly known as: AUGMENTIN Take 1 tablet by mouth every 12 (twelve) hours for 2 days.   aspirin EC 81 MG tablet Take 81 mg by mouth daily.   bisacodyl 5 MG EC tablet Generic drug: bisacodyl Take 1 tablet (5 mg total) by mouth daily as needed for moderate constipation.   metoprolol tartrate 25 MG tablet Commonly known as: LOPRESSOR Take 1 tablet (25 mg total) by mouth 2 (two) times daily.    metroNIDAZOLE 500 MG tablet Commonly known as: FLAGYL Take 1 tablet (500 mg total) by mouth every 8 (eight) hours for 2 days.   naproxen sodium 220 MG tablet Commonly known as: ALEVE Take 220 mg by mouth daily as needed (mild pain).   OLANZapine 10 MG tablet Commonly known as: ZYPREXA Take 1 tablet (10 mg total) by mouth at bedtime.   ondansetron 4 MG tablet Commonly known as: ZOFRAN Take 1 tablet (4 mg total) by mouth every 6 (six) hours as needed for nausea.   oxyCODONE 5 MG immediate release tablet Commonly known as: Oxy IR/ROXICODONE Take 1 tablet (5 mg total) by mouth every 4 (four) hours as needed for moderate pain.   polyethylene glycol powder 17 GM/SCOOP powder Commonly known as: GLYCOLAX/MIRALAX Take 17 g by mouth daily as needed for constipation.   PROBIOTIC PO Take 1 tablet by mouth daily.       ASK your doctor about these medications    [employer] Senna Plus  8.6-50 MG tablet Generic drug: senna-docusate TAKE 2 TABLETS BY MOUTH 2 (TWO) TIMES DAILY. OR AS DIRECTED BY HOSPICE NURSE   tamsulosin 0.4 MG Caps capsule Commonly known as: FLOMAX Take 1 capsule (0.4 mg total) by mouth at bedtime.               Durable Medical Equipment  (From admission, onward)           Start     Ordered   03/26/21 0849  For home use only DME wheelchair cushion (seat and back)  Once        03/26/21 0848   03/26/21 0848  For home use only DME 3 n 1  Once        03/26/21 0848            Follow-up Information     Bur[place]te, Alinda Sierras, MD Follow up in 1 week(s).   Specialty: Family Medicine Contact information: [place] [address] [phone]         [name], [name], MD .   Specialty: Cardiology Contact information: [address] [place] [address] [phone]                Allergies  Allergen Reactions   Lipitor [Atorvastatin Calcium] Other (See Comments)    MYALGIAS CRAMPS   Lisinopril Cough     You were cared for by a hospitalist during your hospital stay. If you have any questions about your discharge medications or the care you received while you were in the hospital after you are discharged, you can call the unit and asked to speak with the hospitalist on call if the hospitalist that took care of you is not available. Once you are discharged, your primary care physician will handle any further medical issues. Please note that no refills for any discharge medications will be authorized once you are discharged, as it is imperative that you return to your primary care physician (or establish a relationship with a primary care physician if you do not have one) for your aftercare needs so that they can reassess your need for medications and monitor your lab values.   Procedures/Studies: CT HEAD WO CONTRAST  Result Date: 03/19/2021 CLINICAL DATA:  Lower extremity weakness EXAM: CT HEAD WITHOUT CONTRAST TECHNIQUE: Contiguous axial images were obtained from the base of the skull through the vertex without intravenous contrast. COMPARISON:  10/12/2007 FINDINGS: Brain: No evidence of acute infarction, hemorrhage, hydrocephalus, extra-axial collection or mass lesion/mass effect. Mild atrophic and chronic white matter ischemic changes are noted. Vascular: No hyperdense vessel or unexpected calcification. Skull: Normal. Negative for fracture or focal lesion. Sinuses/Orbits: Paranasal sinuses are within normal limits. Minimal fluid is noted within the left mastoid air cells posteriorly. Other: None. IMPRESSION: Chronic atrophic and ischemic changes without acute intracranial abnormality. Mild left mastoid air cell effusion. Electronically Signed   By: Inez Catalina M.D.   On: 03/19/2021 08:15   CT ABDOMEN PELVIS W CONTRAST  Result Date: 03/19/2021 CLINICAL DATA:  Constipation EXAM: CT ABDOMEN AND PELVIS WITH CONTRAST TECHNIQUE: Multidetector CT imaging of the abdomen and pelvis was performed using the  standard protocol following bolus administration of intravenous contrast. CONTRAST:  178m OMNIPAQUE IOHEXOL 300 MG/ML  SOLN COMPARISON:  11/30/2016 FINDINGS: Lower chest: Bibasilar atelectatic changes are noted. Findings of prior TAVR are seen. Hepatobiliary: Stable cysts are again identified within the right lobe of the liver. The gallbladder is well distended without significant wall thickening or evidence  of cholelithiasis. Pancreas: Pancreas is well visualized. Area of decreased attenuation is again identified but less prominent than that seen on the prior exam and appears to represent a an area of focal fatty infiltration. Spleen: Normal in size without focal abnormality. Adrenals/Urinary Tract: Adrenal glands are within normal limits. Kidneys demonstrate a normal enhancement pattern bilaterally. No renal calculi or obstructive changes are seen. Normal excretion is noted on delayed images. A small less than 1 cm cystic lesion is noted within the right kidney. No obstructive changes are seen. The bladder is well distended. Stomach/Bowel: Mild rectal wall thickening is noted which may represent and focal degree of proctitis. Diverticular change of the colon is again identified without evidence of diverticulitis. No obstructive or inflammatory changes of the colon are seen. The appendix is within normal limits. Small bowel is unremarkable. Stomach is decompressed. Vascular/Lymphatic: Aortic atherosclerosis. No enlarged abdominal or pelvic lymph nodes. Reproductive: Prostate is unremarkable. Fiducial markers are noted within the prostate. Other: No abdominal wall hernia or abnormality. No abdominopelvic ascites. Musculoskeletal: Chronic appearing T12 compression deformity is noted. This is new however from the prior exam of 2018. Degenerative changes of lumbar spine are noted. IMPRESSION: Rectal wall thickening consistent with a degree of proctitis. No constipation is identified. Hypodensity is again noted in the  midportion the pancreas although less prominent than that seen on the prior exam and has the appearance of an area of focal fatty infiltration. Given its long-term stability and regression as well as the patient's age, it is felt to be not clinically significant. Chronic appearing T12 compression deformity. No other focal abnormality is noted. Electronically Signed   By: Inez Catalina M.D.   On: 03/19/2021 08:21   DG Chest Port 1 View  Result Date: 03/19/2021 CLINICAL DATA:  Questionable sepsis EXAM: PORTABLE CHEST 1 VIEW COMPARISON:  12/09/2016 FINDINGS: Normal heart size and stable mediastinal contours. Transcatheter aortic valve replacement. No convincing pneumonia. No edema, effusion, or pneumothorax. IMPRESSION: No focal pneumonia. Electronically Signed   By: Monte Fantasia M.D.   On: 03/19/2021 05:43     The results of significant diagnostics from this hospitalization (including imaging, microbiology, ancillary and laboratory) are listed below for reference.     Microbiology: Recent Results (from the past 240 hour(s))  Resp Panel by RT-PCR (Flu A&B, Covid) Nasopharyngeal Swab     Status: None   Collection Time: 03/19/21  5:16 AM   Specimen: Nasopharyngeal Swab; Nasopharyngeal(NP) swabs in vial transport medium  Result Value Ref Range Status   SARS Coronavirus 2 by RT PCR NEGATIVE NEGATIVE Final    Comment: (NOTE) SARS-CoV-2 target nucleic acids are NOT DETECTED.  The SARS-CoV-2 RNA is generally detectable in upper respiratory specimens during the acute phase of infection. The lowest concentration of SARS-CoV-2 viral copies this assay can detect is 138 copies/mL. A negative result does not preclude SARS-Cov-2 infection and should not be used as the sole basis for treatment or other patient management decisions. A negative result may occur with  improper specimen collection/handling, submission of specimen other than nasopharyngeal swab, presence of viral mutation(s) within the areas  targeted by this assay, and inadequate number of viral copies(<138 copies/mL). A negative result must be combined with clinical observations, patient history, and epidemiological information. The expected result is Negative.  Fact Sheet for Patients:  EntrepreneurPulse.com.au  Fact Sheet for Healthcare Providers:  IncredibleEmployment.be  This test is no t yet approved or cleared by the [place] FDA and  has been authorized for detection  and/or diagnosis of SARS-CoV-2 by FDA under an Emergency Use Authorization (EUA). This EUA will remain  in effect (meaning this test can be used) for the duration of the COVID-19 declaration under Section 564(b)(1) of the Act, 21 U.S.C.section 360bbb-3(b)(1), unless the authorization is terminated  or revoked sooner.       Influenza A by PCR NEGATIVE NEGATIVE Final   Influenza B by PCR NEGATIVE NEGATIVE Final    Comment: (NOTE) The Xpert Xpress SARS-CoV-2/FLU/RSV plus assay is intended as an aid in the diagnosis of influenza from Nasopharyngeal swab specimens and should not be used as a sole basis for treatment. Nasal washings and aspirates are unacceptable for Xpert Xpress SARS-CoV-2/FLU/RSV testing.  Fact Sheet for Patients: EntrepreneurPulse.com.au  Fact Sheet for Healthcare Providers: IncredibleEmployment.be  This test is not yet approved or cleared by the [place] FDA and has been authorized for detection and/or diagnosis of SARS-CoV-2 by FDA under an Emergency Use Authorization (EUA). This EUA will remain in effect (meaning this test can be used) for the duration of the COVID-19 declaration under Section 564(b)(1) of the Act, 21 U.S.C. section 360bbb-3(b)(1), unless the authorization is terminated or revoked.  Performed at [facility], [place] [address], [place], [place] [address]   Urine Culture     Status: Abnormal   Collection Time: 03/19/21   5:20 AM   Specimen: In/Out Cath Urine  Result Value Ref Range Status   Specimen Description IN/OUT CATH URINE  Final   Special Requests   Final    NONE Performed at [facility], [place] [address], [place], [place] [address]    Culture (A)  Final    10,000 COLONIES/mL MULTIPLE SPECIES PRESENT, SUGGEST RECOLLECTION   Report Status 03/20/2021 FINAL  Final  Blood Culture (routine x 2)     Status: None   Collection Time: 03/19/21  5:28 AM   Specimen: BLOOD  Result Value Ref Range Status   Specimen Description BLOOD RIGHT ANTECUBITAL  Final   Special Requests   Final    BOTTLES DRAWN AEROBIC AND ANAEROBIC Blood Culture adequate volume   Culture   Final    NO GROWTH 5 DAYS Performed at [facility], [place] [address] [place], [place] [address]    Report Status 03/24/2021 FINAL  Final  Blood Culture (routine x 2)     Status: None   Collection Time: 03/19/21  5:37 AM   Specimen: BLOOD RIGHT HAND  Result Value Ref Range Status   Specimen Description BLOOD RIGHT HAND  Final   Special Requests   Final    BOTTLES DRAWN AEROBIC AND ANAEROBIC Blood Culture results may not be optimal due to an inadequate volume of blood received in culture bottles   Culture   Final    NO GROWTH 5 DAYS Performed at [facility], [place] [address], [place], [place] [address]    Report Status 03/24/2021 FINAL  Final     Labs: BNP (last 3 results) No results for input(s): BNP in the last 8760 hours. Basic Metabolic Panel: Recent Labs  Lab 03/20/21 0500 03/21/21 0231 03/22/21 0054 03/23/21 0243 03/25/21 0013  NA 138 136 137 135 134*  K 3.7 3.8 3.5 4.0 4.1  CL 113* 110 111 109 102  CO2 18* 17* 17* 18* 23  GLUCOSE 83 96 95 123* 118*  BUN '21 22 20 19 16  '$ CREATININE 0.97 1.20 1.06 0.95 0.80  CALCIUM 8.1* 8.5* 8.3* 8.3* 8.4*  MG  --   --   --  1.7 1.8   Liver Function Tests: No results for input(s): AST, ALT, ALKPHOS, BILITOT, PROT, ALBUMIN in the last 168 hours. No results for input(s):  LIPASE, AMYLASE in the last 168 hours. No results for input(s): AMMONIA in the last 168 hours. CBC: Recent Labs  Lab 03/20/21 0500 03/21/21 0231 03/22/21 0054  WBC 11.4* 9.1 9.0  HGB 10.4* 11.1* 11.4*  HCT 31.8* 34.3* 34.5*  MCV 90.9 88.4 87.6  PLT 127* 153 160   Cardiac Enzymes: No results for input(s): CKTOTAL, CKMB, CKMBINDEX, TROPONINI in the last 168 hours. BNP: Invalid input(s): POCBNP CBG: No results for input(s): GLUCAP in the last 168 hours. D-Dimer No results for input(s): DDIMER in the last 72 hours. Hgb A1c No results for input(s): HGBA1C in the last 72 hours. Lipid Profile No results for input(s): CHOL, HDL, LDLCALC, TRIG, CHOLHDL, LDLDIRECT in the last 72 hours. Thyroid function studies No results for input(s): TSH, T4TOTAL, T3FREE, THYROIDAB in the last 72 hours.  Invalid input(s): FREET3 Anemia work up No results for input(s): VITAMINB12, FOLATE, FERRITIN, TIBC, IRON, RETICCTPCT in the last 72 hours. Urinalysis    Component Value Date/Time   COLORURINE YELLOW 03/19/2021 0520   APPEARANCEUR CLEAR 03/19/2021 0520   LABSPEC 1.015 03/19/2021 0520   PHURINE 5.0 03/19/2021 0520   GLUCOSEU NEGATIVE 03/19/2021 0520   HGBUR SMALL (A) 03/19/2021 0520   BILIRUBINUR NEGATIVE 03/19/2021 0520   BILIRUBINUR n 06/02/2013 1019   KETONESUR NEGATIVE 03/19/2021 0520   PROTEINUR NEGATIVE 03/19/2021 0520   UROBILINOGEN 0.2 06/24/2014 0108   NITRITE NEGATIVE 03/19/2021 0520   LEUKOCYTESUR NEGATIVE 03/19/2021 0520   Sepsis Labs Invalid input(s): PROCALCITONIN,  WBC,  LACTICIDVEN Microbiology Recent Results (from the past 240 hour(s))  Resp Panel by RT-PCR (Flu A&B, Covid) Nasopharyngeal Swab     Status: None   Collection Time: 03/19/21  5:16 AM   Specimen: Nasopharyngeal Swab; Nasopharyngeal(NP) swabs in vial transport medium  Result Value Ref Range Status   SARS Coronavirus 2 by RT PCR NEGATIVE NEGATIVE Final    Comment: (NOTE) SARS-CoV-2 target nucleic acids are  NOT DETECTED.  The SARS-CoV-2 RNA is generally detectable in upper respiratory specimens during the acute phase of infection. The lowest concentration of SARS-CoV-2 viral copies this assay can detect is 138 copies/mL. A negative result does not preclude SARS-Cov-2 infection and should not be used as the sole basis for treatment or other patient management decisions. A negative result may occur with  improper specimen collection/handling, submission of specimen other than nasopharyngeal swab, presence of viral mutation(s) within the areas targeted by this assay, and inadequate number of viral copies(<138 copies/mL). A negative result must be combined with clinical observations, patient history, and epidemiological information. The expected result is Negative.  Fact Sheet for Patients:  EntrepreneurPulse.com.au  Fact Sheet for Healthcare Providers:  IncredibleEmployment.be  This test is no t yet approved or cleared by the [place] FDA and  has been authorized for detection and/or diagnosis of SARS-CoV-2 by FDA under an Emergency Use Authorization (EUA). This EUA will remain  in effect (meaning this test can be used) for the duration of the COVID-19 declaration under Section 564(b)(1) of the Act, 21 U.S.C.section 360bbb-3(b)(1), unless the authorization is terminated  or revoked sooner.       Influenza A by PCR NEGATIVE NEGATIVE Final   Influenza B by PCR NEGATIVE NEGATIVE Final    Comment: (NOTE) The Xpert Xpress SARS-CoV-2/FLU/RSV plus assay is intended as an aid in the diagnosis of influenza from  Nasopharyngeal swab specimens and should not be used as a sole basis for treatment. Nasal washings and aspirates are unacceptable for Xpert Xpress SARS-CoV-2/FLU/RSV testing.  Fact Sheet for Patients: EntrepreneurPulse.com.au  Fact Sheet for Healthcare Providers: IncredibleEmployment.be  This test is not  yet approved or cleared by the [place] FDA and has been authorized for detection and/or diagnosis of SARS-CoV-2 by FDA under an Emergency Use Authorization (EUA). This EUA will remain in effect (meaning this test can be used) for the duration of the COVID-19 declaration under Section 564(b)(1) of the Act, 21 U.S.C. section 360bbb-3(b)(1), unless the authorization is terminated or revoked.  Performed at [facility], [place] [address], [place], [place] [address]   Urine Culture     Status: Abnormal   Collection Time: 03/19/21  5:20 AM   Specimen: In/Out Cath Urine  Result Value Ref Range Status   Specimen Description IN/OUT CATH URINE  Final   Special Requests   Final    NONE Performed at [facility], [place] [address] [place], [place] [address]    Culture (A)  Final    10,000 COLONIES/mL MULTIPLE SPECIES PRESENT, SUGGEST RECOLLECTION   Report Status 03/20/2021 FINAL  Final  Blood Culture (routine x 2)     Status: None   Collection Time: 03/19/21  5:28 AM   Specimen: BLOOD  Result Value Ref Range Status   Specimen Description BLOOD RIGHT ANTECUBITAL  Final   Special Requests   Final    BOTTLES DRAWN AEROBIC AND ANAEROBIC Blood Culture adequate volume   Culture   Final    NO GROWTH 5 DAYS Performed at [facility], [place] [address], [place], [place] [address]    Report Status 03/24/2021 FINAL  Final  Blood Culture (routine x 2)     Status: None   Collection Time: 03/19/21  5:37 AM   Specimen: BLOOD RIGHT HAND  Result Value Ref Range Status   Specimen Description BLOOD RIGHT HAND  Final   Special Requests   Final    BOTTLES DRAWN AEROBIC AND ANAEROBIC Blood Culture results may not be optimal due to an inadequate volume of blood received in culture bottles   Culture   Final    NO GROWTH 5 DAYS Performed at [facility], [place] [address], [place], [place] [address]    Report Status 03/24/2021 FINAL  Final     Time coordinating discharge:  I have  spent 35 minutes face to face with the patient and on the ward discussing the patients care, assessment, plan and disposition with other care givers. >50% of the time was devoted counseling the patient about the risks and benefits of treatment/Discharge disposition and coordinating care.   SIGNED:   Damita Lack, MD  [facility] 03/26/2021, 11:39 AM   If 7PM-7AM, please contact night-coverage

## 2021-03-28 ENCOUNTER — Telehealth: Payer: Self-pay

## 2021-03-28 NOTE — Telephone Encounter (Signed)
Transition Care Management Unsuccessful Follow-up Telephone Call  Date of discharge and from where:  [facility]   03/26/2021  Attempts:  2nd Attempt  Reason for unsuccessful TCM follow-up call:  No answer/busy

## 2021-04-02 ENCOUNTER — Telehealth: Payer: Self-pay | Admitting: Family Medicine

## 2021-04-02 NOTE — Telephone Encounter (Signed)
[place] Name: [place] Number: [phone] Patient Name:Jonathan Graham Patient DOB: [birth year] Reason for Call Death Notification Initial Comment: Caller wants to send death notification of a patient. Additional Comment 0636 pm tonight is the official time of death and she states she doesn't need a callback unless someone needs something from her.

## 2021-04-04 ENCOUNTER — Telehealth: Payer: Self-pay | Admitting: Family Medicine

## 2021-04-04 NOTE — Telephone Encounter (Signed)
Jonathan Graham from [facility] and [facility] called regarding the status of the death certificate for the patient.   Jonathan Graham is wanting to know if Dr. Elease Hashimoto has signed off on certificate yet, because he is not seeing it through [employer].  Mike's contact number is [phone].  Please advise.

## 2021-04-04 NOTE — Telephone Encounter (Signed)
Jonathan Graham informed of the message below.

## 2021-04-21 ENCOUNTER — Ambulatory Visit: Payer: Medicare Other | Admitting: Cardiology

## 2021-05-01 DEATH — deceased

## 2021-06-30 ENCOUNTER — Other Ambulatory Visit (HOSPITAL_COMMUNITY): Payer: Self-pay

## 2021-07-09 ENCOUNTER — Other Ambulatory Visit (HOSPITAL_BASED_OUTPATIENT_CLINIC_OR_DEPARTMENT_OTHER): Payer: Self-pay

## 2021-12-02 ENCOUNTER — Ambulatory Visit: Payer: Medicare Other
